# Patient Record
Sex: Female | Born: 1935 | Race: White | Hispanic: No | Smoking: Never smoker
Health system: Southern US, Community
[De-identification: ages and names within clinical notes are randomized; demographics above are authoritative.]

## PROBLEM LIST (undated history)

## (undated) DIAGNOSIS — I10 Essential (primary) hypertension: Secondary | ICD-10-CM

## (undated) DIAGNOSIS — E039 Hypothyroidism, unspecified: Secondary | ICD-10-CM

## (undated) DIAGNOSIS — R42 Dizziness and giddiness: Secondary | ICD-10-CM

## (undated) DIAGNOSIS — M810 Age-related osteoporosis without current pathological fracture: Secondary | ICD-10-CM

## (undated) HISTORY — PX: TUBAL LIGATION: SHX77

## (undated) HISTORY — PX: EYE SURGERY: SHX253

## (undated) HISTORY — PX: TONSILLECTOMY: SUR1361

---

## 1997-12-22 ENCOUNTER — Other Ambulatory Visit: Admission: RE | Admit: 1997-12-22 | Discharge: 1997-12-22 | Payer: Self-pay | Admitting: Obstetrics and Gynecology

## 1998-11-24 ENCOUNTER — Other Ambulatory Visit: Admission: RE | Admit: 1998-11-24 | Discharge: 1998-11-24 | Payer: Self-pay | Admitting: Internal Medicine

## 2000-01-17 ENCOUNTER — Other Ambulatory Visit: Admission: RE | Admit: 2000-01-17 | Discharge: 2000-01-17 | Payer: Self-pay | Admitting: Obstetrics and Gynecology

## 2000-10-24 ENCOUNTER — Other Ambulatory Visit: Admission: RE | Admit: 2000-10-24 | Discharge: 2000-10-24 | Payer: Self-pay | Admitting: Obstetrics and Gynecology

## 2001-07-25 ENCOUNTER — Ambulatory Visit (HOSPITAL_COMMUNITY): Admission: RE | Admit: 2001-07-25 | Discharge: 2001-07-25 | Payer: Self-pay | Admitting: Obstetrics and Gynecology

## 2002-02-17 ENCOUNTER — Other Ambulatory Visit: Admission: RE | Admit: 2002-02-17 | Discharge: 2002-02-17 | Payer: Self-pay | Admitting: Obstetrics and Gynecology

## 2003-03-05 ENCOUNTER — Other Ambulatory Visit: Admission: RE | Admit: 2003-03-05 | Discharge: 2003-03-05 | Payer: Self-pay | Admitting: Obstetrics and Gynecology

## 2004-04-02 HISTORY — PX: BACK SURGERY: SHX140

## 2004-10-12 ENCOUNTER — Ambulatory Visit: Payer: Self-pay | Admitting: Internal Medicine

## 2004-11-03 ENCOUNTER — Encounter: Admission: RE | Admit: 2004-11-03 | Discharge: 2004-11-03 | Payer: Self-pay | Admitting: Internal Medicine

## 2004-11-21 ENCOUNTER — Inpatient Hospital Stay (HOSPITAL_COMMUNITY): Admission: RE | Admit: 2004-11-21 | Discharge: 2004-11-22 | Payer: Self-pay | Admitting: Neurosurgery

## 2005-03-22 ENCOUNTER — Ambulatory Visit: Payer: Self-pay | Admitting: Internal Medicine

## 2005-04-06 ENCOUNTER — Encounter (INDEPENDENT_AMBULATORY_CARE_PROVIDER_SITE_OTHER): Payer: Self-pay | Admitting: Specialist

## 2005-04-06 ENCOUNTER — Ambulatory Visit: Payer: Self-pay | Admitting: Internal Medicine

## 2006-07-13 ENCOUNTER — Emergency Department (HOSPITAL_COMMUNITY): Admission: EM | Admit: 2006-07-13 | Discharge: 2006-07-14 | Payer: Self-pay | Admitting: Emergency Medicine

## 2006-12-24 ENCOUNTER — Inpatient Hospital Stay (HOSPITAL_COMMUNITY): Admission: EM | Admit: 2006-12-24 | Discharge: 2006-12-25 | Payer: Self-pay | Admitting: Emergency Medicine

## 2007-02-20 ENCOUNTER — Encounter: Admission: RE | Admit: 2007-02-20 | Discharge: 2007-02-20 | Payer: Self-pay | Admitting: Internal Medicine

## 2007-09-08 ENCOUNTER — Ambulatory Visit (HOSPITAL_COMMUNITY): Admission: RE | Admit: 2007-09-08 | Discharge: 2007-09-08 | Payer: Self-pay | Admitting: Obstetrics and Gynecology

## 2008-09-08 ENCOUNTER — Ambulatory Visit (HOSPITAL_COMMUNITY): Admission: RE | Admit: 2008-09-08 | Discharge: 2008-09-08 | Payer: Self-pay | Admitting: Obstetrics and Gynecology

## 2009-04-28 ENCOUNTER — Encounter: Admission: RE | Admit: 2009-04-28 | Discharge: 2009-04-28 | Payer: Self-pay | Admitting: Internal Medicine

## 2009-09-28 ENCOUNTER — Other Ambulatory Visit: Admission: RE | Admit: 2009-09-28 | Discharge: 2009-09-28 | Payer: Self-pay | Admitting: Radiology

## 2009-09-29 ENCOUNTER — Ambulatory Visit (HOSPITAL_COMMUNITY): Admission: RE | Admit: 2009-09-29 | Discharge: 2009-09-29 | Payer: Self-pay | Admitting: Obstetrics and Gynecology

## 2010-03-31 ENCOUNTER — Encounter: Payer: Self-pay | Admitting: Internal Medicine

## 2010-04-23 ENCOUNTER — Encounter: Payer: Self-pay | Admitting: Internal Medicine

## 2010-05-04 NOTE — Letter (Signed)
Summary: Colonoscopy Letter  [facility]  [address] [place], [place] [address]   Phone: [phone]  Fax: [phone]      March 31, 2010 MRN: [id number]   Breanna [name] [address] [place], [place]  [address]   Dear [name],   According to your medical record, it is time for you to schedule a Colonoscopy. The American Cancer Society recommends this procedure as a method to detect early colon cancer. Patients with a family history of colon cancer, or a personal history of colon polyps or inflammatory bowel disease are at increased risk.  This letter has been generated based on the recommendations made at the time of your procedure. If you feel that in your particular situation this may no longer apply, please contact our office.  Please call our office at [phone] to schedule this appointment or to update your records at your earliest convenience.  Thank you for cooperating with [place] to provide you with the very best care possible.   Sincerely,  Wilhemina Bonito. Marina Goodell, M.D.  [facility] [phone]

## 2010-08-15 NOTE — H&P (Signed)
[name], [name] NO.:  [id number]   MEDICAL RECORD NO.:  [id number]          PATIENT TYPE:  EMS   LOCATION:  ED                           FACILITY:  [facility]   PHYSICIAN:  Hollice Espy, M.D.DATE OF BIRTH:  06/10/1930   DATE OF ADMISSION:  12/24/2006  DATE OF DISCHARGE:                              HISTORY & PHYSICAL   PRIMARY CARE PHYSICIAN:  Breanna [name], M.D.   CHIEF COMPLAINT:  Abdominal pain.   HISTORY OF PRESENT ILLNESS:  The patient is a 77 year old, white female  with past medical history of hypertension, hypothyroidism and an episode  of what sounds to be pancreatitis about a year ago, although there are  no dictated records for this, who today was doing well and then had some  fast food which she had not had in a long time.  A few hours later, she  had severe, mid epigastric abdominal pain radiating to her back.  She  had some associated nausea and vomiting.  She came into the emergency  room and was found to have a white count of 18, normal cardiac markers  and a lipase level of 187.  It was suspected that she had acute  pancreatitis.  She was given medications for pain and nausea and since  then has started feeling better.  She now has only some mild nausea and  minimal abdominal discomfort.  She otherwise is doing well denying any  headache, vision changes, dysphagia, chest pain, palpitations, shortness  of breath, wheeze, cough, hematuria, dysuria, constipation, diarrhea,  focal extremity numbness, weakness or pain.  Review of systems otherwise  negative.  In regards to her previous history of pancreatitis, she  remembers them asking about whether she had gallbladder issues or drank  alcohol 1 year ago, but said they could not find a cause and it was, at  that time, much more self-limited.   PAST MEDICAL HISTORY:  1. Onset of pancreatitis, although this is not confirmed, 1 year ago.  2. Hypertension.  3. Hypothyroidism.  4.  Osteoporosis.   MEDICATIONS:  Actonel, Synthroid and a blood pressure pill she cannot  recall.   ALLERGIES:  SULFA.   SOCIAL HISTORY:  She denies any tobacco, heavy alcohol or drug use.   FAMILY HISTORY:  Noncontributory.   PHYSICAL EXAMINATION:  VITAL SIGNS:  Temperature 97.3, heart rate 129  down to 80, blood pressure 152/94, respirations 29, O2 saturations 98%  on room air.  GENERAL:  She is alert and oriented x3, in no distress.  HEENT:  Normocephalic, atraumatic.  Mucous membranes are dry.  She has  no carotid bruits.  HEART:  Regular rate and rhythm, S1, S2.  LUNGS:  Clear to auscultation bilaterally.  ABDOMEN:  Soft, mild generalized tenderness, mild distention, hypoactive  bowel sounds.  EXTREMITIES:  No clubbing, cyanosis or edema, trace pitting edema.   LABORATORY DATA AND X-RAY FINDINGS:  White count 18.1, 88% shift, H&H  13.1 and 38, MCV 95, platelet count 292.  Sodium 139, potassium 3.7,  chloride 102, bicarb 29, BUN 20, creatinine 0.9, glucose 144.  LFTs are  unremarkable.  Lipase  is 187.  CPK 113, MB 5.4, troponin I less than  0.05.  Second set was similar.   ASSESSMENT/PLAN:  Pancreatitis, unclear etiology.  CT scan has been  ordered in the emergency room and will follow up on this.  In addition,  will check serial white count and lipase in 9 hours.  Make the patient  nothing by mouth and put her on intravenous Protonix plus pain and  nausea control.  As far as etiology, I do not feel that this is  hyperlipidemia.  If it was, she would have presented much earlier.  There is no evidence of trauma.  She has not been on any type of new  medications.  She is not a drinker.  This may either be either  gallbladder related, although her Alk phos is normal, versus idiopathic.  We will continue to follow.      Hollice Espy, M.D.  Electronically Signed     SKK/MEDQ  D:  12/24/2006  T:  12/24/2006  Job:  [id number]   cc:   Breanna [name], M.D.  Fax: [phone]

## 2010-08-18 NOTE — Discharge Summary (Signed)
NAMERANDYE, TREICHLER NO.:  [id number]   MEDICAL RECORD NO.:  [id number]          PATIENT TYPE:  INP   LOCATION:  1329                         FACILITY:  [facility]   PHYSICIAN:  Theressa Millard, M.D.    DATE OF BIRTH:  04/19/1930   DATE OF ADMISSION:  12/24/2006  DATE OF DISCHARGE:  12/25/2006                               DISCHARGE SUMMARY   ADMISSION DIAGNOSIS:  Acute pancreatitis.   DISCHARGE DIAGNOSES:  1. Acute pancreatitis.  2. Hypertension.  3. Hypothyroidism care.  4. Osteoporosis.   HOSPITAL COURSE:  The patient is a 77 year old white female who had  sudden onset of abdominal pain and came to the emergency room.  She was  found to have an elevated lipase and was admitted.   HOSPITAL COURSE:  The patient was admitted and a CT scan and ultrasound  revealed no abnormality in the hepatic biliary tree.  There was no  evidence of pseudocyst or significant inflammation of the pancreas.  Overnight, she had remarkable improvement in her pain.  We were able to  advance her diet to clear liquids, full liquids and then regular diet in  the evening, and she was discharge later that evening in improved  condition.  Etiology of pancreatitis is unknown.   DISCHARGE MEDICATIONS:  1. Aspirin 81 mg daily.  2. Actonel 35 mg weekly.  3. Synthroid 88 mcg daily.  4. Lisinopril 10 mg daily.  5. Plendil ER 10 mg daily.   FOLLOWUP:  She will call to make an appointment to see me as scheduled.   DISCHARGE DIET:  Low-fat diet.   ACTIVITY:  As tolerated.      Theressa Millard, M.D.  Electronically Signed     JO/MEDQ  D:  02/05/2007  T:  02/05/2007  Job:  [id number]

## 2010-08-18 NOTE — Op Note (Signed)
Breanna [name], Breanna [name] NO.:  [id number]   MEDICAL RECORD NO.:  [id number]          PATIENT TYPE:  INP   LOCATION:  2854                         FACILITY:  [facility]   PHYSICIAN:  Kathaleen Maser. Pool, M.D.    DATE OF BIRTH:  04/15/1930   DATE OF PROCEDURE:  11/21/2004  DATE OF DISCHARGE:                                 OPERATIVE REPORT   PREOPERATIVE DIAGNOSES:  L2-3 herniated nucleus pulposus with radiculopathy.   POSTOPERATIVE DIAGNOSES:  L2-3 herniated nucleus pulposus with  radiculopathy.   OPERATION PERFORMED:  Right L2-3 laminotomy with microdiskectomy.   SURGEON:  Kathaleen Maser. Pool, M.D.   ASSISTANT:  Reinaldo Meeker, M.D.   ANESTHESIA:  General orotracheal.   INDICATIONS FOR PROCEDURE:  Breanna [name] is a 77 year old female with  history of back and right lower extremity pain, consistent with right-sided  L2-3 radiculopathy.  The patient also has evidence of right quadriceps  wasting and weakness.  Work-ups demonstrate evidence of a focal right-sided  L2-3 disk herniation and inferior free fragment causing compression of the  right-sided L3 nerve root.  The patient has been counseled as to her  options.  She has decided to proceed with a right-sided L2-3 laminotomy and  microdiskectomy in hopes of improving the symptoms.   DESCRIPTION OF PROCEDURE:  The patient was taken to the operating room and  placed on the table in the supine position.  After adequate level of  anesthesia was achieved, the patient was positioned prone onto a Wilson  frame and appropriately padded.  The patient's lumbar region was prepped and  draped sterilely.  A 10 blade was used to make a linear skin incision  overlying the L2-3 interspace.  This was carried down sharply in the  midline.  A subperiosteal dissection was then performed exposing the lamina  and facet joints of L2 and L3 on the right side.  Deep self-retaining  retractor was placed.  Intraoperative x-ray was taken, the level  was  confirmed.  The laminotomy was then performed using high speed drill and  Kerrison rongeurs to remove the inferior aspect of the lamina at L2, medial  aspect of L2-3 facet joint and superior rim of the L3 lamina.  Ligamentum  flavum was then elevated and resected in a piecemeal fashion using Kerrison  rongeurs.  The underlying thecal sac and exiting L3 nerve root were  identified.  The microscope was brought into the field and used for  microdissection of the right-sided L3 nerve root and underlying disk  herniation.  Epidural venous plexus was coagulated and cut.  Thecal sac and  L3 nerve root were mobilized and retracted towards the midline.  Inferior  free fragment was dissected free using micro nerve hooks and removed using  pituitary rongeurs.  The disk space itself was grossly herniated off to the  right side.  The disk was incised with a 15 blade in rectangular fashion.  A  wide disk space cleanout was then achieved using pituitary rongeurs and  upward angled pituitary rongeurs and Epstein curets.  All elements of the  disk herniation were completely resected.  All loose or obviously  degenerated disk material was removed from the interspace.  At this point a  very thorough diskectomy had been performed.  There was no evidence of  injury to thecal sac or nerve roots.  There was no evidence of any residual  compression of the thecal sac or nerve roots.  The wound was then irrigated  with antibiotic solution.  Gelfoam was placed topically for hemostasis which  was found to be good.  Microscope and  retractor system were removed.  Hemostasis in the muscle was achieved with  electrocautery.  The wound was then closed in layers with Vicryl sutures.  Steri-Strips and sterile dressing were applied.  There were no apparent  complications.  The patient tolerated the procedure well and returned to the  recovery room postoperatively.           ______________________________  Kathaleen Maser  Pool, M.D.     HAP/MEDQ  D:  11/21/2004  T:  11/21/2004  Job:  [id number]

## 2010-10-30 ENCOUNTER — Encounter (HOSPITAL_COMMUNITY)
Admission: RE | Admit: 2010-10-30 | Discharge: 2010-10-30 | Disposition: A | Discharge status: Home / Self Care | Payer: Medicare Other | Source: Ambulatory Visit | Attending: Obstetrics and Gynecology | Admitting: Obstetrics and Gynecology

## 2010-10-30 DIAGNOSIS — M810 Age-related osteoporosis without current pathological fracture: Secondary | ICD-10-CM | POA: Insufficient documentation

## 2011-01-11 LAB — CBC
HCT: 34.1 — ABNORMAL LOW
HCT: 38.1
Hemoglobin: 13.1
MCHC: 34.5
Platelets: 258
RBC: 3.99
RDW: 13.4
RDW: 13.4
WBC: 9.5

## 2011-01-11 LAB — URINE MICROSCOPIC-ADD ON

## 2011-01-11 LAB — COMPREHENSIVE METABOLIC PANEL
ALT: 20
Alkaline Phosphatase: 69
BUN: 20
CO2: 29
GFR calc non Af Amer: 59 — ABNORMAL LOW
Glucose, Bld: 144 — ABNORMAL HIGH
Potassium: 3.7
Sodium: 139
Total Protein: 7.1

## 2011-01-11 LAB — DIFFERENTIAL
Basophils Absolute: 0
Basophils Relative: 0
Eosinophils Absolute: 0.1
Monocytes Relative: 4
Neutro Abs: 15.9 — ABNORMAL HIGH
Neutrophils Relative %: 88 — ABNORMAL HIGH

## 2011-01-11 LAB — POCT CARDIAC MARKERS
CKMB, poc: 4.3
CKMB, poc: 5.4
Myoglobin, poc: 113
Myoglobin, poc: 188
Operator id: 4533
Operator id: 4761
Troponin i, poc: <0.05
Troponin i, poc: <0.05

## 2011-01-11 LAB — URINALYSIS, ROUTINE W REFLEX MICROSCOPIC
Glucose, UA: NEGATIVE
Ketones, ur: NEGATIVE
Nitrite: NEGATIVE
Specific Gravity, Urine: >1.046 — ABNORMAL HIGH
pH: 7.5

## 2011-01-11 LAB — LIPASE, BLOOD
Lipase: 187 — ABNORMAL HIGH
Lipase: 35

## 2011-04-10 DIAGNOSIS — Z1231 Encounter for screening mammogram for malignant neoplasm of breast: Secondary | ICD-10-CM | POA: Diagnosis not present

## 2011-05-14 DIAGNOSIS — H353190 Nonexudative age-related macular degeneration, unspecified eye, stage unspecified: Secondary | ICD-10-CM | POA: Diagnosis not present

## 2011-06-15 DIAGNOSIS — S239XXA Sprain of unspecified parts of thorax, initial encounter: Secondary | ICD-10-CM | POA: Diagnosis not present

## 2011-06-15 DIAGNOSIS — M999 Biomechanical lesion, unspecified: Secondary | ICD-10-CM | POA: Diagnosis not present

## 2011-06-19 DIAGNOSIS — M999 Biomechanical lesion, unspecified: Secondary | ICD-10-CM | POA: Diagnosis not present

## 2011-06-19 DIAGNOSIS — S239XXA Sprain of unspecified parts of thorax, initial encounter: Secondary | ICD-10-CM | POA: Diagnosis not present

## 2011-06-22 DIAGNOSIS — S239XXA Sprain of unspecified parts of thorax, initial encounter: Secondary | ICD-10-CM | POA: Diagnosis not present

## 2011-06-22 DIAGNOSIS — M999 Biomechanical lesion, unspecified: Secondary | ICD-10-CM | POA: Diagnosis not present

## 2011-06-25 DIAGNOSIS — M999 Biomechanical lesion, unspecified: Secondary | ICD-10-CM | POA: Diagnosis not present

## 2011-06-25 DIAGNOSIS — S239XXA Sprain of unspecified parts of thorax, initial encounter: Secondary | ICD-10-CM | POA: Diagnosis not present

## 2011-06-28 DIAGNOSIS — M999 Biomechanical lesion, unspecified: Secondary | ICD-10-CM | POA: Diagnosis not present

## 2011-06-28 DIAGNOSIS — S239XXA Sprain of unspecified parts of thorax, initial encounter: Secondary | ICD-10-CM | POA: Diagnosis not present

## 2011-07-03 DIAGNOSIS — M999 Biomechanical lesion, unspecified: Secondary | ICD-10-CM | POA: Diagnosis not present

## 2011-07-03 DIAGNOSIS — S239XXA Sprain of unspecified parts of thorax, initial encounter: Secondary | ICD-10-CM | POA: Diagnosis not present

## 2011-07-05 DIAGNOSIS — L659 Nonscarring hair loss, unspecified: Secondary | ICD-10-CM | POA: Diagnosis not present

## 2011-07-05 DIAGNOSIS — L570 Actinic keratosis: Secondary | ICD-10-CM | POA: Diagnosis not present

## 2011-07-10 DIAGNOSIS — M999 Biomechanical lesion, unspecified: Secondary | ICD-10-CM | POA: Diagnosis not present

## 2011-07-10 DIAGNOSIS — S239XXA Sprain of unspecified parts of thorax, initial encounter: Secondary | ICD-10-CM | POA: Diagnosis not present

## 2011-07-20 DIAGNOSIS — M999 Biomechanical lesion, unspecified: Secondary | ICD-10-CM | POA: Diagnosis not present

## 2011-07-20 DIAGNOSIS — S239XXA Sprain of unspecified parts of thorax, initial encounter: Secondary | ICD-10-CM | POA: Diagnosis not present

## 2011-08-21 DIAGNOSIS — E039 Hypothyroidism, unspecified: Secondary | ICD-10-CM | POA: Diagnosis not present

## 2011-08-21 DIAGNOSIS — Z0000 Encounter for general adult medical examination without abnormal findings: Secondary | ICD-10-CM | POA: Diagnosis not present

## 2011-08-21 DIAGNOSIS — I10 Essential (primary) hypertension: Secondary | ICD-10-CM | POA: Diagnosis not present

## 2011-08-21 DIAGNOSIS — Z1331 Encounter for screening for depression: Secondary | ICD-10-CM | POA: Diagnosis not present

## 2011-09-14 DIAGNOSIS — M810 Age-related osteoporosis without current pathological fracture: Secondary | ICD-10-CM | POA: Diagnosis not present

## 2011-10-02 DIAGNOSIS — M810 Age-related osteoporosis without current pathological fracture: Secondary | ICD-10-CM | POA: Diagnosis not present

## 2011-10-09 ENCOUNTER — Other Ambulatory Visit: Payer: Self-pay | Admitting: Obstetrics and Gynecology

## 2011-10-31 ENCOUNTER — Encounter (HOSPITAL_COMMUNITY)
Admission: RE | Admit: 2011-10-31 | Discharge: 2011-10-31 | Disposition: A | Discharge status: Home / Self Care | Payer: Medicare Other | Source: Ambulatory Visit | Attending: Obstetrics and Gynecology | Admitting: Obstetrics and Gynecology

## 2011-10-31 ENCOUNTER — Encounter (HOSPITAL_COMMUNITY): Payer: Self-pay

## 2011-10-31 DIAGNOSIS — H903 Sensorineural hearing loss, bilateral: Secondary | ICD-10-CM | POA: Diagnosis not present

## 2011-10-31 DIAGNOSIS — M810 Age-related osteoporosis without current pathological fracture: Secondary | ICD-10-CM | POA: Diagnosis not present

## 2011-10-31 HISTORY — DX: Hypothyroidism, unspecified: E03.9

## 2011-10-31 HISTORY — DX: Essential (primary) hypertension: I10

## 2011-10-31 HISTORY — DX: Age-related osteoporosis without current pathological fracture: M81.0

## 2011-10-31 MED ORDER — ZOLEDRONIC ACID 5 MG/100ML IV SOLN
5.0000 mg | Freq: Once | INTRAVENOUS | Status: AC
  Administered 2011-10-31: 5 mg via INTRAVENOUS
  Filled 2011-10-31: qty 100

## 2011-10-31 MED ORDER — SODIUM CHLORIDE 0.9 % IV SOLN
Freq: Once | INTRAVENOUS | Status: AC
  Administered 2011-10-31: 14:00:00 via INTRAVENOUS

## 2011-12-05 DIAGNOSIS — H353190 Nonexudative age-related macular degeneration, unspecified eye, stage unspecified: Secondary | ICD-10-CM | POA: Diagnosis not present

## 2011-12-21 ENCOUNTER — Encounter: Payer: Self-pay | Admitting: Internal Medicine

## 2012-01-18 DIAGNOSIS — Z23 Encounter for immunization: Secondary | ICD-10-CM | POA: Diagnosis not present

## 2012-01-30 DIAGNOSIS — M999 Biomechanical lesion, unspecified: Secondary | ICD-10-CM | POA: Diagnosis not present

## 2012-01-30 DIAGNOSIS — S239XXA Sprain of unspecified parts of thorax, initial encounter: Secondary | ICD-10-CM | POA: Diagnosis not present

## 2012-02-01 DIAGNOSIS — R42 Dizziness and giddiness: Secondary | ICD-10-CM

## 2012-02-01 HISTORY — DX: Dizziness and giddiness: R42

## 2012-02-08 DIAGNOSIS — M999 Biomechanical lesion, unspecified: Secondary | ICD-10-CM | POA: Diagnosis not present

## 2012-02-08 DIAGNOSIS — S239XXA Sprain of unspecified parts of thorax, initial encounter: Secondary | ICD-10-CM | POA: Diagnosis not present

## 2012-02-13 DIAGNOSIS — L570 Actinic keratosis: Secondary | ICD-10-CM | POA: Diagnosis not present

## 2012-02-13 DIAGNOSIS — L659 Nonscarring hair loss, unspecified: Secondary | ICD-10-CM | POA: Diagnosis not present

## 2012-02-16 ENCOUNTER — Observation Stay (HOSPITAL_COMMUNITY)
Admission: EM | Admit: 2012-02-16 | Discharge: 2012-02-18 | Disposition: A | Discharge status: Home / Self Care | Payer: Medicare Other | Attending: Internal Medicine | Admitting: Internal Medicine

## 2012-02-16 ENCOUNTER — Emergency Department (HOSPITAL_COMMUNITY): Payer: Medicare Other

## 2012-02-16 DIAGNOSIS — I10 Essential (primary) hypertension: Secondary | ICD-10-CM | POA: Diagnosis present

## 2012-02-16 DIAGNOSIS — M4802 Spinal stenosis, cervical region: Secondary | ICD-10-CM | POA: Diagnosis present

## 2012-02-16 DIAGNOSIS — M810 Age-related osteoporosis without current pathological fracture: Secondary | ICD-10-CM

## 2012-02-16 DIAGNOSIS — Z79899 Other long term (current) drug therapy: Secondary | ICD-10-CM | POA: Diagnosis not present

## 2012-02-16 DIAGNOSIS — R110 Nausea: Secondary | ICD-10-CM | POA: Diagnosis present

## 2012-02-16 DIAGNOSIS — I679 Cerebrovascular disease, unspecified: Secondary | ICD-10-CM | POA: Diagnosis not present

## 2012-02-16 DIAGNOSIS — G459 Transient cerebral ischemic attack, unspecified: Secondary | ICD-10-CM | POA: Diagnosis not present

## 2012-02-16 DIAGNOSIS — E039 Hypothyroidism, unspecified: Secondary | ICD-10-CM | POA: Diagnosis not present

## 2012-02-16 DIAGNOSIS — G319 Degenerative disease of nervous system, unspecified: Secondary | ICD-10-CM | POA: Insufficient documentation

## 2012-02-16 DIAGNOSIS — R42 Dizziness and giddiness: Principal | ICD-10-CM | POA: Diagnosis present

## 2012-02-16 DIAGNOSIS — R404 Transient alteration of awareness: Secondary | ICD-10-CM | POA: Diagnosis not present

## 2012-02-16 DIAGNOSIS — R112 Nausea with vomiting, unspecified: Secondary | ICD-10-CM | POA: Diagnosis not present

## 2012-02-16 LAB — DIFFERENTIAL
Eosinophils Absolute: 0 10*3/uL (ref 0.0–0.7)
Eosinophils Relative: 0 % (ref 0–5)
Lymphocytes Relative: 13 % (ref 12–46)
Lymphs Abs: 1.2 10*3/uL (ref 0.7–4.0)
Monocytes Relative: 9 % (ref 3–12)

## 2012-02-16 LAB — CBC
HCT: 33.3 % — ABNORMAL LOW (ref 36.0–46.0)
Hemoglobin: 11.7 g/dL — ABNORMAL LOW (ref 12.0–15.0)
MCH: 33.3 pg (ref 26.0–34.0)
MCV: 94.9 fL (ref 78.0–100.0)
RBC: 3.51 MIL/uL — ABNORMAL LOW (ref 3.87–5.11)

## 2012-02-16 LAB — POCT I-STAT, CHEM 8
BUN: 21 mg/dL (ref 6–23)
Calcium, Ion: 1.17 mmol/L (ref 1.13–1.30)
HCT: 34 % — ABNORMAL LOW (ref 36.0–46.0)
Sodium: 134 mEq/L — ABNORMAL LOW (ref 135–145)
TCO2: 22 mmol/L (ref 0–100)

## 2012-02-16 LAB — COMPREHENSIVE METABOLIC PANEL
Alkaline Phosphatase: 66 U/L (ref 39–117)
BUN: 21 mg/dL (ref 6–23)
CO2: 23 mEq/L (ref 19–32)
Calcium: 9.2 mg/dL (ref 8.4–10.5)
GFR calc Af Amer: >90 mL/min (ref >90)
GFR calc non Af Amer: 80 mL/min — ABNORMAL LOW (ref >90)
Glucose, Bld: 120 mg/dL — ABNORMAL HIGH (ref 70–99)
Total Protein: 6.9 g/dL (ref 6.0–8.3)

## 2012-02-16 NOTE — Code Documentation (Addendum)
Patient developed new onset dizziness and nausea at 1830. Code stroke called at 2046, patient arrived at 2037, EDP exam at 2045, stroke team arrived at 2050, [place] at 1830, patient arrived to CT at 2105, phlebotomist arrived at 2050, CT read by Dr. Amada Jupiter at 2110. Initial NIH 1 for ataxia. Code stroke cancelled at 2123 per Dr. Amada Jupiter

## 2012-02-16 NOTE — ED Notes (Signed)
Neuro In to evaluate pt

## 2012-02-16 NOTE — Consult Note (Signed)
Reason for Consult:vertigo Referring Physician: Rancour, S  CC: vertigo, nausea  History is obtained from:Patient, daughters  HPI: Breanna [name] is a 78 y.o. female with a history of htn who was in her normal state of health tonight until 6:30 pm at which time she had sudden onset nausea and vertigo. She states that she was abel to walk, but felt very unsteady. It is better if she lays completely still, and has improved some since onset.   LSN: 6:30pm tpa given: no, mild symptoms  ROS: A 14 point ROS was performed and is negative except as noted in the HPI.  Past Medical History  Diagnosis Date  . Hypertension   . Osteoporosis, senile     post menopausal  . Hypothyroidism     Family History: Mother -cad Father -cva   Social History: Tob: denies EtOH - 4 glasses of wine/week  Exam: Current vital signs: BP 136/56  Pulse 91  Temp 97.3 F (36.3 C) (Oral)  Resp 13  SpO2 100% Vital signs in last 24 hours: Temp:  [97.3 F (36.3 C)-97.7 F (36.5 C)] 97.3 F (36.3 C) (11/16 2152) Pulse Rate:  [82-103] 91  (11/16 2145) Resp:  [13-24] 13  (11/16 2145) BP: (136-150)/(53-62) 136/56 mmHg (11/16 2145) SpO2:  [98 %-100 %] 100 % (11/16 2145)  General: in bed, nad CV: RRR Mental Status: Patient is awake, alert, oriented to person, place, month, year, and situation. Immediate and remote memory are intact. Patient is able to give a clear and coherent history. Able to spell world backwards without difficulty Cranial Nerves: II: Visual Fields are full. Pupils are equal, round, and reactive to light.  Discs are difficult to visualize. III,IV, VI: EOMI without ptosis or diploplia. There is no nystagmus and saccades are normal.  V: Facial sensation is symmetric to temperature VII: Facial movement is symmetric.  VIII: hearing is intact to voice X: Uvula elevates symmetrically XI: Shoulder shrug is symmetric. XII: tongue is midline without atrophy or fasciculations.    Motor: Tone is normal. Bulk is normal. 5/5 strength was present in all four extremities.  Sensory: Sensation is symmetric to light touch and temperature in the arms and legs. Deep Tendon Reflexes: 2+ and symmetric in the biceps and patellae.  Cerebellar: HKS are intact bilaterally. She has intentional tremor bilaterally, but in addition has some passpointing on FNF on the right.  Gait: Did not assess due to patient safety concerns.   I have reviewed labs in epic and the results pertinent to this consultation are: BMP, CBC unremarkable.   I have reviewed the images obtained:CT head - no hemorrhage  Impression: 78 yo F with sudden onset vertigo/nausea eralier tonight. It is possible that she has had a small cerebellar infarct, especially given the acute nature of the onset. Her symptoms are greatly improved at this point, so it may be a TIA, but I suspect a small infarct. She will need to be admitted for a stroke workup.   Recommendations: 1. HgbA1c, fasting lipid panel 2. MRI, MRA  of the brain without contrast. MRA neck with contrast 3. PT consult, OT consult if MRI is positive 4. Echocardiogram 5. Nursing swallow screen prior to PO intake.  6. Prophylactic therapy-Antiplatelet med: Aspirin - dose 325mg  7. Risk factor modification 8. Telemetry monitoring 9. Frequent neuro checks   Ritta Slot, MD [facility] [phone]  If 7pm- 7am, please page neurology on call at [phone].

## 2012-02-16 NOTE — ED Provider Notes (Signed)
History     CSN: [id number]  Arrival date & time 02/16/12  2037   First MD Initiated Contact with Patient 02/16/12 2045      Chief Complaint  Patient presents with  . Dizziness    (Consider location/radiation/quality/duration/timing/severity/associated sxs/prior treatment) HPI Comments: The patient was in the kitchen at 6:30 today just sudden onset spinning sensation. It has been waxing and waning since then but has not entirely resolved. She received Zofran for nausea in route and she states this improved her nausea in mildly improved her vertigo. She states she had one episode similar to this 15-20 years ago. She's never had a stroke. Denies any headache, vision change, weakness, paresthesias.  Patient is a 78 y.o. female presenting with neurologic complaint. The history is provided by the patient and the EMS personnel. No language interpreter was used.  Neurologic Problem The primary symptoms include dizziness (vertigo) and nausea. Primary symptoms do not include headaches, syncope, loss of consciousness, focal weakness, fever or vomiting. The symptoms began 1 to 2 hours ago. The symptoms are improving. Context: while in the kitchen.  She describes the dizziness as a sensation of spinning. The dizziness began today. The dizziness has been gradually improving since its onset. It is a recurrent (last episode 15-20 yrs ago) problem. Dizziness also occurs with nausea. Dizziness does not occur with vomiting or weakness.  Additional symptoms include vertigo. Additional symptoms do not include neck stiffness, weakness or photophobia. Medical issues do not include cerebral vascular accident.    Past Medical History  Diagnosis Date  . Hypertension   . Osteoporosis, senile     post menopausal  . Hypothyroidism     Past Surgical History  Procedure Date  . Back surgery 2006    No family history on file.  History  Substance Use Topics  . Smoking status: Never Smoker   . Smokeless  tobacco: Not on file  . Alcohol Use: Yes     Comment: socially drinks wine    OB History    Grav Para Term Preterm Abortions TAB SAB Ect Mult Living                  Review of Systems  Constitutional: Negative for fever, chills, activity change and appetite change.  HENT: Negative for congestion, rhinorrhea, neck pain, neck stiffness and sinus pressure.   Eyes: Negative for photophobia, discharge and visual disturbance.  Respiratory: Negative for cough, chest tightness, shortness of breath, wheezing and stridor.   Cardiovascular: Negative for chest pain, leg swelling and syncope.  Gastrointestinal: Positive for nausea. Negative for vomiting, abdominal pain, diarrhea and abdominal distention.  Genitourinary: Negative for decreased urine volume and difficulty urinating.  Musculoskeletal: Negative for back pain and arthralgias.  Skin: Negative for color change and pallor.  Neurological: Positive for dizziness (vertigo) and vertigo. Negative for focal weakness, loss of consciousness, weakness, light-headedness and headaches.  Psychiatric/Behavioral: Negative for behavioral problems and agitation.  All other systems reviewed and are negative.    Allergies  Sulfa antibiotics  Home Medications   Current Outpatient Rx  Name  Route  Sig  Dispense  Refill  . FELODIPINE ER 10 MG PO TB24   Oral   Take 10 mg by mouth daily.         Marland Kitchen LEVOTHYROXINE SODIUM 88 MCG PO TABS   Oral   Take 88 mcg by mouth daily.         Marland Kitchen LISINOPRIL PO   Oral   Take 10 mg  by mouth daily.           BP 148/62  Pulse 97  Temp 97.7 F (36.5 C) (Oral)  Resp 24  SpO2 100%  Physical Exam  Nursing note and vitals reviewed. Constitutional: She is oriented to person, place, and time. She appears well-developed and well-nourished. No distress.  HENT:  Head: Normocephalic and atraumatic.  Mouth/Throat: No oropharyngeal exudate.  Eyes: EOM are normal. Pupils are equal, round, and reactive to light.  Right eye exhibits no discharge. Left eye exhibits no discharge.  Neck: Normal range of motion. Neck supple. No JVD present.  Cardiovascular: Normal rate, regular rhythm and normal heart sounds.   Pulmonary/Chest: Effort normal and breath sounds normal. No stridor. No respiratory distress. She exhibits no tenderness.  Abdominal: Soft. Bowel sounds are normal. She exhibits no distension. There is no tenderness. There is no guarding.  Musculoskeletal: Normal range of motion. She exhibits no edema and no tenderness.  Neurological: She is alert and oriented to person, place, and time. She has normal reflexes. She displays normal reflexes. No cranial nerve deficit. She exhibits normal muscle tone. Coordination normal.       Dix-Hallpike negative but when she sits up she states it gets worse. No nystagmus.  Skin: Skin is warm and dry. No rash noted. She is not diaphoretic.  Psychiatric: She has a normal mood and affect. Her behavior is normal. Judgment and thought content normal.    ED Course  Procedures (including critical care time)  Labs Reviewed  CBC - Abnormal; Notable for the following:    RBC 3.51 (*)     Hemoglobin 11.7 (*)     HCT 33.3 (*)     All other components within normal limits  DIFFERENTIAL - Abnormal; Notable for the following:    Neutrophils Relative 78 (*)     All other components within normal limits  COMPREHENSIVE METABOLIC PANEL - Abnormal; Notable for the following:    Sodium 133 (*)     Glucose, Bld 120 (*)     GFR calc non Af Amer 80 (*)     All other components within normal limits  POCT I-STAT, CHEM 8 - Abnormal; Notable for the following:    Sodium 134 (*)     Glucose, Bld 117 (*)     Hemoglobin 11.6 (*)     HCT 34.0 (*)     All other components within normal limits  PROTIME-INR  APTT  TROPONIN I  URINE RAPID DRUG SCREEN (HOSP PERFORMED)   Ct Head Wo Contrast  02/16/2012  *RADIOLOGY REPORT*  Clinical Data: Acute onset vertigo.  CT HEAD WITHOUT CONTRAST   Technique:  Contiguous axial images were obtained from the base of the skull through the vertex without contrast.  Comparison: None.  Findings: There is no evidence for acute infarction, intracranial hemorrhage, mass lesion, hydrocephalus, or extra-axial fluid.  Mild atrophy.  Mild chronic microvascular ischemic change.  Intact calvarium.  Clear sinuses and mastoids.  IMPRESSION: No acute intracranial findings.  Chronic changes of atrophy and small vessel disease.  Critical Value/emergent results were called by telephone at the time of interpretation on 02/16/2012 at 920 PM to EDP Rancour, who verbally acknowledged these results.   Original Report Authenticated By: Davonna Belling, M.D.    1. Vertigo      Date: 02/16/2012  Rate: 93  Rhythm: normal sinus rhythm  QRS Axis: normal  Intervals: normal  ST/T Wave abnormalities: normal  Conduction Disutrbances: none  Narrative Interpretation: nml  Old EKG Reviewed: none     MDM  8:54 PM code stroke called due to acute onset of vertigo and nausea at 630pm. No other sxs. Improved now since she got zofran en route. Exam nml, DHT neg but she states her vertigo worsens when she sits up. May be peripheral but unsure at this time, will get [facility] to r/o bleed first. Neuro at bedside  11:10 PM admitted to medicine in stable condition. To get MR in AM along w/ remainder of stroke w/u. Stable at this time. Pt states she feels better and is currently asymptomatic.       Warrick Parisian, MD 02/16/12 [phone]

## 2012-02-16 NOTE — ED Notes (Signed)
Pt to CT scan with MD and RN

## 2012-02-16 NOTE — ED Notes (Signed)
ED MD x 2 at bedside for evaluation

## 2012-02-16 NOTE — ED Notes (Signed)
Pt states she had a sudden episode of dizziness at 1830. States that she was nauseated and felt like the room was spinning

## 2012-02-16 NOTE — ED Notes (Signed)
Pt denies having any pain or being dizzy at present.

## 2012-02-17 ENCOUNTER — Encounter (HOSPITAL_COMMUNITY): Payer: Self-pay | Admitting: Internal Medicine

## 2012-02-17 ENCOUNTER — Observation Stay (HOSPITAL_COMMUNITY): Payer: Medicare Other

## 2012-02-17 ENCOUNTER — Other Ambulatory Visit (HOSPITAL_COMMUNITY): Payer: Medicare Other

## 2012-02-17 DIAGNOSIS — R110 Nausea: Secondary | ICD-10-CM | POA: Diagnosis not present

## 2012-02-17 DIAGNOSIS — I10 Essential (primary) hypertension: Secondary | ICD-10-CM | POA: Diagnosis not present

## 2012-02-17 DIAGNOSIS — R42 Dizziness and giddiness: Secondary | ICD-10-CM | POA: Diagnosis not present

## 2012-02-17 DIAGNOSIS — M810 Age-related osteoporosis without current pathological fracture: Secondary | ICD-10-CM

## 2012-02-17 DIAGNOSIS — E039 Hypothyroidism, unspecified: Secondary | ICD-10-CM | POA: Diagnosis not present

## 2012-02-17 DIAGNOSIS — I6529 Occlusion and stenosis of unspecified carotid artery: Secondary | ICD-10-CM | POA: Diagnosis not present

## 2012-02-17 DIAGNOSIS — G459 Transient cerebral ischemic attack, unspecified: Secondary | ICD-10-CM

## 2012-02-17 LAB — LIPID PANEL
Cholesterol: 188 mg/dL (ref 0–200)
HDL: 81 mg/dL (ref >39)
LDL Cholesterol: 99 mg/dL (ref 0–99)
Total CHOL/HDL Ratio: 2.3 RATIO
Triglycerides: 40 mg/dL (ref <150)
VLDL: 8 mg/dL (ref 0–40)

## 2012-02-17 LAB — GLUCOSE, CAPILLARY: Glucose-Capillary: 93 mg/dL (ref 70–99)

## 2012-02-17 LAB — RAPID URINE DRUG SCREEN, HOSP PERFORMED
Barbiturates: NOT DETECTED
Benzodiazepines: NOT DETECTED

## 2012-02-17 MED ORDER — ASPIRIN 325 MG PO TABS
325.0000 mg | ORAL_TABLET | Freq: Every day | ORAL | Status: DC
  Administered 2012-02-17 – 2012-02-18 (×2): 325 mg via ORAL
  Filled 2012-02-17 (×2): qty 1

## 2012-02-17 MED ORDER — LEVOTHYROXINE SODIUM 88 MCG PO TABS
88.0000 ug | ORAL_TABLET | Freq: Every day | ORAL | Status: DC
  Administered 2012-02-18: 88 ug via ORAL
  Filled 2012-02-17 (×2): qty 1

## 2012-02-17 MED ORDER — FELODIPINE ER 10 MG PO TB24
10.0000 mg | ORAL_TABLET | Freq: Every day | ORAL | Status: DC
  Administered 2012-02-17: 10 mg via ORAL
  Filled 2012-02-17 (×2): qty 1

## 2012-02-17 MED ORDER — LISINOPRIL 10 MG PO TABS
10.0000 mg | ORAL_TABLET | Freq: Every day | ORAL | Status: DC
  Administered 2012-02-18: 10 mg via ORAL
  Filled 2012-02-17: qty 1

## 2012-02-17 MED ORDER — ONDANSETRON HCL 4 MG/2ML IJ SOLN: 4.0000 mg | Freq: Three times a day (TID) | INTRAMUSCULAR | Status: AC | PRN

## 2012-02-17 MED ORDER — GADOBENATE DIMEGLUMINE 529 MG/ML IV SOLN
10.0000 mL | Freq: Once | INTRAVENOUS | Status: AC | PRN
  Administered 2012-02-17: 10 mL via INTRAVENOUS

## 2012-02-17 MED ORDER — ENOXAPARIN SODIUM 40 MG/0.4ML ~~LOC~~ SOLN
40.0000 mg | SUBCUTANEOUS | Status: DC
  Administered 2012-02-17 – 2012-02-18 (×2): 40 mg via SUBCUTANEOUS
  Filled 2012-02-17 (×2): qty 0.4

## 2012-02-17 NOTE — Progress Notes (Signed)
*  PRELIMINARY RESULTS* Vascular Ultrasound Carotid Duplex (Doppler) has been completed.  Preliminary findings: Bilateral:  No evidence of hemodynamically significant internal carotid artery stenosis.   Vertebral artery flow is antegrade.      Farrel Demark, RDMS, RVT 02/17/2012, 10:43 AM

## 2012-02-17 NOTE — Progress Notes (Signed)
Subjective: Breanna [name] is feeling much better today.  Nausea and vertigo has resolved.  She states she has been having intermittent symptoms over the last couple weeks and she seems to have a sensation in her lower posterior neck and upper back that precedes these symptoms.  No obvious seizure activity.  She is going down for MRI now and has 2-D echo and carotid Dopplers scheduled.  She is thankful for the workup  Objective: Weight change:   Intake/Output Summary (Last 24 hours) at 02/17/12 0913 Last data filed at 02/17/12 0700  Gross per 24 hour  Intake    240 ml  Output    500 ml  Net   -260 ml   Filed Vitals:   02/16/12 2250 02/17/12 0125 02/17/12 0400 02/17/12 0600  BP: 128/61 139/63 102/37 114/60  Pulse: 92 89 68 82  Temp: 98.5 F (36.9 C) 98.2 F (36.8 C) 97.6 F (36.4 C) 98 F (36.7 C)  TempSrc: Oral Oral    Resp: 16 20 16 16   Height:  5' (1.524 m)    Weight:  54.8 kg (120 lb 13 oz)    SpO2: 100% 97% 99% 99%   General Appearance: Alert, cooperative, no distress, appears stated age Head: Normocephalic, without obvious abnormality, atraumatic Neck: Supple, symmetrical, no bruits Lungs: Clear to auscultation bilaterally, respirations unlabored Heart: Regular rate and rhythm, S1 and S2 normal, rub or gallop.  1/6 systolic ejection murmur over the aortic area consistent with aortic sclerosis.  We'll be checking 2-D echo Abdomen: Soft, non-tender, bowel sounds active all four quadrants, no masses, no organomegaly Extremities: Extremities normal, atraumatic, no cyanosis or edema Pulses: 2+ and symmetric all extremities Skin: Skin color, texture, turgor normal, no rashes or lesions Neuro: CNII-XII intact. .  No nystagmus Normal strength, sensation and reflexes throughout.  Finger to nose is normal this morning.               Did not check heel-to-shin   Lab Results:  [facility] 02/16/12 2120 02/16/12 2104  NA 134* 133*  K 4.1 4.1  CL 104 98  CO2 -- 23  GLUCOSE 117*  120*  BUN 21 21  CREATININE 0.90 0.72  CALCIUM -- 9.2  MG -- --  PHOS -- --    Basename 02/16/12 2104  AST 30  ALT 18  ALKPHOS 66  BILITOT 0.3  PROT 6.9  ALBUMIN 4.0   No results found for this basename: LIPASE:2,AMYLASE:2 in the last 72 hours  Basename 02/16/12 2120 02/16/12 2104  WBC -- 9.6  NEUTROABS -- 7.5  HGB 11.6* 11.7*  HCT 34.0* 33.3*  MCV -- 94.9  PLT -- 279   No results found for this basename: CKTOTAL:3,CKMB:3,CKMBINDEX:3,TROPONINI:3 in the last 72 hours No components found with this basename: POCBNP:3 No results found for this basename: DDIMER:2 in the last 72 hours No results found for this basename: HGBA1C:2 in the last 72 hours  Basename 02/17/12 0515  CHOL 188  HDL 81  LDLCALC 99  TRIG 40  CHOLHDL 2.3  LDLDIRECT --   No results found for this basename: TSH,T4TOTAL,FREET3,T3FREE,THYROIDAB in the last 72 hours No results found for this basename: VITAMINB12:2,FOLATE:2,FERRITIN:2,TIBC:2,IRON:2,RETICCTPCT:2 in the last 72 hours  Studies/Results: Ct Head Wo Contrast  02/16/2012  *RADIOLOGY REPORT*  Clinical Data: Acute onset vertigo.  CT HEAD WITHOUT CONTRAST  Technique:  Contiguous axial images were obtained from the base of the skull through the vertex without contrast.  Comparison: None.  Findings: There is no evidence for  acute infarction, intracranial hemorrhage, mass lesion, hydrocephalus, or extra-axial fluid.  Mild atrophy.  Mild chronic microvascular ischemic change.  Intact calvarium.  Clear sinuses and mastoids.  IMPRESSION: No acute intracranial findings.  Chronic changes of atrophy and small vessel disease.  Critical Value/emergent results were called by telephone at the time of interpretation on 02/16/2012 at 920 PM to EDP Rancour, who verbally acknowledged these results.   Original Report Authenticated By: Davonna Belling, M.D.    Medications: Scheduled Meds:   . aspirin  325 mg Oral Daily  . enoxaparin  40 mg Subcutaneous Q24H   Continuous  Infusions:  PRN Meds:.ondansetron (ZOFRAN) IV  Assessment/Plan:  Principal Problem:  TIA (transient ischemic attack) versus positional vertigo most likely.  She did have a 5 beat run of V. tach earlier this morning and we'll continue to monitor.  MRI and carotid Dopplers and 2-D echo pending Active Problems:  Vertigo - episodic symptoms of the last several weeks.  Could be vascular in nature. Hypertension - controlled Hypothyroid - aware Nausea - resolved, possibly secondary to in her ear instability/BPV or an intracranial process  Plan:  Continue Telemetry Bed Observation Status - continue to monitor for V. tach TIA Workup, Neuro Checks  MRI/MRA today as well as Carotid US, 2 D ECHO   DVT Prophylaxis May ambulate in halls with assistance only - and must remain on telemetry Able to travel off telemetry for procedures      LOS: 1 day   Bomani Oommen NEVILL 02/17/2012, 9:13 AM

## 2012-02-17 NOTE — Progress Notes (Signed)
Stroke Team Progress Note  HISTORY Breanna [name] is a 78 y.o. female with a history of htn who was in her normal state of health tonight until 6:30 pm at which time she had sudden onset nausea and vertigo. She states that she was abel to walk, but felt very unsteady. It is better if she lays completely still, and has improved some since onset.    SUBJECTIVE Her husband and family is at the bedside. Overall she feels her condition is improved. Intermittent events of muffled hearing. Some pressure sensations in the neck. Vertigo is gone.  OBJECTIVE Most recent Vital Signs: Temp: 98.1 F (36.7 C) (11/17 1000) Temp src: Oral (11/17 0125) BP: 117/53 mmHg (11/17 1000) Pulse Rate: 82  (11/17 1000) Respiratory Rate: 18 O2 Saturation: 97%  CBG (last 3)  Basename 02/17/12 1141 02/17/12 0730  GLUCAP 92 93   Intake/Output from previous day: 11/16 0701 - 11/17 0700 In: 240 [P.O.:240] Out: 500 [Urine:500]  IV Fluid Intake:    Medications    . aspirin  325 mg Oral Daily  . enoxaparin  40 mg Subcutaneous Q24H  PRN [COMPLETED] gadobenate dimeglumine, ondansetron (ZOFRAN) IV  Diet:  Cardiac thin liquids Activity: Up with assistance DVT Prophylaxis:  Lovenox  Significant Diagnostic Studies: CBC    Component Value Date/Time   WBC 9.6 02/16/2012 2104   RBC 3.51* 02/16/2012 2104   HGB 11.6* 02/16/2012 2120   HCT 34.0* 02/16/2012 2120   PLT 279 02/16/2012 2104   MCV 94.9 02/16/2012 2104   MCH 33.3 02/16/2012 2104   MCHC 35.1 02/16/2012 2104   RDW 14.3 02/16/2012 2104   LYMPHSABS 1.2 02/16/2012 2104   MONOABS 0.9 02/16/2012 2104   EOSABS 0.0 02/16/2012 2104   BASOSABS 0.0 02/16/2012 2104   CMP    Component Value Date/Time   NA 134* 02/16/2012 2120   K 4.1 02/16/2012 2120   CL 104 02/16/2012 2120   CO2 23 02/16/2012 2104   GLUCOSE 117* 02/16/2012 2120   BUN 21 02/16/2012 2120   CREATININE 0.90 02/16/2012 2120   CALCIUM 9.2 02/16/2012 2104   PROT 6.9 02/16/2012 2104    ALBUMIN 4.0 02/16/2012 2104   AST 30 02/16/2012 2104   ALT 18 02/16/2012 2104   ALKPHOS 66 02/16/2012 2104   BILITOT 0.3 02/16/2012 2104   GFRNONAA 80* 02/16/2012 2104   GFRAA >90 02/16/2012 2104   COAGS Lab Results  Component Value Date   INR 1.09 02/16/2012   Lipid Panel    Component Value Date/Time   CHOL 188 02/17/2012 0515   TRIG 40 02/17/2012 0515   HDL 81 02/17/2012 0515   CHOLHDL 2.3 02/17/2012 0515   VLDL 8 02/17/2012 0515   LDLCALC 99 02/17/2012 0515   HgbA1C  No results found for this basename: HGBA1C   Urine Drug Screen    Component Value Date/Time   LABOPIA NONE DETECTED 02/17/2012 0419   COCAINSCRNUR NONE DETECTED 02/17/2012 0419   LABBENZ NONE DETECTED 02/17/2012 0419   AMPHETMU NONE DETECTED 02/17/2012 0419   THCU NONE DETECTED 02/17/2012 0419   LABBARB NONE DETECTED 02/17/2012 0419    Alcohol Level No results found for this basename: eth     Results for orders placed during the hospital encounter of 02/16/12 (from the past 24 hour(s))  PROTIME-INR     Status: Normal   Collection Time   02/16/12  9:04 PM      Component Value Range   Prothrombin Time 14.0  11.6 - 15.2 seconds  INR 1.09  0.00 - 1.49  APTT     Status: Normal   Collection Time   02/16/12  9:04 PM      Component Value Range   aPTT 33  24 - 37 seconds  CBC     Status: Abnormal   Collection Time   02/16/12  9:04 PM      Component Value Range   WBC 9.6  4.0 - 10.5 K/uL   RBC 3.51 (*) 3.87 - 5.11 MIL/uL   Hemoglobin 11.7 (*) 12.0 - 15.0 g/dL   HCT 40.9 (*) 81.1 - 91.4 %   MCV 94.9  78.0 - 100.0 fL   MCH 33.3  26.0 - 34.0 pg   MCHC 35.1  30.0 - 36.0 g/dL   RDW 78.2  95.6 - 21.3 %   Platelets 279  150 - 400 K/uL  DIFFERENTIAL     Status: Abnormal   Collection Time   02/16/12  9:04 PM      Component Value Range   Neutrophils Relative 78 (*) 43 - 77 %   Neutro Abs 7.5  1.7 - 7.7 K/uL   Lymphocytes Relative 13  12 - 46 %   Lymphs Abs 1.2  0.7 - 4.0 K/uL   Monocytes Relative 9   3 - 12 %   Monocytes Absolute 0.9  0.1 - 1.0 K/uL   Eosinophils Relative 0  0 - 5 %   Eosinophils Absolute 0.0  0.0 - 0.7 K/uL   Basophils Relative 0  0 - 1 %   Basophils Absolute 0.0  0.0 - 0.1 K/uL  COMPREHENSIVE METABOLIC PANEL     Status: Abnormal   Collection Time   02/16/12  9:04 PM      Component Value Range   Sodium 133 (*) 135 - 145 mEq/L   Potassium 4.1  3.5 - 5.1 mEq/L   Chloride 98  96 - 112 mEq/L   CO2 23  19 - 32 mEq/L   Glucose, Bld 120 (*) 70 - 99 mg/dL   BUN 21  6 - 23 mg/dL   Creatinine, Ser 0.86  0.50 - 1.10 mg/dL   Calcium 9.2  8.4 - 57.8 mg/dL   Total Protein 6.9  6.0 - 8.3 g/dL   Albumin 4.0  3.5 - 5.2 g/dL   AST 30  0 - 37 U/L   ALT 18  0 - 35 U/L   Alkaline Phosphatase 66  39 - 117 U/L   Total Bilirubin 0.3  0.3 - 1.2 mg/dL   GFR calc non Af Amer 80 (*) >90 mL/min   GFR calc Af Amer >90  >90 mL/min  POCT I-STAT, CHEM 8     Status: Abnormal   Collection Time   02/16/12  9:20 PM      Component Value Range   Sodium 134 (*) 135 - 145 mEq/L   Potassium 4.1  3.5 - 5.1 mEq/L   Chloride 104  96 - 112 mEq/L   BUN 21  6 - 23 mg/dL   Creatinine, Ser 4.69  0.50 - 1.10 mg/dL   Glucose, Bld 629 (*) 70 - 99 mg/dL   Calcium, Ion 5.28  4.13 - 1.30 mmol/L   TCO2 22  0 - 100 mmol/L   Hemoglobin 11.6 (*) 12.0 - 15.0 g/dL   HCT 24.4 (*) 01.0 - 27.2 %  URINE RAPID DRUG SCREEN (HOSP PERFORMED)     Status: Normal   Collection Time   02/17/12  4:19 AM  Component Value Range   Opiates NONE DETECTED  NONE DETECTED   Cocaine NONE DETECTED  NONE DETECTED   Benzodiazepines NONE DETECTED  NONE DETECTED   Amphetamines NONE DETECTED  NONE DETECTED   Tetrahydrocannabinol NONE DETECTED  NONE DETECTED   Barbiturates NONE DETECTED  NONE DETECTED  LIPID PANEL     Status: Normal   Collection Time   02/17/12  5:15 AM      Component Value Range   Cholesterol 188  0 - 200 mg/dL   Triglycerides 40  <960 mg/dL   HDL 81  >45 mg/dL   Total CHOL/HDL Ratio 2.3     VLDL 8  0 -  40 mg/dL   LDL Cholesterol 99  0 - 99 mg/dL  GLUCOSE, CAPILLARY     Status: Normal   Collection Time   02/17/12  7:30 AM      Component Value Range   Glucose-Capillary 93  70 - 99 mg/dL  GLUCOSE, CAPILLARY     Status: Normal   Collection Time   02/17/12 11:41 AM      Component Value Range   Glucose-Capillary 92  70 - 99 mg/dL    Ct Head Wo Contrast  02/16/2012  *RADIOLOGY REPORT*  Clinical Data: Acute onset vertigo.  CT HEAD WITHOUT CONTRAST  Technique:  Contiguous axial images were obtained from the base of the skull through the vertex without contrast.  Comparison: None.  Findings: There is no evidence for acute infarction, intracranial hemorrhage, mass lesion, hydrocephalus, or extra-axial fluid.  Mild atrophy.  Mild chronic microvascular ischemic change.  Intact calvarium.  Clear sinuses and mastoids.  IMPRESSION: No acute intracranial findings.  Chronic changes of atrophy and small vessel disease.  Critical Value/emergent results were called by telephone at the time of interpretation on 02/16/2012 at 920 PM to EDP Rancour, who verbally acknowledged these results.   Original Report Authenticated By: Davonna Belling, M.D.    Mr Angiogram Neck W Wo Contrast  02/17/2012  *RADIOLOGY REPORT*  Clinical Data:  Hypotension.  Sudden onset of severe dizziness and nausea.  MRI BRAIN WITH AND WITHOUT CONTRAST MRA HEAD WITHOUT CONTRAST MRA NECK WITHOUT AND WITH CONTRAST  Technique: Multiplanar, multiecho pulse sequences of the brain and surrounding structures were obtained according to standard protocol with and without intravenous contrast.  Angiographic images of the Circle of [place] Andreoni were obtained using MRA technique without intravenous contrast.  Angiographic images of the neck were obtained using MRA technique without and with intravenous contrast.  Contrast:10 ml MultiHance.  Comparison:02/16/2012 head CT.  No comparison brain MR.  MRI HEAD  Findings: Diffusion sequence is limited secondary to the dental  artifact. Probable artifact posterior right opercular region (series 4 image 19).  No acute infarct detected.  Mild to moderate small vessel disease type changes.  No intracranial mass.  Incidentally noted is small developmental venous anomaly right cerebellum.  Atrophy without hydrocephalus.  Cervical spondylotic changes.  Mild transverse ligament hypertrophy.  Spinal stenosis of the cervical spine.  IMPRESSION: Artifact limits diffusion sequence.  No acute infarct noted.  Small vessel disease type changes.  Spinal stenosis upper cervical spine.  MRA HEAD  Findings:Right internal carotid artery slightly larger than left with mild ectasia pre cavernous and cavernous segment of the right internal carotid artery.  Hypoplastic A1 segment of the left anterior cerebral artery otherwise anterior circulation without medium or large size vessel significant stenosis or occlusion.  Mild middle cerebral artery branch vessel irregularity.  Vertebral arteries and proximal basilar  artery better delineated on MR angiogram of the neck.  Poor delineation PICAs.  Nonvisualization AICAs.  Posterior cerebral artery distal branch vessel mild irregularity.  Superior cerebellar artery mild to moderate narrowing.  IMPRESSION: Intracranial atherosclerotic type changes most notable involving branch vessels as detailed above.  MRA NECK  Findings:Normal configuration of the origin of the great vessels from the aortic arch.  No significant stenosis of the subclavian arteries.  No significant stenosis of the vertebral arteries.  Mild plaque-like narrowing proximal right internal carotid artery without measurable stenosis.  Left carotid bifurcation without significant narrowing or irregularity.  IMPRESSION: No evidence of significant stenosis involving either carotid bifurcation or either vertebral artery.   Original Report Authenticated By: Lacy Duverney, M.D.    Mr Laqueta Jean YQ Contrast  02/17/2012  *RADIOLOGY REPORT*  Clinical Data:   Hypotension.  Sudden onset of severe dizziness and nausea.  MRI BRAIN WITH AND WITHOUT CONTRAST MRA HEAD WITHOUT CONTRAST MRA NECK WITHOUT AND WITH CONTRAST  Technique: Multiplanar, multiecho pulse sequences of the brain and surrounding structures were obtained according to standard protocol with and without intravenous contrast.  Angiographic images of the Circle of Lalonnie Shaffer were obtained using MRA technique without intravenous contrast.  Angiographic images of the neck were obtained using MRA technique without and with intravenous contrast.  Contrast:10 ml MultiHance.  Comparison:02/16/2012 head CT.  No comparison brain MR.  MRI HEAD  Findings: Diffusion sequence is limited secondary to the dental artifact. Probable artifact posterior right opercular region (series 4 image 19).  No acute infarct detected.  Mild to moderate small vessel disease type changes.  No intracranial mass.  Incidentally noted is small developmental venous anomaly right cerebellum.  Atrophy without hydrocephalus.  Cervical spondylotic changes.  Mild transverse ligament hypertrophy.  Spinal stenosis of the cervical spine.  IMPRESSION: Artifact limits diffusion sequence.  No acute infarct noted.  Small vessel disease type changes.  Spinal stenosis upper cervical spine.  MRA HEAD  Findings:Right internal carotid artery slightly larger than left with mild ectasia pre cavernous and cavernous segment of the right internal carotid artery.  Hypoplastic A1 segment of the left anterior cerebral artery otherwise anterior circulation without medium or large size vessel significant stenosis or occlusion.  Mild middle cerebral artery branch vessel irregularity.  Vertebral arteries and proximal basilar artery better delineated on MR angiogram of the neck.  Poor delineation PICAs.  Nonvisualization AICAs.  Posterior cerebral artery distal branch vessel mild irregularity.  Superior cerebellar artery mild to moderate narrowing.  IMPRESSION: Intracranial  atherosclerotic type changes most notable involving branch vessels as detailed above.  MRA NECK  Findings:Normal configuration of the origin of the great vessels from the aortic arch.  No significant stenosis of the subclavian arteries.  No significant stenosis of the vertebral arteries.  Mild plaque-like narrowing proximal right internal carotid artery without measurable stenosis.  Left carotid bifurcation without significant narrowing or irregularity.  IMPRESSION: No evidence of significant stenosis involving either carotid bifurcation or either vertebral artery.   Original Report Authenticated By: Lacy Duverney, M.D.    Mr Mra Head/brain Wo Cm  02/17/2012  *RADIOLOGY REPORT*  Clinical Data:  Hypotension.  Sudden onset of severe dizziness and nausea.  MRI BRAIN WITH AND WITHOUT CONTRAST MRA HEAD WITHOUT CONTRAST MRA NECK WITHOUT AND WITH CONTRAST  Technique: Multiplanar, multiecho pulse sequences of the brain and surrounding structures were obtained according to standard protocol with and without intravenous contrast.  Angiographic images of the Circle of Donnika Kucher were obtained using MRA technique  without intravenous contrast.  Angiographic images of the neck were obtained using MRA technique without and with intravenous contrast.  Contrast:10 ml MultiHance.  Comparison:02/16/2012 head CT.  No comparison brain MR.  MRI HEAD  Findings: Diffusion sequence is limited secondary to the dental artifact. Probable artifact posterior right opercular region (series 4 image 19).  No acute infarct detected.  Mild to moderate small vessel disease type changes.  No intracranial mass.  Incidentally noted is small developmental venous anomaly right cerebellum.  Atrophy without hydrocephalus.  Cervical spondylotic changes.  Mild transverse ligament hypertrophy.  Spinal stenosis of the cervical spine.  IMPRESSION: Artifact limits diffusion sequence.  No acute infarct noted.  Small vessel disease type changes.  Spinal stenosis  upper cervical spine.  MRA HEAD  Findings:Right internal carotid artery slightly larger than left with mild ectasia pre cavernous and cavernous segment of the right internal carotid artery.  Hypoplastic A1 segment of the left anterior cerebral artery otherwise anterior circulation without medium or large size vessel significant stenosis or occlusion.  Mild middle cerebral artery branch vessel irregularity.  Vertebral arteries and proximal basilar artery better delineated on MR angiogram of the neck.  Poor delineation PICAs.  Nonvisualization AICAs.  Posterior cerebral artery distal branch vessel mild irregularity.  Superior cerebellar artery mild to moderate narrowing.  IMPRESSION: Intracranial atherosclerotic type changes most notable involving branch vessels as detailed above.  MRA NECK  Findings:Normal configuration of the origin of the great vessels from the aortic arch.  No significant stenosis of the subclavian arteries.  No significant stenosis of the vertebral arteries.  Mild plaque-like narrowing proximal right internal carotid artery without measurable stenosis.  Left carotid bifurcation without significant narrowing or irregularity.  IMPRESSION: No evidence of significant stenosis involving either carotid bifurcation or either vertebral artery.   Original Report Authenticated By: Lacy Duverney, M.D.     CT of the brain   IMPRESSION:  No acute intracranial findings. Chronic changes of atrophy and  small vessel disease.   CT angio  Not ordered  MRI of the brain   IMPRESSION:  Artifact limits diffusion sequence. No acute infarct noted.  Small vessel disease type changes.  Spinal stenosis upper cervical spine.   MRA of the brain   IMPRESSION:  Intracranial atherosclerotic type changes most notable involving  branch vessels as detailed above.  IMPRESSION:  No evidence of significant stenosis involving either carotid  bifurcation or either vertebral artery.     2D Echocardiogram   pending  Carotid Doppler  pending  CXR  Not ordered  EKG   Normal sinus rhythm Normal ECG  Physical Exam   The patient is alert and cooperative.  Neurologic exam reveals full extraocular movements, speech is normal. Visual fields are full.  Motor testing reveals good strength of all four extremities.  The patient has good finger-nose-finger and heel-to-shin bilaterally. Gait was not tested.  Deep tendon reflexes are symmetric and normal. Toes are down going bilaterally.    ASSESSMENT Breanna [name] is a 78 y.o. female with an episode of vertigo, questionable TIA or vestibular problem. On aspirin 81 mg orally every day for secondary stroke prevention.  Stroke risk factors:  hypertension  Hospital day # 1  The patient has had an event of vertigo unassociated with headache. The patient is a pressure sensation, the neck and shoulder. The vertigo is associated with nausea, no vomiting. The patient had no focal symptoms of numbness or weakness. The patient has had episodes of mild dizziness, and  muffled hearing over the last 2 weeks. The patient has a history of vertigo 10 or 15 years ago. The patient is close to her baseline at this point. There is some question whether she had some slurred speech during the event. MRI the brain does show an acute stroke. MRA shows intracranial atherosclerotic changes, no large vessel blockages. MRA of the neck was unremarkable.   TREATMENT/PLAN  -2 D echo - Increase aspirin to 325 a day -Mobilize the patient  Not clear that the event represented a TIA.   Lesly Dukes

## 2012-02-17 NOTE — H&P (Signed)
[facility] History and Physical  Breanna [name] ZOX:[id number] DOB: 22-Nov-1930 DOA: 02/16/2012  Referring physician: EDP PCP: Darnelle Bos, MD  Specialists: Dr. Amada Jupiter  [facility]  Chief Complaint: Dizziness  HPI: Breanna [name] is a 78 y.o. female who presents to the ED with complaints of sudden onset of severe dizziness, and nausea at 6:30pm this evening.  She denies headache, fevers or chills.  The dizziness did not resolve until she was given medication in the ED.  When she arrived to the ED a Code Stroke was called and she was seen by neurology: Dr. Amada Jupiter, and had a CT scan of the Head which returned negative for acute findings.  She was referred for medical admission.     Review of Systems: The patient denies anorexia, fever, weight loss, vision loss, decreased hearing, hoarseness, chest pain, syncope, dyspnea on exertion, peripheral edema, balance deficits, hemoptysis, abdominal pain, melena, hematochezia, severe indigestion/heartburn, hematuria, incontinence, genital sores, muscle weakness, suspicious skin lesions, transient blindness, difficulty walking, depression, unusual weight change, abnormal bleeding, enlarged lymph nodes, angioedema, and breast masses.    Past Medical History  Diagnosis Date  . Hypertension   . Osteoporosis, senile     post menopausal  . Hypothyroidism    Past Surgical History  Procedure Date  . Back surgery 2006  . Tonsillectomy     Medications:  HOME MEDS: Prior to Admission medications   Medication Sig Start Date End Date Taking? Authorizing Provider  felodipine (PLENDIL) 10 MG 24 hr tablet Take 10 mg by mouth daily.   Yes Historical Provider, MD  levothyroxine (SYNTHROID, LEVOTHROID) 88 MCG tablet Take 88 mcg by mouth daily.   Yes Historical Provider, MD  lisinopril (PRINIVIL,ZESTRIL) 10 MG tablet Take 10 mg by mouth daily.   Yes Historical Provider, MD  Multiple Vitamin (MULTIVITAMIN WITH  MINERALS) TABS Take 1 tablet by mouth daily.   Yes Historical Provider, MD    Allergies:  Allergies  Allergen Reactions  . Sulfa Antibiotics Nausea And Vomiting    Social History:   reports that she has never smoked. She does not have any smokeless tobacco history on file. She reports that she drinks alcohol. Her drug history not on file.   Family History  Problem Relation Age of Onset  . Stroke Father   . Cancer - Colon Mother      Physical Exam:  GEN:  Pleasant examined  and in no acute distress; cooperative with exam Filed Vitals:   02/16/12 2130 02/16/12 2145 02/16/12 2152 02/16/12 2250  BP: 142/53 136/56  128/61  Pulse: 99 91  92  Temp:   97.3 F (36.3 C) 98.5 F (36.9 C)  TempSrc:    Oral  Resp: 20 13  16   SpO2: 100% 100%  100%   Blood pressure 128/61, pulse 92, temperature 98.5 F (36.9 C), temperature source Oral, resp. rate 16, SpO2 100.00%. PSYCH: She is alert and oriented x4; does not appear anxious does not appear depressed; affect is normal HEENT: Normocephalic and Atraumatic, Mucous membranes pink; PERRLA; EOM intact; Fundi:  Benign;  No scleral icterus, Nares: Patent, Oropharynx: Clear, Fair Dentition, Neck:  FROM, no cervical lymphadenopathy nor thyromegaly or carotid bruit; no JVD; Breasts:: Not examined CHEST WALL: No tenderness CHEST: Normal respiration, clear to auscultation bilaterally HEART: Regular rate and rhythm; no murmurs rubs or gallops BACK: No kyphosis or scoliosis; no CVA tenderness ABDOMEN: Positive Bowel Sounds, soft non-tender; no masses, no organomegaly.  Rectal Exam: Not done EXTREMITIES: No bone  or joint deformity; age-appropriate arthropathy of the hands and knees; no cyanosis, clubbing or edema; no ulcerations. Genitalia: not examined PULSES: 2+ and symmetric SKIN: Normal hydration no rash or ulceration CNS: Cranial nerves 2-12 grossly intact ,  Able to move all 4 extremities, No pronator drift, No facial asymmetry, cerebellar  function intact, no focal neurologic deficit, Gait not assessed    Labs on Admission:  Basic Metabolic Panel:  Lab 02/16/12 1191 02/16/12 2104  NA 134* 133*  K 4.1 4.1  CL 104 98  CO2 -- 23  GLUCOSE 117* 120*  BUN 21 21  CREATININE 0.90 0.72  CALCIUM -- 9.2  MG -- --  PHOS -- --   Liver Function Tests:  Lab 02/16/12 2104  AST 30  ALT 18  ALKPHOS 66  BILITOT 0.3  PROT 6.9  ALBUMIN 4.0   No results found for this basename: LIPASE:5,AMYLASE:5 in the last 168 hours No results found for this basename: AMMONIA:5 in the last 168 hours CBC:  Lab 02/16/12 2120 02/16/12 2104  WBC -- 9.6  NEUTROABS -- 7.5  HGB 11.6* 11.7*  HCT 34.0* 33.3*  MCV -- 94.9  PLT -- 279   Cardiac Enzymes: No results found for this basename: CKTOTAL:5,CKMB:5,CKMBINDEX:5,TROPONINI:5 in the last 168 hours  BNP (last 3 results) No results found for this basename: PROBNP:3 in the last 8760 hours CBG: No results found for this basename: GLUCAP:5 in the last 168 hours  Radiological Exams on Admission: Ct Head Wo Contrast  02/16/2012  *RADIOLOGY REPORT*  Clinical Data: Acute onset vertigo.  CT HEAD WITHOUT CONTRAST  Technique:  Contiguous axial images were obtained from the base of the skull through the vertex without contrast.  Comparison: None.  Findings: There is no evidence for acute infarction, intracranial hemorrhage, mass lesion, hydrocephalus, or extra-axial fluid.  Mild atrophy.  Mild chronic microvascular ischemic change.  Intact calvarium.  Clear sinuses and mastoids.  IMPRESSION: No acute intracranial findings.  Chronic changes of atrophy and small vessel disease.  Critical Value/emergent results were called by telephone at the time of interpretation on 02/16/2012 at 920 PM to EDP Rancour, who verbally acknowledged these results.   Original Report Authenticated By: Davonna Belling, M.D.       Assessment: Principal Problem:  *TIA (transient ischemic attack) Active Problems:  Vertigo   Hypertension  Hypothyroid  Nausea    Plan:      Admit to Telemetry Bed Observation Status TIA Workup, Neuro Checks MRI/MRA in AM, Carotid US, 2 D ECHO Reconcile Home Medications DVT Prophylaxis   Code Status: FULL CODE Family Communication: Daughters at Bedside Disposition Plan: Return to Home  Time spent: 30 Minutes  Ron Parker [facility] Pager [phone]  If 7PM-7AM, please contact night-coverage [email or web address] Password [email or web address] 02/17/2012, 12:57 AM

## 2012-02-17 NOTE — ED Provider Notes (Signed)
I saw and evaluated the patient, reviewed the resident's note and I agree with the findings and plan.  Acute onset of vertigo and nausea at 1830.  Somewhat improved with zofran.  CN 2-12 intact, 5/5 strength throughout, some past pointing on finger to nose. No nystagmus. Test of skew and head impulse testing negative.   Glynn Octave, MD 02/17/12 [phone]

## 2012-02-18 DIAGNOSIS — I679 Cerebrovascular disease, unspecified: Secondary | ICD-10-CM | POA: Diagnosis present

## 2012-02-18 DIAGNOSIS — G459 Transient cerebral ischemic attack, unspecified: Secondary | ICD-10-CM | POA: Diagnosis not present

## 2012-02-18 DIAGNOSIS — R42 Dizziness and giddiness: Secondary | ICD-10-CM | POA: Diagnosis not present

## 2012-02-18 DIAGNOSIS — E039 Hypothyroidism, unspecified: Secondary | ICD-10-CM | POA: Diagnosis not present

## 2012-02-18 DIAGNOSIS — I10 Essential (primary) hypertension: Secondary | ICD-10-CM | POA: Diagnosis not present

## 2012-02-18 DIAGNOSIS — M4802 Spinal stenosis, cervical region: Secondary | ICD-10-CM | POA: Diagnosis present

## 2012-02-18 MED ORDER — ASPIRIN 325 MG PO TABS: 325.0000 mg | ORAL_TABLET | Freq: Every day | ORAL | Status: DC

## 2012-02-18 NOTE — Progress Notes (Signed)
  Echocardiogram 2D Echocardiogram has been performed.  [place]ian Co 02/18/2012, 10:10 AM

## 2012-02-18 NOTE — Progress Notes (Signed)
Assessment/Plan: Principal Problem:  *Vertigo - she had several hours of vertigo with severe nausea (mild vomiting) and no other neuro symptoms with no evidence of stroke on MRI. I lean toward a peripheral cause of this rather than central. MRI/MRA did show some cervical spinal stenosis and some mild vascular disease, but I don't think these are the etiology. Plan to get echo today and d/c later today. I spoke with her as she was standing at sink and she has no instability with standing. She and I agree that she is unlikley to benefit from OP PT (and her husband is getting balance training now!), unless she is more unsteady at home than she thinks she will be.  Active Problems:  TIA (transient ischemic attack) - doubt this as etiology. See comments above  Hypertension  Hypothyroid  Nausea - resolved  H/O osteoporosis  Cervical spinal stenosis - seen on MRA/MRI  Cerebrovascular disease - seen on MRA   Subjective: She feels diffusely weak but otherwise no complaints. No more vertigo. She never had any other neuro symptoms except vertigo  Objective:  Vital Signs: Filed Vitals:   02/17/12 2100 02/18/12 0000 02/18/12 0500 02/18/12 0644  BP: 123/69 103/63 94/61 111/69  Pulse: 83 76 70 99  Temp: 98.3 F (36.8 C) 97.9 F (36.6 C) 98.4 F (36.9 C)   TempSrc:      Resp: 18 19 16 17   Height:      Weight:      SpO2: 95% 98% 97%      EXAM: no nystagmus  No intake or output data in the 24 hours ending 02/18/12 0727  Lab Results:  [facility] 02/16/12 2120 02/16/12 2104  NA 134* 133*  K 4.1 4.1  CL 104 98  CO2 -- 23  GLUCOSE 117* 120*  BUN 21 21  CREATININE 0.90 0.72  CALCIUM -- 9.2  MG -- --  PHOS -- --    Basename 02/16/12 2104  AST 30  ALT 18  ALKPHOS 66  BILITOT 0.3  PROT 6.9  ALBUMIN 4.0   No results found for this basename: LIPASE:2,AMYLASE:2 in the last 72 hours  Basename 02/16/12 2120 02/16/12 2104  WBC -- 9.6  NEUTROABS -- 7.5  HGB 11.6* 11.7*  HCT 34.0*  33.3*  MCV -- 94.9  PLT -- 279   No results found for this basename: CKTOTAL:3,CKMB:3,CKMBINDEX:3,TROPONINI:3 in the last 72 hours No components found with this basename: POCBNP:3 No results found for this basename: DDIMER:2 in the last 72 hours  Basename 02/17/12 0515  HGBA1C 5.6    Basename 02/17/12 0515  CHOL 188  HDL 81  LDLCALC 99  TRIG 40  CHOLHDL 2.3  LDLDIRECT --   No results found for this basename: TSH,T4TOTAL,FREET3,T3FREE,THYROIDAB in the last 72 hours No results found for this basename: VITAMINB12:2,FOLATE:2,FERRITIN:2,TIBC:2,IRON:2,RETICCTPCT:2 in the last 72 hours  Studies/Results: Ct Head Wo Contrast  02/16/2012  *RADIOLOGY REPORT*  Clinical Data: Acute onset vertigo.  CT HEAD WITHOUT CONTRAST  Technique:  Contiguous axial images were obtained from the base of the skull through the vertex without contrast.  Comparison: None.  Findings: There is no evidence for acute infarction, intracranial hemorrhage, mass lesion, hydrocephalus, or extra-axial fluid.  Mild atrophy.  Mild chronic microvascular ischemic change.  Intact calvarium.  Clear sinuses and mastoids.  IMPRESSION: No acute intracranial findings.  Chronic changes of atrophy and small vessel disease.  Critical Value/emergent results were called by telephone at the time of interpretation on 02/16/2012 at 920 PM to EDP  Rancour, who verbally acknowledged these results.   Original Report Authenticated By: Davonna Belling, M.D.    Mr Angiogram Neck W Wo Contrast  02/17/2012  *RADIOLOGY REPORT*  Clinical Data:  Hypotension.  Sudden onset of severe dizziness and nausea.  MRI BRAIN WITH AND WITHOUT CONTRAST MRA HEAD WITHOUT CONTRAST MRA NECK WITHOUT AND WITH CONTRAST  Technique: Multiplanar, multiecho pulse sequences of the brain and surrounding structures were obtained according to standard protocol with and without intravenous contrast.  Angiographic images of the Circle of Willis were obtained using MRA technique without  intravenous contrast.  Angiographic images of the neck were obtained using MRA technique without and with intravenous contrast.  Contrast:10 ml MultiHance.  Comparison:02/16/2012 head CT.  No comparison brain MR.  MRI HEAD  Findings: Diffusion sequence is limited secondary to the dental artifact. Probable artifact posterior right opercular region (series 4 image 19).  No acute infarct detected.  Mild to moderate small vessel disease type changes.  No intracranial mass.  Incidentally noted is small developmental venous anomaly right cerebellum.  Atrophy without hydrocephalus.  Cervical spondylotic changes.  Mild transverse ligament hypertrophy.  Spinal stenosis of the cervical spine.  IMPRESSION: Artifact limits diffusion sequence.  No acute infarct noted.  Small vessel disease type changes.  Spinal stenosis upper cervical spine.  MRA HEAD  Findings:Right internal carotid artery slightly larger than left with mild ectasia pre cavernous and cavernous segment of the right internal carotid artery.  Hypoplastic A1 segment of the left anterior cerebral artery otherwise anterior circulation without medium or large size vessel significant stenosis or occlusion.  Mild middle cerebral artery branch vessel irregularity.  Vertebral arteries and proximal basilar artery better delineated on MR angiogram of the neck.  Poor delineation PICAs.  Nonvisualization AICAs.  Posterior cerebral artery distal branch vessel mild irregularity.  Superior cerebellar artery mild to moderate narrowing.  IMPRESSION: Intracranial atherosclerotic type changes most notable involving branch vessels as detailed above.  MRA NECK  Findings:Normal configuration of the origin of the great vessels from the aortic arch.  No significant stenosis of the subclavian arteries.  No significant stenosis of the vertebral arteries.  Mild plaque-like narrowing proximal right internal carotid artery without measurable stenosis.  Left carotid bifurcation without  significant narrowing or irregularity.  IMPRESSION: No evidence of significant stenosis involving either carotid bifurcation or either vertebral artery.   Original Report Authenticated By: Lacy Duverney, M.D.    Mr Laqueta Jean NW Contrast  02/17/2012  *RADIOLOGY REPORT*  Clinical Data:  Hypotension.  Sudden onset of severe dizziness and nausea.  MRI BRAIN WITH AND WITHOUT CONTRAST MRA HEAD WITHOUT CONTRAST MRA NECK WITHOUT AND WITH CONTRAST  Technique: Multiplanar, multiecho pulse sequences of the brain and surrounding structures were obtained according to standard protocol with and without intravenous contrast.  Angiographic images of the Circle of Willis were obtained using MRA technique without intravenous contrast.  Angiographic images of the neck were obtained using MRA technique without and with intravenous contrast.  Contrast:10 ml MultiHance.  Comparison:02/16/2012 head CT.  No comparison brain MR.  MRI HEAD  Findings: Diffusion sequence is limited secondary to the dental artifact. Probable artifact posterior right opercular region (series 4 image 19).  No acute infarct detected.  Mild to moderate small vessel disease type changes.  No intracranial mass.  Incidentally noted is small developmental venous anomaly right cerebellum.  Atrophy without hydrocephalus.  Cervical spondylotic changes.  Mild transverse ligament hypertrophy.  Spinal stenosis of the cervical spine.  IMPRESSION: Artifact limits diffusion  sequence.  No acute infarct noted.  Small vessel disease type changes.  Spinal stenosis upper cervical spine.  MRA HEAD  Findings:Right internal carotid artery slightly larger than left with mild ectasia pre cavernous and cavernous segment of the right internal carotid artery.  Hypoplastic A1 segment of the left anterior cerebral artery otherwise anterior circulation without medium or large size vessel significant stenosis or occlusion.  Mild middle cerebral artery branch vessel irregularity.  Vertebral  arteries and proximal basilar artery better delineated on MR angiogram of the neck.  Poor delineation PICAs.  Nonvisualization AICAs.  Posterior cerebral artery distal branch vessel mild irregularity.  Superior cerebellar artery mild to moderate narrowing.  IMPRESSION: Intracranial atherosclerotic type changes most notable involving branch vessels as detailed above.  MRA NECK  Findings:Normal configuration of the origin of the great vessels from the aortic arch.  No significant stenosis of the subclavian arteries.  No significant stenosis of the vertebral arteries.  Mild plaque-like narrowing proximal right internal carotid artery without measurable stenosis.  Left carotid bifurcation without significant narrowing or irregularity.  IMPRESSION: No evidence of significant stenosis involving either carotid bifurcation or either vertebral artery.   Original Report Authenticated By: Lacy Duverney, M.D.    Mr Mra Head/brain Wo Cm  02/17/2012  *RADIOLOGY REPORT*  Clinical Data:  Hypotension.  Sudden onset of severe dizziness and nausea.  MRI BRAIN WITH AND WITHOUT CONTRAST MRA HEAD WITHOUT CONTRAST MRA NECK WITHOUT AND WITH CONTRAST  Technique: Multiplanar, multiecho pulse sequences of the brain and surrounding structures were obtained according to standard protocol with and without intravenous contrast.  Angiographic images of the Circle of Willis were obtained using MRA technique without intravenous contrast.  Angiographic images of the neck were obtained using MRA technique without and with intravenous contrast.  Contrast:10 ml MultiHance.  Comparison:02/16/2012 head CT.  No comparison brain MR.  MRI HEAD  Findings: Diffusion sequence is limited secondary to the dental artifact. Probable artifact posterior right opercular region (series 4 image 19).  No acute infarct detected.  Mild to moderate small vessel disease type changes.  No intracranial mass.  Incidentally noted is small developmental venous anomaly right  cerebellum.  Atrophy without hydrocephalus.  Cervical spondylotic changes.  Mild transverse ligament hypertrophy.  Spinal stenosis of the cervical spine.  IMPRESSION: Artifact limits diffusion sequence.  No acute infarct noted.  Small vessel disease type changes.  Spinal stenosis upper cervical spine.  MRA HEAD  Findings:Right internal carotid artery slightly larger than left with mild ectasia pre cavernous and cavernous segment of the right internal carotid artery.  Hypoplastic A1 segment of the left anterior cerebral artery otherwise anterior circulation without medium or large size vessel significant stenosis or occlusion.  Mild middle cerebral artery branch vessel irregularity.  Vertebral arteries and proximal basilar artery better delineated on MR angiogram of the neck.  Poor delineation PICAs.  Nonvisualization AICAs.  Posterior cerebral artery distal branch vessel mild irregularity.  Superior cerebellar artery mild to moderate narrowing.  IMPRESSION: Intracranial atherosclerotic type changes most notable involving branch vessels as detailed above.  MRA NECK  Findings:Normal configuration of the origin of the great vessels from the aortic arch.  No significant stenosis of the subclavian arteries.  No significant stenosis of the vertebral arteries.  Mild plaque-like narrowing proximal right internal carotid artery without measurable stenosis.  Left carotid bifurcation without significant narrowing or irregularity.  IMPRESSION: No evidence of significant stenosis involving either carotid bifurcation or either vertebral artery.   Original Report Authenticated By: Viviann Spare  Constance Goltz, M.D.    Medications: Medications administered in the last 24 hours reviewed.  Current Medication List reviewed.    LOS: 2 days   [facility] Internal Medicine @ P[facility]i Sears [phone]) 02/18/2012, 7:27 AM

## 2012-02-18 NOTE — Progress Notes (Addendum)
D/c order for pt. Neuro signs rechecked, WNL, no weakness, no facial drooping, pt stuck out tongue, central position. Gave stroke handout to patient and went over TIA, symptoms to look for, and the treatment. Instructed to start taking aspirin and continue with previous medications. Pt to f/u with Dr Earl Gala in 1 week. Removed PIV, WNL. D/c'd telemetry, pt in SR. D/c'd to home with daughters.

## 2012-02-22 DIAGNOSIS — Z01419 Encounter for gynecological examination (general) (routine) without abnormal findings: Secondary | ICD-10-CM | POA: Diagnosis not present

## 2012-02-22 DIAGNOSIS — Z1151 Encounter for screening for human papillomavirus (HPV): Secondary | ICD-10-CM | POA: Diagnosis not present

## 2012-02-22 DIAGNOSIS — Z9189 Other specified personal risk factors, not elsewhere classified: Secondary | ICD-10-CM | POA: Diagnosis not present

## 2012-02-22 DIAGNOSIS — B977 Papillomavirus as the cause of diseases classified elsewhere: Secondary | ICD-10-CM | POA: Diagnosis not present

## 2012-02-25 NOTE — Discharge Summary (Signed)
Physician Discharge Summary  NAME:Breanna [name]  ZOX:[id number]  DOB: 14-Sep-1930   Admit date: 02/16/2012 Discharge date: 02/25/2012  Admitting Diagnosis: Vertigo  Discharge Diagnoses:  Principal Problem:  *Vertigo Active Problems:  Hypertension  Hypothyroid  H/O osteoporosis  Cervical spinal stenosis  Cerebrovascular disease   Discharge Condition: improved  Hospital Course: Patient was admitted and underwent testing for possible TIA. She had an MRI/MRA that showed some cervical spinal stenosis and an echo that showed normal LV function, no valvular disease, and no WMA. A carotid doppler was negative.   Initially, there was some concern of TIA or stroke. However, neurology ultimately came to decide that this was not likely to be a cerebrovascular issue but was more likely a peripheral vertigo.   The vertigo improved and she had only some mild residual imbalance. Decision on outpatient PT will be made based on how she does over the next 2-3 days after discharge.   Consults: Neurology  Disposition: 01-Home or Self Care  Discharge Orders    Future Orders Please Complete By Expires   Diet - low sodium heart healthy      Increase activity slowly      Discharge instructions      Comments:   Do not drive for 3-4 days and be sure you are not having more symptoms of vertigo.       Medication List     As of 02/25/2012  6:08 AM    TAKE these medications         aspirin 325 MG tablet   Take 1 tablet (325 mg total) by mouth daily.      felodipine 10 MG 24 hr tablet   Commonly known as: PLENDIL   Take 10 mg by mouth daily.      levothyroxine 88 MCG tablet   Commonly known as: SYNTHROID, LEVOTHROID   Take 88 mcg by mouth daily.      lisinopril 10 MG tablet   Commonly known as: PRINIVIL,ZESTRIL   Take 10 mg by mouth daily.      multivitamin with minerals Tabs   Take 1 tablet by mouth daily.           Follow-up Information    Follow up with Darnelle Bos, MD. (We will call you to arrange followup visit)    Contact information:   [address], [address] [facility], [place] [id number] [phone]          Things to follow up in the outpatient setting: call in 1-2 days and assess imbalance  Time coordinating discharge: 30 minutes including medication reconciliation,  preparation of discharge papers, and discussion with patient.    SignedDarnelle Bos 02/25/2012, 6:08 AM

## 2012-03-12 ENCOUNTER — Ambulatory Visit: Payer: Medicare Other | Attending: Internal Medicine | Admitting: *Deleted

## 2012-03-12 DIAGNOSIS — R42 Dizziness and giddiness: Secondary | ICD-10-CM | POA: Insufficient documentation

## 2012-03-12 DIAGNOSIS — IMO0001 Reserved for inherently not codable concepts without codable children: Secondary | ICD-10-CM | POA: Insufficient documentation

## 2012-03-17 ENCOUNTER — Ambulatory Visit: Payer: Medicare Other | Admitting: Physical Therapy

## 2012-03-17 DIAGNOSIS — R42 Dizziness and giddiness: Secondary | ICD-10-CM | POA: Diagnosis not present

## 2012-03-17 DIAGNOSIS — IMO0001 Reserved for inherently not codable concepts without codable children: Secondary | ICD-10-CM | POA: Diagnosis not present

## 2012-03-19 ENCOUNTER — Ambulatory Visit: Payer: Medicare Other | Admitting: Physical Therapy

## 2012-03-19 DIAGNOSIS — IMO0001 Reserved for inherently not codable concepts without codable children: Secondary | ICD-10-CM | POA: Diagnosis not present

## 2012-03-19 DIAGNOSIS — R42 Dizziness and giddiness: Secondary | ICD-10-CM | POA: Diagnosis not present

## 2012-03-21 DIAGNOSIS — R351 Nocturia: Secondary | ICD-10-CM | POA: Diagnosis not present

## 2012-03-21 DIAGNOSIS — R339 Retention of urine, unspecified: Secondary | ICD-10-CM | POA: Diagnosis not present

## 2012-03-21 DIAGNOSIS — N8111 Cystocele, midline: Secondary | ICD-10-CM | POA: Diagnosis not present

## 2012-03-28 ENCOUNTER — Ambulatory Visit: Payer: Medicare Other | Admitting: Physical Therapy

## 2012-03-31 ENCOUNTER — Ambulatory Visit: Payer: Medicare Other | Admitting: Physical Therapy

## 2012-04-03 ENCOUNTER — Ambulatory Visit: Payer: Medicare Other | Admitting: Physical Therapy

## 2012-04-04 DIAGNOSIS — IMO0002 Reserved for concepts with insufficient information to code with codable children: Secondary | ICD-10-CM | POA: Diagnosis not present

## 2012-04-04 DIAGNOSIS — M25569 Pain in unspecified knee: Secondary | ICD-10-CM | POA: Diagnosis not present

## 2012-04-04 DIAGNOSIS — M5137 Other intervertebral disc degeneration, lumbosacral region: Secondary | ICD-10-CM | POA: Diagnosis not present

## 2012-04-07 ENCOUNTER — Ambulatory Visit: Payer: Medicare Other | Attending: Internal Medicine | Admitting: Physical Therapy

## 2012-04-07 DIAGNOSIS — R42 Dizziness and giddiness: Secondary | ICD-10-CM | POA: Diagnosis not present

## 2012-04-07 DIAGNOSIS — IMO0001 Reserved for inherently not codable concepts without codable children: Secondary | ICD-10-CM | POA: Diagnosis not present

## 2012-04-09 ENCOUNTER — Ambulatory Visit: Payer: Medicare Other | Admitting: Rehabilitative and Restorative Service Providers"

## 2012-04-09 DIAGNOSIS — Z1231 Encounter for screening mammogram for malignant neoplasm of breast: Secondary | ICD-10-CM | POA: Diagnosis not present

## 2012-04-14 ENCOUNTER — Ambulatory Visit: Payer: Medicare Other | Admitting: Physical Therapy

## 2012-04-14 DIAGNOSIS — R920 Mammographic microcalcification found on diagnostic imaging of breast: Secondary | ICD-10-CM | POA: Diagnosis not present

## 2012-04-16 ENCOUNTER — Ambulatory Visit: Payer: Medicare Other | Admitting: Rehabilitative and Restorative Service Providers"

## 2012-04-17 ENCOUNTER — Other Ambulatory Visit: Payer: Self-pay | Admitting: Radiology

## 2012-04-17 DIAGNOSIS — D249 Benign neoplasm of unspecified breast: Secondary | ICD-10-CM | POA: Diagnosis not present

## 2012-04-17 DIAGNOSIS — R928 Other abnormal and inconclusive findings on diagnostic imaging of breast: Secondary | ICD-10-CM | POA: Diagnosis not present

## 2012-04-21 ENCOUNTER — Ambulatory Visit: Payer: Medicare Other | Admitting: Physical Therapy

## 2012-04-22 DIAGNOSIS — R351 Nocturia: Secondary | ICD-10-CM | POA: Diagnosis not present

## 2012-04-22 DIAGNOSIS — R339 Retention of urine, unspecified: Secondary | ICD-10-CM | POA: Diagnosis not present

## 2012-04-22 DIAGNOSIS — N8111 Cystocele, midline: Secondary | ICD-10-CM | POA: Diagnosis not present

## 2012-04-25 DIAGNOSIS — M5137 Other intervertebral disc degeneration, lumbosacral region: Secondary | ICD-10-CM | POA: Diagnosis not present

## 2012-04-30 DIAGNOSIS — R351 Nocturia: Secondary | ICD-10-CM | POA: Diagnosis not present

## 2012-04-30 DIAGNOSIS — N8111 Cystocele, midline: Secondary | ICD-10-CM | POA: Diagnosis not present

## 2012-05-07 DIAGNOSIS — N8111 Cystocele, midline: Secondary | ICD-10-CM | POA: Diagnosis not present

## 2012-05-07 DIAGNOSIS — N8110 Cystocele, unspecified: Secondary | ICD-10-CM | POA: Diagnosis not present

## 2012-06-04 ENCOUNTER — Other Ambulatory Visit: Payer: Self-pay | Admitting: Urology

## 2012-06-16 DIAGNOSIS — I10 Essential (primary) hypertension: Secondary | ICD-10-CM | POA: Diagnosis not present

## 2012-06-16 DIAGNOSIS — M545 Low back pain, unspecified: Secondary | ICD-10-CM | POA: Diagnosis not present

## 2012-06-16 DIAGNOSIS — R42 Dizziness and giddiness: Secondary | ICD-10-CM | POA: Diagnosis not present

## 2012-06-16 DIAGNOSIS — E039 Hypothyroidism, unspecified: Secondary | ICD-10-CM | POA: Diagnosis not present

## 2012-07-24 ENCOUNTER — Encounter (HOSPITAL_COMMUNITY): Payer: Self-pay | Admitting: Pharmacist

## 2012-08-04 ENCOUNTER — Encounter (HOSPITAL_COMMUNITY)
Admission: RE | Admit: 2012-08-04 | Discharge: 2012-08-04 | Disposition: A | Discharge status: Home / Self Care | Payer: Medicare Other | Source: Ambulatory Visit | Attending: Obstetrics and Gynecology | Admitting: Obstetrics and Gynecology

## 2012-08-04 ENCOUNTER — Encounter (HOSPITAL_COMMUNITY): Payer: Self-pay

## 2012-08-04 DIAGNOSIS — Z01818 Encounter for other preprocedural examination: Secondary | ICD-10-CM | POA: Insufficient documentation

## 2012-08-04 DIAGNOSIS — Z01812 Encounter for preprocedural laboratory examination: Secondary | ICD-10-CM | POA: Insufficient documentation

## 2012-08-04 HISTORY — DX: Dizziness and giddiness: R42

## 2012-08-04 LAB — BASIC METABOLIC PANEL
GFR calc Af Amer: >90 mL/min (ref >90)
GFR calc non Af Amer: 78 mL/min — ABNORMAL LOW (ref >90)
Glucose, Bld: 90 mg/dL (ref 70–99)
Potassium: 4.4 mEq/L (ref 3.5–5.1)
Sodium: 130 mEq/L — ABNORMAL LOW (ref 135–145)

## 2012-08-04 LAB — CBC
HCT: 35 % — ABNORMAL LOW (ref 36.0–46.0)
Hemoglobin: 12.2 g/dL (ref 12.0–15.0)
MCHC: 34.9 g/dL (ref 30.0–36.0)

## 2012-08-04 LAB — PROTIME-INR: INR: 0.98 (ref 0.00–1.49)

## 2012-08-04 LAB — APTT: aPTT: 36 seconds (ref 24–37)

## 2012-08-04 LAB — SURGICAL PCR SCREEN: Staphylococcus aureus: INVALID — AB

## 2012-08-04 NOTE — Patient Instructions (Addendum)
[address] RAYNEE [address]  08/04/2012   Your procedure is scheduled on:  08/19/12  Enter through the Main Entrance of [facility] at 6 AM.  Pick up the phone at the desk and dial [phone].   Call this number if you have problems the morning of surgery: [phone]   Remember:   Do not eat food:After Midnight.  Do not drink clear liquids: After Midnight.  Take these medicines the morning of surgery with A SIP OF WATER: Thyroid, Blood pressure, do not take vitamins,   Do not wear jewelry, make-up or nail polish.  Do not wear lotions, powders, or perfumes. You may wear deodorant.  Do not shave 48 hours prior to surgery.  Do not bring valuables to the hospital.  Contacts, dentures or bridgework may not be worn into surgery.  Leave suitcase in the car. After surgery it may be brought to your room.  For patients admitted to the hospital, checkout time is 11:00 AM the day of discharge.   Patients discharged the day of surgery will not be allowed to drive home.  Name and phone number of your driver: NA  Special Instructions: Shower using CHG 2 nights before surgery and the night before surgery.  If you shower the day of surgery use CHG.  Use special wash - you have one bottle of CHG for all showers.  You should use approximately 1/3 of the bottle for each shower.   Please read over the following fact sheets that you were given: MRSA Information

## 2012-08-08 LAB — MRSA CULTURE

## 2012-08-12 DIAGNOSIS — L570 Actinic keratosis: Secondary | ICD-10-CM | POA: Diagnosis not present

## 2012-08-12 DIAGNOSIS — L821 Other seborrheic keratosis: Secondary | ICD-10-CM | POA: Diagnosis not present

## 2012-08-12 DIAGNOSIS — L659 Nonscarring hair loss, unspecified: Secondary | ICD-10-CM | POA: Diagnosis not present

## 2012-08-14 ENCOUNTER — Other Ambulatory Visit: Payer: Self-pay | Admitting: Obstetrics and Gynecology

## 2012-08-18 MED ORDER — CIPROFLOXACIN IN D5W 400 MG/200ML IV SOLN
400.0000 mg | INTRAVENOUS | Status: AC
  Administered 2012-08-19: 400 mg via INTRAVENOUS
  Filled 2012-08-18: qty 200

## 2012-08-18 MED ORDER — CEFAZOLIN SODIUM-DEXTROSE 2-3 GM-% IV SOLR
2.0000 g | INTRAVENOUS | Status: AC
  Administered 2012-08-19: 2 g via INTRAVENOUS

## 2012-08-19 ENCOUNTER — Encounter (HOSPITAL_COMMUNITY): Payer: Self-pay | Admitting: Anesthesiology

## 2012-08-19 ENCOUNTER — Encounter (HOSPITAL_COMMUNITY)
Admission: RE | Disposition: A | Discharge status: Home / Self Care | Payer: Self-pay | Source: Ambulatory Visit | Attending: Obstetrics and Gynecology

## 2012-08-19 ENCOUNTER — Inpatient Hospital Stay (HOSPITAL_COMMUNITY): Payer: Medicare Other | Admitting: Anesthesiology

## 2012-08-19 ENCOUNTER — Inpatient Hospital Stay (HOSPITAL_COMMUNITY)
Admission: RE | Admit: 2012-08-19 | Discharge: 2012-08-20 | DRG: 743 | Disposition: A | Discharge status: Home / Self Care | Payer: Medicare Other | Source: Ambulatory Visit | Attending: Obstetrics and Gynecology | Admitting: Obstetrics and Gynecology

## 2012-08-19 ENCOUNTER — Encounter (HOSPITAL_COMMUNITY): Payer: Self-pay | Admitting: *Deleted

## 2012-08-19 DIAGNOSIS — N9989 Other postprocedural complications and disorders of genitourinary system: Secondary | ICD-10-CM | POA: Diagnosis not present

## 2012-08-19 DIAGNOSIS — N819 Female genital prolapse, unspecified: Secondary | ICD-10-CM | POA: Diagnosis not present

## 2012-08-19 DIAGNOSIS — L988 Other specified disorders of the skin and subcutaneous tissue: Secondary | ICD-10-CM | POA: Diagnosis present

## 2012-08-19 DIAGNOSIS — D279 Benign neoplasm of unspecified ovary: Secondary | ICD-10-CM | POA: Diagnosis present

## 2012-08-19 DIAGNOSIS — J438 Other emphysema: Secondary | ICD-10-CM | POA: Diagnosis not present

## 2012-08-19 DIAGNOSIS — N880 Leukoplakia of cervix uteri: Secondary | ICD-10-CM | POA: Diagnosis not present

## 2012-08-19 DIAGNOSIS — IMO0002 Reserved for concepts with insufficient information to code with codable children: Secondary | ICD-10-CM | POA: Diagnosis not present

## 2012-08-19 DIAGNOSIS — N813 Complete uterovaginal prolapse: Secondary | ICD-10-CM | POA: Diagnosis not present

## 2012-08-19 DIAGNOSIS — R110 Nausea: Secondary | ICD-10-CM | POA: Diagnosis not present

## 2012-08-19 DIAGNOSIS — N812 Incomplete uterovaginal prolapse: Secondary | ICD-10-CM | POA: Diagnosis not present

## 2012-08-19 DIAGNOSIS — R339 Retention of urine, unspecified: Secondary | ICD-10-CM | POA: Diagnosis not present

## 2012-08-19 DIAGNOSIS — Z90710 Acquired absence of both cervix and uterus: Secondary | ICD-10-CM

## 2012-08-19 DIAGNOSIS — Y658 Other specified misadventures during surgical and medical care: Secondary | ICD-10-CM | POA: Diagnosis not present

## 2012-08-19 DIAGNOSIS — R109 Unspecified abdominal pain: Secondary | ICD-10-CM | POA: Diagnosis not present

## 2012-08-19 DIAGNOSIS — N8111 Cystocele, midline: Secondary | ICD-10-CM | POA: Diagnosis not present

## 2012-08-19 DIAGNOSIS — J984 Other disorders of lung: Secondary | ICD-10-CM | POA: Diagnosis not present

## 2012-08-19 HISTORY — PX: CYSTOCELE REPAIR: SHX163

## 2012-08-19 HISTORY — PX: VAGINAL HYSTERECTOMY: SHX2639

## 2012-08-19 HISTORY — PX: SALPINGOOPHORECTOMY: SHX82

## 2012-08-19 HISTORY — PX: VAGINAL PROLAPSE REPAIR: SHX830

## 2012-08-19 HISTORY — PX: CYSTOSCOPY: SHX5120

## 2012-08-19 LAB — CBC
Platelets: 300 10*3/uL (ref 150–400)
RDW: 14.6 % (ref 11.5–15.5)
WBC: 5.8 10*3/uL (ref 4.0–10.5)

## 2012-08-19 LAB — BASIC METABOLIC PANEL
Calcium: 9.5 mg/dL (ref 8.4–10.5)
Chloride: 99 mEq/L (ref 96–112)
Creatinine, Ser: 0.8 mg/dL (ref 0.50–1.10)
GFR calc Af Amer: 79 mL/min — ABNORMAL LOW (ref >90)
GFR calc non Af Amer: 68 mL/min — ABNORMAL LOW (ref >90)

## 2012-08-19 LAB — TYPE AND SCREEN
ABO/RH(D): O POS
Antibody Screen: NEGATIVE

## 2012-08-19 SURGERY — HYSTERECTOMY, VAGINAL
Anesthesia: General | Site: Vagina | Wound class: Clean Contaminated

## 2012-08-19 MED ORDER — ONDANSETRON HCL 4 MG PO TABS
4.0000 mg | ORAL_TABLET | Freq: Four times a day (QID) | ORAL | Status: DC | PRN
  Administered 2012-08-20: 4 mg via ORAL
  Filled 2012-08-19: qty 1

## 2012-08-19 MED ORDER — INDIGOTINDISULFONATE SODIUM 8 MG/ML IJ SOLN
INTRAMUSCULAR | Status: AC
  Filled 2012-08-19: qty 5

## 2012-08-19 MED ORDER — SODIUM CHLORIDE 0.9 % IR SOLN
Freq: Once | Status: AC
  Administered 2012-08-19: 08:00:00
  Filled 2012-08-19: qty 1

## 2012-08-19 MED ORDER — ESTRADIOL 0.1 MG/GM VA CREA
TOPICAL_CREAM | VAGINAL | Status: AC
  Filled 2012-08-19: qty 42.5

## 2012-08-19 MED ORDER — LIDOCAINE-EPINEPHRINE (PF) 1 %-1:200000 IJ SOLN
INTRAMUSCULAR | Status: AC
  Filled 2012-08-19: qty 10

## 2012-08-19 MED ORDER — OXYCODONE-ACETAMINOPHEN 5-325 MG PO TABS
1.0000 | ORAL_TABLET | ORAL | Status: DC | PRN
  Administered 2012-08-20: 1 via ORAL
  Filled 2012-08-19: qty 1

## 2012-08-19 MED ORDER — DEXAMETHASONE SODIUM PHOSPHATE 10 MG/ML IJ SOLN
INTRAMUSCULAR | Status: AC
  Filled 2012-08-19: qty 1

## 2012-08-19 MED ORDER — LIDOCAINE HCL (CARDIAC) 20 MG/ML IV SOLN
INTRAVENOUS | Status: AC
  Filled 2012-08-19: qty 5

## 2012-08-19 MED ORDER — EPHEDRINE SULFATE 50 MG/ML IJ SOLN
INTRAMUSCULAR | Status: DC | PRN
  Administered 2012-08-19: 10 mg via INTRAVENOUS

## 2012-08-19 MED ORDER — MEPERIDINE HCL 25 MG/ML IJ SOLN: 6.2500 mg | INTRAMUSCULAR | Status: DC | PRN

## 2012-08-19 MED ORDER — DEXTROSE IN LACTATED RINGERS 5 % IV SOLN
INTRAVENOUS | Status: DC
  Administered 2012-08-19: 13:00:00 via INTRAVENOUS

## 2012-08-19 MED ORDER — SODIUM CHLORIDE 0.9 % IR SOLN
Status: DC | PRN
  Administered 2012-08-19: 08:00:00

## 2012-08-19 MED ORDER — KETOROLAC TROMETHAMINE 30 MG/ML IJ SOLN: 30.0000 mg | Freq: Four times a day (QID) | INTRAMUSCULAR | Status: DC

## 2012-08-19 MED ORDER — PANTOPRAZOLE SODIUM 40 MG PO TBEC
40.0000 mg | DELAYED_RELEASE_TABLET | Freq: Every day | ORAL | Status: DC
  Administered 2012-08-19 – 2012-08-20 (×2): 40 mg via ORAL
  Filled 2012-08-19 (×3): qty 1

## 2012-08-19 MED ORDER — KETOROLAC TROMETHAMINE 30 MG/ML IJ SOLN
30.0000 mg | Freq: Four times a day (QID) | INTRAMUSCULAR | Status: DC
  Administered 2012-08-19 – 2012-08-20 (×4): 30 mg via INTRAVENOUS
  Filled 2012-08-19 (×4): qty 1

## 2012-08-19 MED ORDER — FENTANYL CITRATE 0.05 MG/ML IJ SOLN
INTRAMUSCULAR | Status: DC | PRN
  Administered 2012-08-19: 50 ug via INTRAVENOUS
  Administered 2012-08-19 (×2): 100 ug via INTRAVENOUS

## 2012-08-19 MED ORDER — ACETAMINOPHEN 10 MG/ML IV SOLN
INTRAVENOUS | Status: AC
  Administered 2012-08-19: 1000 mg via INTRAVENOUS
  Filled 2012-08-19: qty 100

## 2012-08-19 MED ORDER — GLYCOPYRROLATE 0.2 MG/ML IJ SOLN
INTRAMUSCULAR | Status: DC | PRN
  Administered 2012-08-19: 0.6 mg via INTRAVENOUS

## 2012-08-19 MED ORDER — HYDROMORPHONE HCL PF 1 MG/ML IJ SOLN
0.2000 mg | INTRAMUSCULAR | Status: DC | PRN
  Administered 2012-08-19: 0.6 mg via INTRAVENOUS
  Filled 2012-08-19: qty 1

## 2012-08-19 MED ORDER — LEVOTHYROXINE SODIUM 88 MCG PO TABS
88.0000 ug | ORAL_TABLET | Freq: Every day | ORAL | Status: DC
  Administered 2012-08-20: 88 ug via ORAL
  Filled 2012-08-19 (×2): qty 1

## 2012-08-19 MED ORDER — PROPOFOL 10 MG/ML IV EMUL
INTRAVENOUS | Status: AC
  Filled 2012-08-19: qty 20

## 2012-08-19 MED ORDER — FENTANYL CITRATE 0.05 MG/ML IJ SOLN
INTRAMUSCULAR | Status: AC
  Filled 2012-08-19: qty 5

## 2012-08-19 MED ORDER — GLYCOPYRROLATE 0.2 MG/ML IJ SOLN: INTRAMUSCULAR | Status: DC | PRN

## 2012-08-19 MED ORDER — NEOSTIGMINE METHYLSULFATE 1 MG/ML IJ SOLN
INTRAMUSCULAR | Status: AC
  Filled 2012-08-19: qty 1

## 2012-08-19 MED ORDER — DEXAMETHASONE SODIUM PHOSPHATE 4 MG/ML IJ SOLN
INTRAMUSCULAR | Status: DC | PRN
  Administered 2012-08-19: 8 mg via INTRAVENOUS

## 2012-08-19 MED ORDER — ONDANSETRON HCL 4 MG/2ML IJ SOLN
INTRAMUSCULAR | Status: DC | PRN
  Administered 2012-08-19: 4 mg via INTRAVENOUS

## 2012-08-19 MED ORDER — LIDOCAINE HCL (CARDIAC) 20 MG/ML IV SOLN
INTRAVENOUS | Status: DC | PRN
  Administered 2012-08-19: 40 mg via INTRAVENOUS

## 2012-08-19 MED ORDER — ROCURONIUM BROMIDE 50 MG/5ML IV SOLN
INTRAVENOUS | Status: AC
  Filled 2012-08-19: qty 1

## 2012-08-19 MED ORDER — ONDANSETRON HCL 4 MG/2ML IJ SOLN
INTRAMUSCULAR | Status: AC
  Filled 2012-08-19: qty 2

## 2012-08-19 MED ORDER — NEOSTIGMINE METHYLSULFATE 1 MG/ML IJ SOLN
INTRAMUSCULAR | Status: DC | PRN
  Administered 2012-08-19: 2.5 mg via INTRAVENOUS

## 2012-08-19 MED ORDER — PROMETHAZINE HCL 25 MG/ML IJ SOLN: 6.2500 mg | INTRAMUSCULAR | Status: DC | PRN

## 2012-08-19 MED ORDER — INDIGOTINDISULFONATE SODIUM 8 MG/ML IJ SOLN
INTRAMUSCULAR | Status: DC | PRN
  Administered 2012-08-19: 4 mL via INTRAVENOUS

## 2012-08-19 MED ORDER — BUPIVACAINE HCL (PF) 0.25 % IJ SOLN
INTRAMUSCULAR | Status: AC
  Filled 2012-08-19: qty 30

## 2012-08-19 MED ORDER — MENTHOL 3 MG MT LOZG: 1.0000 | LOZENGE | OROMUCOSAL | Status: DC | PRN

## 2012-08-19 MED ORDER — LACTATED RINGERS IV SOLN
INTRAVENOUS | Status: DC
  Administered 2012-08-19 (×2): via INTRAVENOUS

## 2012-08-19 MED ORDER — MIDAZOLAM HCL 2 MG/2ML IJ SOLN: 0.5000 mg | Freq: Once | INTRAMUSCULAR | Status: DC | PRN

## 2012-08-19 MED ORDER — MIDAZOLAM HCL 2 MG/2ML IJ SOLN
INTRAMUSCULAR | Status: AC
  Filled 2012-08-19: qty 2

## 2012-08-19 MED ORDER — PROPOFOL 10 MG/ML IV BOLUS
INTRAVENOUS | Status: DC | PRN
  Administered 2012-08-19: 120 mg via INTRAVENOUS

## 2012-08-19 MED ORDER — ONDANSETRON HCL 4 MG/2ML IJ SOLN: 4.0000 mg | Freq: Four times a day (QID) | INTRAMUSCULAR | Status: DC | PRN

## 2012-08-19 MED ORDER — LISINOPRIL 10 MG PO TABS
10.0000 mg | ORAL_TABLET | Freq: Every day | ORAL | Status: DC
  Administered 2012-08-20: 10 mg via ORAL
  Filled 2012-08-19 (×2): qty 1

## 2012-08-19 MED ORDER — CEFAZOLIN SODIUM-DEXTROSE 2-3 GM-% IV SOLR
INTRAVENOUS | Status: AC
  Filled 2012-08-19: qty 50

## 2012-08-19 MED ORDER — IBUPROFEN 800 MG PO TABS: 800.0000 mg | ORAL_TABLET | Freq: Three times a day (TID) | ORAL | Status: DC | PRN

## 2012-08-19 MED ORDER — FENTANYL CITRATE 0.05 MG/ML IJ SOLN: 25.0000 ug | INTRAMUSCULAR | Status: DC | PRN

## 2012-08-19 MED ORDER — GLYCOPYRROLATE 0.2 MG/ML IJ SOLN
INTRAMUSCULAR | Status: AC
  Filled 2012-08-19: qty 1

## 2012-08-19 MED ORDER — FELODIPINE ER 10 MG PO TB24
10.0000 mg | ORAL_TABLET | Freq: Every day | ORAL | Status: DC
  Administered 2012-08-20: 10 mg via ORAL
  Filled 2012-08-19 (×4): qty 1

## 2012-08-19 MED ORDER — LIDOCAINE-EPINEPHRINE (PF) 1 %-1:200000 IJ SOLN
INTRAMUSCULAR | Status: DC | PRN
  Administered 2012-08-19: 10 mL

## 2012-08-19 MED ORDER — KETOROLAC TROMETHAMINE 30 MG/ML IJ SOLN
INTRAMUSCULAR | Status: AC
  Filled 2012-08-19: qty 1

## 2012-08-19 MED ORDER — ZOLPIDEM TARTRATE 5 MG PO TABS: 5.0000 mg | ORAL_TABLET | Freq: Every evening | ORAL | Status: DC | PRN

## 2012-08-19 MED ORDER — ROCURONIUM BROMIDE 100 MG/10ML IV SOLN
INTRAVENOUS | Status: DC | PRN
  Administered 2012-08-19: 35 mg via INTRAVENOUS
  Administered 2012-08-19 (×2): 5 mg via INTRAVENOUS

## 2012-08-19 SURGICAL SUPPLY — 62 items
ADH SKN CLS APL DERMABOND .7 (GAUZE/BANDAGES/DRESSINGS)
BLADE SURG 15 STRL LF C SS BP (BLADE) ×3 IMPLANT
BLADE SURG 15 STRL SS (BLADE) ×4
CANISTER SUCTION 2500CC (MISCELLANEOUS) ×8 IMPLANT
CATH FOLEY 2WAY SLVR  5CC 16FR (CATHETERS)
CATH FOLEY 2WAY SLVR 5CC 16FR (CATHETERS) IMPLANT
CATH ROBINSON RED A/P 16FR (CATHETERS) ×1 IMPLANT
CLOTH BEACON ORANGE TIMEOUT ST (SAFETY) ×4 IMPLANT
CONT PATH 16OZ SNAP LID 3702 (MISCELLANEOUS) ×1 IMPLANT
CONTAINER PREFILL 10% NBF 60ML (FORM) IMPLANT
DECANTER SPIKE VIAL GLASS SM (MISCELLANEOUS) ×2 IMPLANT
DERMABOND ADVANCED (GAUZE/BANDAGES/DRESSINGS)
DERMABOND ADVANCED .7 DNX12 (GAUZE/BANDAGES/DRESSINGS) IMPLANT
DEVICE CAPIO SLIM SINGLE (INSTRUMENTS) ×1 IMPLANT
DRAIN PENROSE 1/4X12 LTX (DRAIN) ×4 IMPLANT
DRAPE HYSTEROSCOPY (DRAPE) ×4 IMPLANT
DRAPE STERI URO 9X17 APER PCH (DRAPES) ×4 IMPLANT
DRAPE UNDERBUTTOCKS STRL (DRAPE) ×4 IMPLANT
ELECT LIGASURE SHORT 9 REUSE (ELECTRODE) ×4 IMPLANT
GAUZE PACKING 2X5 YD STERILE (GAUZE/BANDAGES/DRESSINGS) IMPLANT
GAUZE SPONGE 4X4 16PLY XRAY LF (GAUZE/BANDAGES/DRESSINGS) ×3 IMPLANT
GLOVE BIO SURGEON STRL SZ 6.5 (GLOVE) ×4 IMPLANT
GLOVE BIO SURGEON STRL SZ7.5 (GLOVE) ×4 IMPLANT
GLOVE BIOGEL PI IND STRL 6.5 (GLOVE) ×3 IMPLANT
GLOVE BIOGEL PI IND STRL 7.0 (GLOVE) ×3 IMPLANT
GLOVE BIOGEL PI INDICATOR 6.5 (GLOVE) ×1
GLOVE BIOGEL PI INDICATOR 7.0 (GLOVE) ×1
GOWN PREVENTION PLUS LG XLONG (DISPOSABLE) ×16 IMPLANT
GOWN STRL REIN XL XLG (GOWN DISPOSABLE) ×16 IMPLANT
NDL SPNL 22GX3.5 QUINCKE BK (NEEDLE) IMPLANT
NEEDLE HYPO 22GX1.5 SAFETY (NEEDLE) ×4 IMPLANT
NEEDLE MAYO .5 CIRCLE (NEEDLE) ×1 IMPLANT
NEEDLE SPNL 22GX3.5 QUINCKE BK (NEEDLE) ×8 IMPLANT
NS IRRIG 1000ML POUR BTL (IV SOLUTION) ×8 IMPLANT
PACK VAGINAL WOMENS (CUSTOM PROCEDURE TRAY) ×4 IMPLANT
PAD OB MATERNITY 4.3X12.25 (PERSONAL CARE ITEMS) ×4 IMPLANT
PENCIL BUTTON HOLSTER BLD 10FT (ELECTRODE) ×4 IMPLANT
PLUG CATH AND CAP STER (CATHETERS) ×4 IMPLANT
RETRACTOR STAY HOOK 5MM (MISCELLANEOUS) ×4 IMPLANT
SET CYSTO W/LG BORE CLAMP LF (SET/KITS/TRAYS/PACK) ×4 IMPLANT
SHEET LAVH (DRAPES) ×4 IMPLANT
SUT CAPIO ETHIBPND (SUTURE) ×2 IMPLANT
SUT SILK 2 0 FS (SUTURE) IMPLANT
SUT VIC AB 0 CT1 18XCR BRD8 (SUTURE) ×9 IMPLANT
SUT VIC AB 0 CT1 27 (SUTURE) ×8
SUT VIC AB 0 CT1 27XBRD ANBCTR (SUTURE) ×6 IMPLANT
SUT VIC AB 0 CT1 8-18 (SUTURE) ×12
SUT VIC AB 0 CT2 27 (SUTURE) IMPLANT
SUT VIC AB 2-0 CT1 (SUTURE) IMPLANT
SUT VIC AB 2-0 SH 27 (SUTURE) ×28
SUT VIC AB 2-0 SH 27XBRD (SUTURE) IMPLANT
SUT VIC AB 3-0 CT1 27 (SUTURE) ×4
SUT VIC AB 3-0 CT1 TAPERPNT 27 (SUTURE) ×3 IMPLANT
SUT VIC AB 3-0 PS2 18 (SUTURE)
SUT VIC AB 3-0 PS2 18XBRD (SUTURE) IMPLANT
SUT VICRYL 0 TIES 12 18 (SUTURE) IMPLANT
SUT VICRYL 0 UR6 27IN ABS (SUTURE) ×2 IMPLANT
TOWEL OR 17X24 6PK STRL BLUE (TOWEL DISPOSABLE) ×16 IMPLANT
TRAY FOLEY CATH 14FR (SET/KITS/TRAYS/PACK) ×8 IMPLANT
TUBING CONNECTING 10 (TUBING) ×4 IMPLANT
TUBING NON-CON 1/4 X 20 CONN (TUBING) ×4 IMPLANT
WATER STERILE IRR 1000ML POUR (IV SOLUTION) ×8 IMPLANT

## 2012-08-19 NOTE — Anesthesia Postprocedure Evaluation (Signed)
  Anesthesia Post-op Note  Patient: Breanna [name]  Procedure(s) Performed: Procedure(s): HYSTERECTOMY VAGINAL (N/A) SALPINGO OOPHORECTOMY (Bilateral) CYSTOSCOPY (N/A) ANTERIOR REPAIR (CYSTOCELE) (N/A) VAULT PROLAPSE WITH GRAFT (N/A)  Patient Location: PACU  Anesthesia Type:General  Level of Consciousness: awake, alert  and oriented  Airway and Oxygen Therapy: Patient Spontanous Breathing  Post-op Pain: none  Post-op Assessment: Post-op Vital signs reviewed, Patient's Cardiovascular Status Stable, Respiratory Function Stable, Patent Airway, No signs of Nausea or vomiting and Pain level controlled  Post-op Vital Signs: Reviewed and stable  Complications: No apparent anesthesia complications

## 2012-08-19 NOTE — Anesthesia Postprocedure Evaluation (Signed)
  Anesthesia Post-op Note  Patient: Breanna [name]  Procedure(s) Performed: Procedure(s): HYSTERECTOMY VAGINAL (N/A) SALPINGO OOPHORECTOMY (Bilateral) CYSTOSCOPY (N/A) ANTERIOR REPAIR (CYSTOCELE) (N/A) VAULT PROLAPSE WITH GRAFT (N/A)  Patient Location: [facility]  Anesthesia Type:General  Level of Consciousness: awake, alert  and oriented  Airway and Oxygen Therapy: Patient Spontanous Breathing and Patient connected to nasal cannula oxygen  Post-op Pain: none  Post-op Assessment: Post-op Vital signs reviewed and Patient's Cardiovascular Status Stable  Post-op Vital Signs: Reviewed and stable  Complications: No apparent anesthesia complications

## 2012-08-19 NOTE — Anesthesia Preprocedure Evaluation (Signed)
Anesthesia Evaluation  Patient identified by MRN, date of birth, ID band Patient awake    Reviewed: Allergy & Precautions, H&P , Patient's Chart, lab work & pertinent test results, reviewed documented beta blocker date and time   History of Anesthesia Complications Negative for: history of anesthetic complications  Airway Mallampati: II TM Distance: >3 FB Neck ROM: full    Dental no notable dental hx.    Pulmonary neg pulmonary ROS,  breath sounds clear to auscultation  Pulmonary exam normal       Cardiovascular Exercise Tolerance: Good hypertension, negative cardio ROS  Rhythm:regular Rate:Normal     Neuro/Psych negative neurological ROS  negative psych ROS   GI/Hepatic negative GI ROS, Neg liver ROS,   Endo/Other  negative endocrine ROSHypothyroidism   Renal/GU negative Renal ROS     Musculoskeletal   Abdominal   Peds  Hematology negative hematology ROS (+)   Anesthesia Other Findings Hypertension     Osteoporosis, senile   post menopausal    Hypothyroidism     Vertigo Nov 2013 Work up at [employer] for stroke-tests negative, no further problem    Reproductive/Obstetrics negative OB ROS                           Anesthesia Physical Anesthesia Plan  ASA: II  Anesthesia Plan: General ETT   Post-op Pain Management:    Induction:   Airway Management Planned:   Additional Equipment:   Intra-op Plan:   Post-operative Plan:   Informed Consent: I have reviewed the patients History and Physical, chart, labs and discussed the procedure including the risks, benefits and alternatives for the proposed anesthesia with the patient or authorized representative who has indicated his/her understanding and acceptance.   Dental Advisory Given  Plan Discussed with: CRNA and Surgeon  Anesthesia Plan Comments:         Anesthesia Quick Evaluation

## 2012-08-19 NOTE — H&P (Signed)
History of Present Illness   Breanna [name] has mild nocturia and minimal voiding dysfunction. She has pelvic organ prolapse with uterine descensus and a grade 2 cystocele. She decided to go ahead with urodynamics. She was here to discuss them.   Review of Systems: No other change in bowel or neurologic systems.   She did not void prior to the study and was catheterized for 75 mL. Her maximum capacity was 640 mL. Bladder was stable. She did not leak with a Valsalva pressure of 118 cm of water with and without the prolapse reduced. During voluntary voiding, there was artifact and loss of catheter. We felt that she generated a detrusor contraction at approximately 8 cm of water. EMG activity increased, but it may be due to artifact. She was doing some abdominal straining. She had a very cystocele fluoroscopically. Bladder neck descended as noted. The details of the urodynamics are signed and dictated on the urodynamic sheet.   [name] initial bladder scan residual was 245 mL. I question whether or not to get a preoperative renal ultrasound.    Past Medical History Problems  1. History of  Endometrial Hyperplasia 621.30 2. History of  Hepatitis 573.3 3. History of  Hypertension 401.9 4. History of  Hyperthyroidism 242.90 5. History of  Hypothyroidism 244.9 6. History of  Nephrolithiasis V13.01 7. History of  Osteoporosis 733.00 8. History of  Urinary Tract Infection V13.02  Surgical History Problems  1. History of  Back Surgery 2. History of  Cataract Surgery 3. History of  Dilation And Curettage 4. History of  Hysteroscopy 5. History of  Tonsillectomy  Current Meds 1. Aspirin 81 MG Oral Tablet; Therapy: (Recorded:18Feb2008) to 2. Caltrate 600 TABS; Therapy: (Recorded:18Feb2008) to 3. Felodipine ER TB24; Therapy: (Recorded:18May2009) to 4. Lisinopril 10 MG Oral Tablet; Therapy: (Recorded:20Dec2013) to 5. Macular Vitamin Benefit Oral Tablet; Therapy: (Recorded:20Dec2013)  to 6. Multi-Vitamin Oral Tablet; Therapy: (Recorded:18Feb2008) to 7. Ocuvite TABS; Therapy: (Recorded:18Feb2008) to 8. Reclast 5 MG/100ML Intravenous Solution; Therapy: (Recorded:20Dec2013) to 9. Synthroid 88 MCG Oral Tablet; Therapy: (Recorded:20Dec2013) to 10. Vitamin D 1000 UNIT Oral Capsule; Therapy: (Recorded:20Dec2013) to 11. Vitamin E TABS; Therapy: (Recorded:18Feb2008) to  Allergies Medication  1. Sulfa Drugs  Family History Problems  1. Family history of  Family Health Status Number Of Children 1 son 3 daughters  Social History Problems  1. Alcohol Use 2. Family history of  Death In The Family Father age 13 stroke 3. Family history of  Death In The Family Mother age 73 cancer 4. Marital History - Currently Married 5. Never A Smoker 6. Occupation: housemaker Denied  7. Caffeine Use 8. Tobacco Use  Assessment Assessed  1. Vaginal Wall Prolapse With Midline Cystocele 618.01 2. Nocturia 788.43  Plan   Discussion/Summary   I drew [name] a picture. I went over transvaginal vault suspension, cystocele repair and graft. Dr. Cherly Hensen would perform a hysterectomy.   I drew her a picture and we talked about prolapse surgery in detail. Pros, cons, general surgical and anesthetic risks, and other options including behavioral therapy, pessaries, and watchful waiting were discussed. She understands that prolapse repairs are successful in 80-85% of cases for prolapse symptoms and can recur anteriorly, posteriorly, and/or apically. She understands that in most cases I use a graft and general risks were discussed. Surgical risks were described but not limited to the discussion of injury to neighboring structures including the bowel (with possible life-threatening sepsis and colostomy), bladder, urethra, vagina (all resulting in further surgery), and ureter (  resulting in re-implantation). We talked about injury to nerves/soft tissue leading to debilitating and intractable  pelvic, abdominal, and lower extremity pain syndromes and neuropathies. The risks of buttock pain, intractable dyspareunia, and vaginal narrowing and shortening with sequelae were discussed. Bleeding risks, transfusion rates, and infection were discussed. The risk of persistent, de novo, or worsening bladder and/or bowel incontinence/dysfunction was discussed. The need for CIC was described as well the usual post-operative course. The patient understands that she might not reach her treatment goal and that she might be worse following surgery.  We talked about watchful waiting again, and a pessary.  [name] is leaning towards the pessary and wants to discuss it once again with Dr. Cherly Hensen. She will follow up with Dr. Cherly Hensen and if she decides to have surgery, she does not need another appointment and we will proceed accordingly.   After a thorough review of the management options for the patient's condition the patient  elected to proceed with surgical therapy as noted above. We have discussed the potential benefits and risks of the procedure, side effects of the proposed treatment, the likelihood of the patient achieving the goals of the procedure, and any potential problems that might occur during the procedure or recuperation. Informed consent has been obtained.

## 2012-08-19 NOTE — Brief Op Note (Signed)
08/19/2012  11:23 AM  PATIENT:  Breanna [name]  79 y.o. female  PRE-OPERATIVE DIAGNOSIS:  2nd  degree Uterovaginal prolapse, cystocele  POST-OPERATIVE DIAGNOSIS:  2nd degree  Uterovaginal prolapse, cystocele  PROCEDURE:  Total vaginal hysterectomy, bilateral salpingo-oophrectomy  SURGEON:  Surgeon(s) and Role: Panel 1:    * Malacki Mcphearson Cathie Beams, MD - Primary  Panel 2:    * Martina Sinner, MD - Primary  PHYSICIAN ASSISTANT:   ASSISTANTS: Scot McDiarmid, MD   ANESTHESIA:   general Findings: cervix visible at introitus, cystocele, vault prolapse EBL:  Total I/O In: 1600 [I.V.:1600] Out: 550 [Urine:400; Blood:150]  BLOOD ADMINISTERED:none  DRAINS: none   LOCAL MEDICATIONS USED:  Epinephrine w/ lidocaine   SPECIMEN:  Source of Specimen:  uterus w/ cervix , tubes and ovaries  DISPOSITION OF SPECIMEN:  PATHOLOGY  COUNTS:  YES  TOURNIQUET:  * No tourniquets in log *  DICTATION: .Other Dictation: Dictation Number [id number]  PLAN OF CARE: Admit to inpatient   PATIENT DISPOSITION:  PACU - hemodynamically stable.   Delay start of Pharmacological VTE agent (>24hrs) due to surgical blood loss or risk of bleeding: no

## 2012-08-19 NOTE — Op Note (Signed)
Preoperative diagnosis: Cystocele and vault prolapse Postoperative diagnosis: Cystocele vault prolapse Surgery: Vault prolapse repair for cystocele repair and graft and cystoscopy Surgeon: Dr. Lorin Picket Starlit Raburn Assistant: Nena Jordan cousins  The patient has the above diagnoses and consented the above procedure. Preoperative antibiotics were given. Extra care was taken with leg positioning to minimize the risk of compartment syndrome and neuropathy and deep vein thrombosis. I assisted Dr. cousins and performed a transvaginal hysterectomy and removal of the ovaries. The cuff was left open.  I instilled 20 cc of a lidocaine epinephrine mixture. Between Allis clamps placed at the vaginal cuff I made a long anterior vaginal wall incision and sharply dissected the underlying pubocervical fascia from the overlying thin vaginal wall mucosa. With sharp and blunt dissection I dissected to the white line bilaterally and later on the case down to the initial spine bilaterally.  Urinary hysterectomy there was a minimal thinning of the bladder. This was easily identified and part of the anterior closure. There is no significant injury to bladder. It was also checked cystoscopically and the bladder was normal  I did a 2 layer anterior repair of a small cystocele. She mainly had a trapdoor deformity. I do not imbricate the bladder neck. 2-0 Vicryl was utilized  I then cystoscoped the patient and there was no distortion of the ureters and there was excellent blue jets bilaterally with good cystoscopic repair of the cystocele  I cut a 10 x 6 dermal graft the shape of a trapezoid. I finger dissected to the initial spine bilaterally. The spine was exceptionally difficult to feel since was so flat. She had atrophy of the tissues medially and the sacrospinous ligament was very flat and small. Utilizing landmarks I placed a 0 Ethibond 1 fingerbreadth medial to the initial spine in a straight line between the spines. This  was done bilaterally. I did a rectal examination and there is no injury to rectum.  With my usual dissection I placed a 0 Vicryl into the pelvic sidewall the level of the urethrovesical angle. I used 0 Vicryl on a UR 6 needle.  10 x 6 graft was then sewn in place tension-free.  I trimmed an appropriate amount of anterior vaginal wall mucosa and close anterior vaginal wall with running 2-0 Vicryl on a CT1 needle. The ureteral sacral sutures were so flimsy they were not reapproximated in the midline since we felt there is no clinical value. The cuff was closed with 0 Vicryl on a CT1 needle  Leg position was excellent. Urine output was excellent. Urine was clear. Blood loss was approximately 200 mL. Vaginal pack with Estrace cream was applied.  She had excellent vaginal length at in the case. She had minimal posterior defect. I was very pleased with the surgery

## 2012-08-19 NOTE — Transfer of Care (Signed)
Immediate Anesthesia Transfer of Care Note  Patient: Breanna [name]  Procedure(s) Performed: Procedure(s): HYSTERECTOMY VAGINAL (N/A) SALPINGO OOPHORECTOMY (Bilateral) CYSTOSCOPY (N/A) ANTERIOR REPAIR (CYSTOCELE) (N/A) VAULT PROLAPSE WITH GRAFT (N/A)  Patient Location: PACU  Anesthesia Type:General  Level of Consciousness: awake and oriented  Airway & Oxygen Therapy: Patient Spontanous Breathing and Patient connected to nasal cannula oxygen  Post-op Assessment: Report given to PACU RN and Post -op Vital signs reviewed and stable  Post vital signs: Reviewed and stable  Complications: No apparent anesthesia complications

## 2012-08-20 ENCOUNTER — Observation Stay (HOSPITAL_COMMUNITY)
Admission: AD | Admit: 2012-08-20 | Discharge: 2012-08-21 | Disposition: A | Discharge status: Home / Self Care | Payer: Medicare Other | Source: Ambulatory Visit | Attending: Obstetrics and Gynecology | Admitting: Obstetrics and Gynecology

## 2012-08-20 ENCOUNTER — Inpatient Hospital Stay (HOSPITAL_COMMUNITY): Payer: Medicare Other | Admitting: Obstetrics and Gynecology

## 2012-08-20 DIAGNOSIS — R109 Unspecified abdominal pain: Principal | ICD-10-CM | POA: Insufficient documentation

## 2012-08-20 DIAGNOSIS — R110 Nausea: Secondary | ICD-10-CM | POA: Insufficient documentation

## 2012-08-20 DIAGNOSIS — Z90710 Acquired absence of both cervix and uterus: Secondary | ICD-10-CM | POA: Insufficient documentation

## 2012-08-20 DIAGNOSIS — J438 Other emphysema: Secondary | ICD-10-CM | POA: Insufficient documentation

## 2012-08-20 DIAGNOSIS — J984 Other disorders of lung: Secondary | ICD-10-CM | POA: Insufficient documentation

## 2012-08-20 LAB — BASIC METABOLIC PANEL
CO2: 25 mEq/L (ref 19–32)
Calcium: 8.7 mg/dL (ref 8.4–10.5)
Creatinine, Ser: 0.9 mg/dL (ref 0.50–1.10)
Glucose, Bld: 93 mg/dL (ref 70–99)

## 2012-08-20 LAB — CBC
MCH: 32.7 pg (ref 26.0–34.0)
MCV: 95.1 fL (ref 78.0–100.0)
Platelets: 274 10*3/uL (ref 150–400)
RDW: 14.9 % (ref 11.5–15.5)

## 2012-08-20 MED ORDER — ONDANSETRON 8 MG PO TBDP
8.0000 mg | ORAL_TABLET | Freq: Once | ORAL | Status: AC
  Administered 2012-08-21: 8 mg via ORAL
  Filled 2012-08-20: qty 1

## 2012-08-20 MED ORDER — IBUPROFEN 800 MG PO TABS: 800.0000 mg | ORAL_TABLET | Freq: Three times a day (TID) | ORAL | Status: DC | PRN

## 2012-08-20 MED ORDER — HYDROCODONE-ACETAMINOPHEN 5-300 MG PO TABS: 1.0000 | ORAL_TABLET | Freq: Four times a day (QID) | ORAL | Status: DC | PRN

## 2012-08-20 NOTE — Progress Notes (Addendum)
Patient unable to void and feeling a lot of pressure causing her  Severe pain.  Dr. Dareen Piano notified and orders received.  Will insert foley cath. And discharge home and return to office on Friday.

## 2012-08-20 NOTE — Discharge Summary (Signed)
Physician Discharge Summary  Patient ID: SHANOAH ASBILL MRN: [id number] DOB/AGE: Jun 28, 1930 79 y.o.  Admit date: 08/19/2012 Discharge date: 08/20/2012  Admission Diagnoses: 2nd deg uterovaginal prolapse, cystocele, vault prolapse  Discharge Diagnoses:  Same, urinary retention Active Problems:   * No active hospital problems. *   Discharged Condition: stable  Hospital Course: Pt underwent TVH BSO, vault suspension  W/ graft , cystocele repair w/o complication. Pt underwent voiding trials per dr Perley Jain. Tolerated reg diet. Min pain med use  Consults: None  Significant Diagnostic Studies: labs:  BMET    Component Value Date/Time   NA 128* 08/20/2012 0525   K 4.4 08/20/2012 0525   CL 95* 08/20/2012 0525   CO2 25 08/20/2012 0525   GLUCOSE 93 08/20/2012 0525   BUN 17 08/20/2012 0525   CREATININE 0.90 08/20/2012 0525   CALCIUM 8.7 08/20/2012 0525   GFRNONAA 59* 08/20/2012 0525   GFRAA 69* 08/20/2012 0525     CBC    Component Value Date/Time   WBC 12.0* 08/20/2012 0525   RBC 3.06* 08/20/2012 0525   HGB 10.0* 08/20/2012 0525   HCT 29.1* 08/20/2012 0525   PLT 274 08/20/2012 0525   MCV 95.1 08/20/2012 0525   MCH 32.7 08/20/2012 0525   MCHC 34.4 08/20/2012 0525   RDW 14.9 08/20/2012 0525   LYMPHSABS 1.2 02/16/2012 2104   MONOABS 0.9 02/16/2012 2104   EOSABS 0.0 02/16/2012 2104   BASOSABS 0.0 02/16/2012 2104      Treatments: surgery: TVHBSO, vaginal vault suspension, cystocele repair  Discharge Exam: Blood pressure 119/55, pulse 73, temperature 97.9 F (36.6 C), temperature source Oral, resp. rate 18, height 5\' 1"  (1.549 m), weight 52.164 kg (115 lb), SpO2 99.00%. General appearance: alert, cooperative and no distress Resp: clear to auscultation bilaterally Cardio: regular rate and rhythm, S1, S2 normal, no murmur, click, rub or gallop GI: soft, non-tender; bowel sounds normal; no masses,  no organomegaly Pelvic: deferred and pad scant blood Extremities: no edema, redness or  tenderness in the calves or thighs  Disposition: 01-Home or Self Care  Discharge Orders   Future Orders Complete By Expires     Discharge patient  As directed         Medication List    TAKE these medications       aspirin EC 81 MG tablet  Take 81 mg by mouth daily.     Biotin 5000 MCG Caps  Take 5,000 mcg by mouth daily.     felodipine 10 MG 24 hr tablet  Commonly known as:  PLENDIL  Take 10 mg by mouth daily.     Hydrocodone-Acetaminophen 5-300 MG Tabs  Take 1 tablet by mouth 4 (four) times daily as needed.     ibuprofen 800 MG tablet  Commonly known as:  ADVIL,MOTRIN  Take 1 tablet (800 mg total) by mouth every 8 (eight) hours as needed (mild pain).     levothyroxine 88 MCG tablet  Commonly known as:  SYNTHROID, LEVOTHROID  Take 88 mcg by mouth daily.     lisinopril 10 MG tablet  Commonly known as:  PRINIVIL,ZESTRIL  Take 10 mg by mouth daily.     multivitamin with minerals Tabs  Take 1 tablet by mouth daily. Macular Protect.     SYSTANE OP  Apply 2 drops to eye daily as needed (for dry eyes.).           Follow-up Information   Follow up with Kaylob Wallen A, MD In 6 weeks.  Contact information:   [address] [place] [address] [phone]       Signed: Aryn Kops A 08/20/2012, 8:33 AM

## 2012-08-20 NOTE — Op Note (Signed)
Breanna [name], Breanna [name] NO.:  [id number]  MEDICAL RECORD NO.:  [id number]  LOCATION:  9303                          FACILITY:  [facility]  PHYSICIAN:  Maxie Better, M.D.DATE OF BIRTH:  Aug 28, 1930  DATE OF PROCEDURE:  08/19/2012 DATE OF DISCHARGE:                              OPERATIVE REPORT   PREOPERATIVE DIAGNOSIS:  Second-degree uterovaginal prolapse, cystocele.  PROCEDURE:  Total vaginal hysterectomy, bilateral salpingo-oophorectomy.  POSTOPERATIVE DIAGNOSIS:  Second-degree uterovaginal prolapse, cytocele.  ANESTHESIA:  General.  SURGEON:  Maxie Better, MD  ASSISTANT:  Alfredo Martinez, MD  PROCEDURE:  Under adequate general anesthesia, the patient was placed in a dorsal lithotomy position.  It was visible  cervix and bladder at the introitus. She was sterilely prepped and draped in usual fashion.  An indwelling Foley catheter was sterilely placed.  A weighted speculum was placed in the vagina.  Sims retractor was placed anteriorly.  The cervix was grasped with Jacobson clamp in the anterior and posterior lip.  Dilute solution of 1% lidocaine with 1:200,000 with epinephrine was injected circumferentially at what appears to be the cervicovaginal junction. Circumferential incision was then made and the mucosa was then undermined with sharp dissection. Anterior cul de sac dissection did not lead to area being opened.  The posterior cul-de-sac was subsequently opened and the peritoneum was entered sharply and extended. Using a finger, hooking posteriorly to reach the anterior cul de sac, the bladder was better demarcated for dissection.  The vaginal cuff posteriorly had O-Vicryl running locked stitch placement  for better hemostasis.  The uterosacral ligaments were then bilaterally clamped, cut, suture ligated with 0 Vicryl x2.  The right uterosacral ligament was very small and not well developed.  Attention was then brought back to the  bladder peritoneum. Continued dissection was occurred until the anterior cul-de- sac was opened.  Using the gyrus, the cardinal ligaments were bilaterally clamped, cauterized, and then cut followed by the uterine vessels which were bilaterally clamped, cauterized, and cut.  At that point, the descent of the uterus further resulted in the ovaries being seen.  With careful packing, the bowels were packed upwardly.  The patient was placed into deep Trendelenburg.  The ovary on the left was also noted. It contained a clear small fluid filled cyst. Traction on the ovary resulted in the end of the left tube to be seen  IP ligament was then subsequently clamped, cauterized, and then cut. On the opposite side, normal right ovary was identified.   The tube was otherwise normal and again using the gyrus apparatus, the IP ligament was serially clamped, cauterized and cut.  The uterus, tubes, and ovaries were then severed from its attachment.  Good hemostasis was noted. The case was then turned over to Dr. Sherron Monday.  Please see his dictated operative report for his portion of the surgery.  Specimen  was the uterus, cervix, tubes, and ovaries sent to Pathology.  Estimated blood loss for my portion of the surgery was 50 mL.  INTRAVENOUS FLUIDS:  1500 mL.  URINE OUTPUT:  400 mL, clear yellow urine.  COUNTS:  Sponge and instrument counts x2 was correct.  COMPLICATION:  None.  The patient tolerated procedure well and was  transferred to recovery in stable condition.     Maxie Better, M.D.     [place]/[facility]  D:  08/19/2012  T:  08/20/2012  Job:  [id number]

## 2012-08-20 NOTE — Progress Notes (Signed)
Vitals normal Laboratory tests normal Patient alert and stable Pain minimal and well-controlled Post treatment course discussed in detail Followup discussed in detail See orders 

## 2012-08-20 NOTE — Progress Notes (Signed)
Ur chart review completed.  

## 2012-08-20 NOTE — MAU Note (Signed)
Pt went home today after being post op hysterectomy and bladder tacking. Pt became nauseous after taking po pain medication in the hospital and has not been able to keep anything on her stomach since then

## 2012-08-20 NOTE — Progress Notes (Signed)
Vaginal packing removed. Scant drainage, no clots present. Patient tolerated well.

## 2012-08-20 NOTE — Progress Notes (Signed)
Dr. Randall An updated on patients progress.  Orders received and foley carth. #14 reinserted.  Patient tolerated without problems.

## 2012-08-20 NOTE — Progress Notes (Signed)
Inserted #14 foley catheter.  Immediate return of clear, yellow urine.  Patient tolerated well.

## 2012-08-20 NOTE — Progress Notes (Signed)
Dr. Sherron Monday updated that patient had not been able to void and bladder scan showed 315 cc.  Patient has no discomfort.  Dr. Sherron Monday would like to give her 2 more hours to void and then re-update him on her progress.

## 2012-08-20 NOTE — Progress Notes (Signed)
Subjective: Patient reports tolerating PO and no problems voiding.    Objective: I have reviewed patient's vital signs.  vital signs, intake and output and medications. Filed Vitals:   08/20/12 0545  BP: 119/55  Pulse: 73  Temp: 97.9 F (36.6 C)  Resp: 18   I/O last 3 completed shifts: In: 2655.7 [I.V.:2655.7] Out: 1225 [Urine:1075; Blood:150]    Lab Results  Component Value Date   WBC 12.0* 08/20/2012   HGB 10.0* 08/20/2012   HCT 29.1* 08/20/2012   MCV 95.1 08/20/2012   PLT 274 08/20/2012   Lab Results  Component Value Date   CREATININE 0.90 08/20/2012    EXAM General: alert, cooperative and appears stated age Resp: clear to auscultation bilaterally Cardio: regular rate and rhythm, S1, S2 normal, no murmur, click, rub or gallop GI: soft, non-tender; bowel sounds normal; no masses,  no organomegaly Extremities: no edema, redness or tenderness in the calves or thighs Vaginal Bleeding: minimal  Assessment: s/p Procedure(s): HYSTERECTOMY VAGINAL, bilateral SALPINGO OOPHORECTOMY CYSTOSCOPY ANTERIOR REPAIR (CYSTOCELE) VAULT PROLAPSE WITH GRAFT: stable  Plan: Advance diet Encourage ambulation Advance to PO medication Discharge home  LOS: 1 day  P) f/u 6 weeks. D/c instructions given/reviewed  Clarnce Homan A, MD 08/20/2012 8:26 AM    08/20/2012, 8:26 AM

## 2012-08-20 NOTE — Progress Notes (Signed)
Patient discharged home with family. Taken down to main entrance via wheelchair by P. Cates, NT. No home health or equipment needed. Patient in stable condition.

## 2012-08-21 ENCOUNTER — Inpatient Hospital Stay (HOSPITAL_COMMUNITY): Payer: Medicare Other

## 2012-08-21 DIAGNOSIS — J438 Other emphysema: Secondary | ICD-10-CM | POA: Diagnosis not present

## 2012-08-21 LAB — URINALYSIS, ROUTINE W REFLEX MICROSCOPIC
Protein, ur: NEGATIVE mg/dL
Urobilinogen, UA: 0.2 mg/dL (ref 0.0–1.0)

## 2012-08-21 LAB — URINE MICROSCOPIC-ADD ON

## 2012-08-21 MED ORDER — SODIUM CHLORIDE 0.9 % IJ SOLN
3.0000 mL | Freq: Two times a day (BID) | INTRAMUSCULAR | Status: DC
Start: 2012-08-21 — End: 2012-08-21
  Administered 2012-08-21: 3 mL via INTRAVENOUS

## 2012-08-21 MED ORDER — HYDROMORPHONE HCL PF 1 MG/ML IJ SOLN: 0.2000 mg | INTRAMUSCULAR | Status: DC | PRN

## 2012-08-21 MED ORDER — PROMETHAZINE HCL 25 MG/ML IJ SOLN
12.5000 mg | Freq: Four times a day (QID) | INTRAMUSCULAR | Status: DC | PRN
  Administered 2012-08-21: 12.5 mg via INTRAVENOUS
  Filled 2012-08-21: qty 1

## 2012-08-21 MED ORDER — SIMETHICONE 80 MG PO CHEW
80.0000 mg | CHEWABLE_TABLET | Freq: Four times a day (QID) | ORAL | Status: DC | PRN
  Filled 2012-08-21: qty 1

## 2012-08-21 MED ORDER — SODIUM CHLORIDE 0.9 % IJ SOLN: 3.0000 mL | INTRAMUSCULAR | Status: DC | PRN

## 2012-08-21 MED ORDER — SODIUM CHLORIDE 0.9 % IV SOLN: 250.0000 mL | INTRAVENOUS | Status: DC | PRN

## 2012-08-21 MED ORDER — SENNA 8.6 MG PO TABS
1.0000 | ORAL_TABLET | Freq: Two times a day (BID) | ORAL | Status: DC
  Filled 2012-08-21 (×2): qty 1

## 2012-08-21 MED ORDER — PROMETHAZINE HCL 25 MG PO TABS: 25.0000 mg | ORAL_TABLET | Freq: Four times a day (QID) | ORAL | Status: DC | PRN

## 2012-08-21 MED ORDER — IBUPROFEN 800 MG PO TABS: 800.0000 mg | ORAL_TABLET | Freq: Three times a day (TID) | ORAL | Status: DC | PRN

## 2012-08-21 NOTE — Progress Notes (Signed)
Discharged pt home with family. Pt states "I feel so much better today".

## 2012-08-21 NOTE — H&P (Signed)
Breanna [name] is an 79 y.o. female. Presents for evaluation due to inability to keep anything down, nausea/vomiting since percocet given earlier for postoperative pain. Per pt's son in law, unable to keep even ice chip down.  Pt reports working on bowels prior to surgery by using metamucil and eating fruits and vegetables. Foley catheter draining large amount of urine. Given zofran ODT but did not note any change in nausea but med did not melt due to dry mouth  Pertinent Gynecological History: Menses:  No LMP recorded. Patient is postmenopausal.    Past Medical History  Diagnosis Date  . Hypertension   . Osteoporosis, senile     post menopausal  . Hypothyroidism   . Vertigo Nov 2013    Work up at [facility] for stroke-tests negative, no further problem    Past Surgical History  Procedure Laterality Date  . Back surgery  2006  . Tonsillectomy    . Tubal ligation    . Vaginal hysterectomy N/A 08/19/2012    Procedure: HYSTERECTOMY VAGINAL;  Surgeon: Serita Kyle, MD;  Location: [facility];  Service: Gynecology;  Laterality: N/A;  . Salpingoophorectomy Bilateral 08/19/2012    Procedure: SALPINGO OOPHORECTOMY;  Surgeon: Serita Kyle, MD;  Location: [facility];  Service: Gynecology;  Laterality: Bilateral;  . Cystoscopy N/A 08/19/2012    Procedure: CYSTOSCOPY;  Surgeon: Martina Sinner, MD;  Location: [facility];  Service: Urology;  Laterality: N/A;  . Cystocele repair N/A 08/19/2012    Procedure: ANTERIOR REPAIR (CYSTOCELE);  Surgeon: Martina Sinner, MD;  Location: [facility];  Service: Urology;  Laterality: N/A;  . Vaginal prolapse repair N/A 08/19/2012    Procedure: VAULT PROLAPSE WITH GRAFT;  Surgeon: Martina Sinner, MD;  Location: [facility];  Service: Urology;  Laterality: N/A;    Family History  Problem Relation Age of Onset  . Stroke Father   . Cancer - Colon Mother     Social History:  reports that she has never smoked. She has never used smokeless tobacco. She reports  that  drinks alcohol. She reports that she does not use illicit drugs.  Allergies:  Allergies  Allergen Reactions  . Sulfa Antibiotics Nausea And Vomiting    Prescriptions prior to admission  Medication Sig Dispense Refill  . aspirin EC 81 MG tablet Take 81 mg by mouth daily.      . Biotin 5000 MCG CAPS Take 5,000 mcg by mouth daily.      . felodipine (PLENDIL) 10 MG 24 hr tablet Take 10 mg by mouth daily.      . Hydrocodone-Acetaminophen 5-300 MG TABS Take 1 tablet by mouth 4 (four) times daily as needed.  15 each  0  . ibuprofen (ADVIL,MOTRIN) 800 MG tablet Take 1 tablet (800 mg total) by mouth every 8 (eight) hours as needed (mild pain).  30 tablet  0  . levothyroxine (SYNTHROID, LEVOTHROID) 88 MCG tablet Take 88 mcg by mouth daily.      Marland Kitchen lisinopril (PRINIVIL,ZESTRIL) 10 MG tablet Take 10 mg by mouth daily.      . Multiple Vitamin (MULTIVITAMIN WITH MINERALS) TABS Take 1 tablet by mouth daily. Macular Protect.      Bertram Gala Glycol-Propyl Glycol (SYSTANE OP) Apply 2 drops to eye daily as needed (for dry eyes.).        Review of Systems  Gastrointestinal: Positive for nausea and vomiting.    Blood pressure 156/61, pulse 97, temperature 97.1 F (36.2 C), temperature source Oral, resp. rate  20. Physical Exam  Constitutional: She is oriented to person, place, and time. She appears well-developed. No distress.  HENT:  Head: Atraumatic.  Neck: Neck supple.  Cardiovascular: Regular rhythm.   Respiratory: Breath sounds normal.  GI: Soft. Bowel sounds are normal. She exhibits no distension. There is no tenderness.  Genitourinary:  Pad scant brown blood  Musculoskeletal: She exhibits no edema.  Neurological: She is oriented to person, place, and time.  Skin: Skin is warm and dry.    Results for orders placed during the hospital encounter of 08/20/12 (from the past 24 hour(s))  URINALYSIS, ROUTINE W REFLEX MICROSCOPIC     Status: Abnormal   Collection Time    08/20/12 11:55 PM       Result Value Range   Color, Urine YELLOW  YELLOW   APPearance CLEAR  CLEAR   Specific Gravity, Urine 1.010  1.005 - 1.030   pH 8.0  5.0 - 8.0   Glucose, UA NEGATIVE  NEGATIVE mg/dL   Hgb urine dipstick TRACE (*) NEGATIVE   Bilirubin Urine NEGATIVE  NEGATIVE   Ketones, ur 15 (*) NEGATIVE mg/dL   Protein, ur NEGATIVE  NEGATIVE mg/dL   Urobilinogen, UA 0.2  0.0 - 1.0 mg/dL   Nitrite NEGATIVE  NEGATIVE   Leukocytes, UA NEGATIVE  NEGATIVE  URINE MICROSCOPIC-ADD ON     Status: None   Collection Time    08/20/12 11:55 PM      Result Value Range   Squamous Epithelial / LPF RARE  RARE   WBC, UA 0-2  <3 WBC/hpf   RBC / HPF 0-2  <3 RBC/hpf   Bacteria, UA RARE  RARE   Urine-Other AMORPHOUS URATES/PHOSPHATES      Dg Abd Acute W/chest  08/21/2012   *RADIOLOGY REPORT*  Clinical Data: Abdominal pain.  ACUTE ABDOMEN SERIES (ABDOMEN 2 VIEW & CHEST 1 VIEW)  Comparison: 07/13/2006.  Findings: The upright chest x-ray demonstrates severe emphysematous changes and advanced pulmonary scarring.  Two views of the abdomen demonstrate moderate stool throughout the colon down into the rectum which may suggest constipation.  No findings for small bowel obstruction or free air.  The soft tissue shadows are maintained.  No worrisome calcifications.  The bony structures are intact.  IMPRESSION:  1.  Severe emphysematous changes and pulmonary scarring. 2.  Moderate stool throughout the colon suggesting constipation. No findings for small bowel obstruction or free air.   Original Report Authenticated By: Rudie Meyer, M.D.  reviewed Xray results with pt and family Urine output: 800cc  Assessment/Plan: Postoperative nausea/vomiting Constipation P) admit. IV phenergan. ADAT. Bowel regimen(miralax/colace)   Tenya Araque A 08/21/2012, 2:07 AM

## 2012-08-22 DIAGNOSIS — R339 Retention of urine, unspecified: Secondary | ICD-10-CM | POA: Diagnosis not present

## 2012-08-23 ENCOUNTER — Encounter (HOSPITAL_COMMUNITY): Payer: Self-pay | Admitting: Family

## 2012-08-23 ENCOUNTER — Inpatient Hospital Stay (HOSPITAL_COMMUNITY): Payer: Medicare Other

## 2012-08-23 ENCOUNTER — Inpatient Hospital Stay (HOSPITAL_COMMUNITY)
Admission: AD | Admit: 2012-08-23 | Discharge: 2012-08-23 | Disposition: A | Discharge status: Home / Self Care | Payer: Medicare Other | Source: Ambulatory Visit | Attending: Obstetrics & Gynecology | Admitting: Obstetrics & Gynecology

## 2012-08-23 DIAGNOSIS — Z90710 Acquired absence of both cervix and uterus: Secondary | ICD-10-CM | POA: Insufficient documentation

## 2012-08-23 DIAGNOSIS — R339 Retention of urine, unspecified: Secondary | ICD-10-CM | POA: Diagnosis not present

## 2012-08-23 DIAGNOSIS — K6389 Other specified diseases of intestine: Secondary | ICD-10-CM | POA: Diagnosis not present

## 2012-08-23 DIAGNOSIS — N9989 Other postprocedural complications and disorders of genitourinary system: Secondary | ICD-10-CM | POA: Insufficient documentation

## 2012-08-23 DIAGNOSIS — K5900 Constipation, unspecified: Secondary | ICD-10-CM | POA: Diagnosis not present

## 2012-08-23 DIAGNOSIS — IMO0002 Reserved for concepts with insufficient information to code with codable children: Secondary | ICD-10-CM | POA: Insufficient documentation

## 2012-08-23 MED ORDER — POLYETHYLENE GLYCOL 3350 17 G PO PACK: 17.0000 g | PACK | ORAL | Status: AC

## 2012-08-23 MED ORDER — DOCUSATE SODIUM 100 MG PO CAPS: 100.0000 mg | ORAL_CAPSULE | Freq: Every day | ORAL | Status: DC

## 2012-08-23 NOTE — MAU Note (Signed)
Post op on tues. 08/19/12 had vaginal hysterectomy.  Unable to have a BM since surgery. Been taking colace and Doculax sup  And fleet enema without  Relief. Pt stated she had not void today. Stated she had been up all night voiding.

## 2012-08-23 NOTE — MAU Provider Note (Signed)
History     CSN: [id number]  Arrival date and time: 08/23/12 1419 Call from Dr Seymour Bars to evaluate patient @ 1540 Provider in to examine patient @ 1545    Chief Complaint  Patient presents with  . Constipation   HPI  S/p vaginal hysterectomy and cystoscopy 6 days ago Urinary catheter in place postoperatively until yesterday  voiding without difficulty until 0600 this am when unable to void Constipation since discharge - used glycerin suppositories x 2 and Fleets enema without relief Told not to strain to have BM - unable to pass stool C/o intense pain and pressure in vagina  Past Medical History  Diagnosis Date  . Hypertension   . Osteoporosis, senile     post menopausal  . Hypothyroidism   . Vertigo Nov 2013    Work up at [facility] for stroke-tests negative, no further problem    Past Surgical History  Procedure Laterality Date  . Back surgery  2006  . Tonsillectomy    . Tubal ligation    . Vaginal hysterectomy N/A 08/19/2012    Procedure: HYSTERECTOMY VAGINAL;  Surgeon: Serita Kyle, MD;  Location: [facility];  Service: Gynecology;  Laterality: N/A;  . Salpingoophorectomy Bilateral 08/19/2012    Procedure: SALPINGO OOPHORECTOMY;  Surgeon: Serita Kyle, MD;  Location: [facility];  Service: Gynecology;  Laterality: Bilateral;  . Cystoscopy N/A 08/19/2012    Procedure: CYSTOSCOPY;  Surgeon: Martina Sinner, MD;  Location: [facility];  Service: Urology;  Laterality: N/A;  . Cystocele repair N/A 08/19/2012    Procedure: ANTERIOR REPAIR (CYSTOCELE);  Surgeon: Martina Sinner, MD;  Location: [facility];  Service: Urology;  Laterality: N/A;  . Vaginal prolapse repair N/A 08/19/2012    Procedure: VAULT PROLAPSE WITH GRAFT;  Surgeon: Martina Sinner, MD;  Location: [facility];  Service: Urology;  Laterality: N/A;    Family History  Problem Relation Age of Onset  . Stroke Father   . Cancer - Colon Mother     History  Substance Use Topics  . Smoking status: Never Smoker   .  Smokeless tobacco: Never Used  . Alcohol Use: Yes     Comment: socially drinks wine    Allergies:  Allergies  Allergen Reactions  . Percocet (Oxycodone-Acetaminophen) Nausea And Vomiting  . Sulfa Antibiotics Nausea And Vomiting    Prescriptions prior to admission  Medication Sig Dispense Refill  . aspirin EC 81 MG tablet Take 81 mg by mouth daily.      . Biotin 5000 MCG CAPS Take 5,000 mcg by mouth daily.      . felodipine (PLENDIL) 10 MG 24 hr tablet Take 10 mg by mouth daily.      Marland Kitchen levothyroxine (SYNTHROID, LEVOTHROID) 88 MCG tablet Take 88 mcg by mouth daily before breakfast.      . lisinopril (PRINIVIL,ZESTRIL) 10 MG tablet Take 10 mg by mouth daily.      . Multiple Vitamin (MULTIVITAMIN WITH MINERALS) TABS Take 1 tablet by mouth daily. Macular Protect.      Bertram Gala Glycol-Propyl Glycol (SYSTANE OP) Apply 2 drops to eye daily as needed (for dry eyes.).      Marland Kitchen Hydrocodone-Acetaminophen 5-300 MG TABS Take 1 tablet by mouth 4 (four) times daily as needed.  15 each  0  . ibuprofen (ADVIL,MOTRIN) 800 MG tablet Take 1 tablet (800 mg total) by mouth every 8 (eight) hours as needed (mild pain).  30 tablet  0    ROS Physical Exam   Blood  pressure 112/60, pulse 100, temperature 98.1 F (36.7 C), temperature source Oral, resp. rate 18, height 5' (1.524 m), weight 54.976 kg (121 lb 3.2 oz).  Physical Exam  Ambulatory with moderate pain Lungs - clear Abdomen-          upper abdomen soft and non-distended with active BS           lower abdomen distended and tender to palpation / hypoactive BS present  Genitalia -       vaginal introitus bulging open with significantly distended posterior vaginal wall to level of bladder      inability to assess vagina due to over distended rectum from stool and overdistended bladder with urine  straight cath done to decompress bladder with 550 ml out / clear yellow urine vagina assessed with large amount stool distorting vaginal floor -  posterior wall with sutures intact / no bleeding manual compression and removal of large amount of hard stool - 3large pieces of stools 3cm to 5cm size small watery stool passed after removal of impaction  Rectal-          hemorrhoids present with bulging of anus and scant water stool leakage present after removal of impaction           MAU Course  Procedures  Disimpacted Straight cath for urinary retention X-Ray  - no ileus / large amount of stool in lower colon  SSE - passage of large amount of stool  Assessment and Plan   POD #6 s/p vaginal hysterectomy Constipation with impaction Urinary retention from bowel overdistention  consulted with Dr Seymour Bars with plan of care - agrees  1) bladder decompressed - able to void without difficulty s/p catheterization 2) XRay - no ileus / lower colon full of stool 3) SSE  4) discharge home      Instructions: avoid bladder overdistention                         BM daily                         colace daily with Miralax dose every 3 days x 3 doses                         Increase warm fluids - teas / warm prune juice / soups                         Increase water hydration and natural fiber in diet  Breanna [name]  CNM. MSN, [facility] 08/23/2012, 4:39 PM

## 2012-08-27 DIAGNOSIS — R339 Retention of urine, unspecified: Secondary | ICD-10-CM | POA: Diagnosis not present

## 2012-09-04 DIAGNOSIS — H353190 Nonexudative age-related macular degeneration, unspecified eye, stage unspecified: Secondary | ICD-10-CM | POA: Diagnosis not present

## 2012-09-04 DIAGNOSIS — H26499 Other secondary cataract, unspecified eye: Secondary | ICD-10-CM | POA: Diagnosis not present

## 2012-09-30 DIAGNOSIS — Z09 Encounter for follow-up examination after completed treatment for conditions other than malignant neoplasm: Secondary | ICD-10-CM | POA: Diagnosis not present

## 2012-09-30 DIAGNOSIS — M810 Age-related osteoporosis without current pathological fracture: Secondary | ICD-10-CM | POA: Diagnosis not present

## 2012-10-02 ENCOUNTER — Other Ambulatory Visit: Payer: Self-pay | Admitting: Obstetrics and Gynecology

## 2012-10-09 DIAGNOSIS — R920 Mammographic microcalcification found on diagnostic imaging of breast: Secondary | ICD-10-CM | POA: Diagnosis not present

## 2012-10-22 DIAGNOSIS — K5900 Constipation, unspecified: Secondary | ICD-10-CM | POA: Diagnosis not present

## 2012-10-28 DIAGNOSIS — N8111 Cystocele, midline: Secondary | ICD-10-CM | POA: Diagnosis not present

## 2012-10-28 DIAGNOSIS — R339 Retention of urine, unspecified: Secondary | ICD-10-CM | POA: Diagnosis not present

## 2012-10-31 ENCOUNTER — Other Ambulatory Visit (HOSPITAL_COMMUNITY): Payer: Self-pay | Admitting: Obstetrics and Gynecology

## 2012-10-31 ENCOUNTER — Ambulatory Visit (HOSPITAL_COMMUNITY)
Admission: RE | Admit: 2012-10-31 | Discharge: 2012-10-31 | Disposition: A | Discharge status: Home / Self Care | Payer: Medicare Other | Source: Ambulatory Visit | Attending: Obstetrics and Gynecology | Admitting: Obstetrics and Gynecology

## 2012-10-31 DIAGNOSIS — M810 Age-related osteoporosis without current pathological fracture: Secondary | ICD-10-CM | POA: Insufficient documentation

## 2012-10-31 MED ORDER — SODIUM CHLORIDE 0.9 % IV SOLN
Freq: Once | INTRAVENOUS | Status: AC
  Administered 2012-10-31: 12:00:00 via INTRAVENOUS

## 2012-10-31 MED ORDER — ZOLEDRONIC ACID 5 MG/100ML IV SOLN
5.0000 mg | Freq: Once | INTRAVENOUS | Status: AC
  Administered 2012-10-31: 5 mg via INTRAVENOUS
  Filled 2012-10-31: qty 100

## 2012-11-12 DIAGNOSIS — M47812 Spondylosis without myelopathy or radiculopathy, cervical region: Secondary | ICD-10-CM | POA: Diagnosis not present

## 2012-11-21 DIAGNOSIS — Q7649 Other congenital malformations of spine, not associated with scoliosis: Secondary | ICD-10-CM | POA: Diagnosis not present

## 2012-11-21 DIAGNOSIS — M25669 Stiffness of unspecified knee, not elsewhere classified: Secondary | ICD-10-CM | POA: Diagnosis not present

## 2012-11-21 DIAGNOSIS — M542 Cervicalgia: Secondary | ICD-10-CM | POA: Diagnosis not present

## 2012-11-25 DIAGNOSIS — M25669 Stiffness of unspecified knee, not elsewhere classified: Secondary | ICD-10-CM | POA: Diagnosis not present

## 2012-11-25 DIAGNOSIS — M542 Cervicalgia: Secondary | ICD-10-CM | POA: Diagnosis not present

## 2012-11-25 DIAGNOSIS — R634 Abnormal weight loss: Secondary | ICD-10-CM | POA: Diagnosis not present

## 2012-11-25 DIAGNOSIS — Q7649 Other congenital malformations of spine, not associated with scoliosis: Secondary | ICD-10-CM | POA: Diagnosis not present

## 2012-11-27 DIAGNOSIS — M25669 Stiffness of unspecified knee, not elsewhere classified: Secondary | ICD-10-CM | POA: Diagnosis not present

## 2012-11-27 DIAGNOSIS — M542 Cervicalgia: Secondary | ICD-10-CM | POA: Diagnosis not present

## 2012-11-27 DIAGNOSIS — Q7649 Other congenital malformations of spine, not associated with scoliosis: Secondary | ICD-10-CM | POA: Diagnosis not present

## 2012-11-28 DIAGNOSIS — R634 Abnormal weight loss: Secondary | ICD-10-CM | POA: Diagnosis not present

## 2012-11-28 DIAGNOSIS — I10 Essential (primary) hypertension: Secondary | ICD-10-CM | POA: Diagnosis not present

## 2012-11-28 DIAGNOSIS — E039 Hypothyroidism, unspecified: Secondary | ICD-10-CM | POA: Diagnosis not present

## 2012-11-28 DIAGNOSIS — Z0000 Encounter for general adult medical examination without abnormal findings: Secondary | ICD-10-CM | POA: Diagnosis not present

## 2012-11-28 DIAGNOSIS — Z1331 Encounter for screening for depression: Secondary | ICD-10-CM | POA: Diagnosis not present

## 2012-12-02 DIAGNOSIS — Q7649 Other congenital malformations of spine, not associated with scoliosis: Secondary | ICD-10-CM | POA: Diagnosis not present

## 2012-12-02 DIAGNOSIS — M542 Cervicalgia: Secondary | ICD-10-CM | POA: Diagnosis not present

## 2012-12-02 DIAGNOSIS — M25669 Stiffness of unspecified knee, not elsewhere classified: Secondary | ICD-10-CM | POA: Diagnosis not present

## 2012-12-04 DIAGNOSIS — Q7649 Other congenital malformations of spine, not associated with scoliosis: Secondary | ICD-10-CM | POA: Diagnosis not present

## 2012-12-04 DIAGNOSIS — M25669 Stiffness of unspecified knee, not elsewhere classified: Secondary | ICD-10-CM | POA: Diagnosis not present

## 2012-12-04 DIAGNOSIS — M542 Cervicalgia: Secondary | ICD-10-CM | POA: Diagnosis not present

## 2012-12-08 DIAGNOSIS — H26499 Other secondary cataract, unspecified eye: Secondary | ICD-10-CM | POA: Diagnosis not present

## 2012-12-09 DIAGNOSIS — M25669 Stiffness of unspecified knee, not elsewhere classified: Secondary | ICD-10-CM | POA: Diagnosis not present

## 2012-12-09 DIAGNOSIS — Q7649 Other congenital malformations of spine, not associated with scoliosis: Secondary | ICD-10-CM | POA: Diagnosis not present

## 2012-12-09 DIAGNOSIS — M542 Cervicalgia: Secondary | ICD-10-CM | POA: Diagnosis not present

## 2012-12-11 DIAGNOSIS — M542 Cervicalgia: Secondary | ICD-10-CM | POA: Diagnosis not present

## 2012-12-11 DIAGNOSIS — M25669 Stiffness of unspecified knee, not elsewhere classified: Secondary | ICD-10-CM | POA: Diagnosis not present

## 2012-12-11 DIAGNOSIS — Q7649 Other congenital malformations of spine, not associated with scoliosis: Secondary | ICD-10-CM | POA: Diagnosis not present

## 2012-12-15 DIAGNOSIS — M25669 Stiffness of unspecified knee, not elsewhere classified: Secondary | ICD-10-CM | POA: Diagnosis not present

## 2012-12-15 DIAGNOSIS — M542 Cervicalgia: Secondary | ICD-10-CM | POA: Diagnosis not present

## 2012-12-15 DIAGNOSIS — Q7649 Other congenital malformations of spine, not associated with scoliosis: Secondary | ICD-10-CM | POA: Diagnosis not present

## 2012-12-16 DIAGNOSIS — M47812 Spondylosis without myelopathy or radiculopathy, cervical region: Secondary | ICD-10-CM | POA: Diagnosis not present

## 2012-12-18 DIAGNOSIS — M25669 Stiffness of unspecified knee, not elsewhere classified: Secondary | ICD-10-CM | POA: Diagnosis not present

## 2012-12-18 DIAGNOSIS — Q7649 Other congenital malformations of spine, not associated with scoliosis: Secondary | ICD-10-CM | POA: Diagnosis not present

## 2012-12-18 DIAGNOSIS — M542 Cervicalgia: Secondary | ICD-10-CM | POA: Diagnosis not present

## 2012-12-26 DIAGNOSIS — M542 Cervicalgia: Secondary | ICD-10-CM | POA: Diagnosis not present

## 2012-12-26 DIAGNOSIS — Q7649 Other congenital malformations of spine, not associated with scoliosis: Secondary | ICD-10-CM | POA: Diagnosis not present

## 2012-12-26 DIAGNOSIS — M25669 Stiffness of unspecified knee, not elsewhere classified: Secondary | ICD-10-CM | POA: Diagnosis not present

## 2012-12-29 DIAGNOSIS — H26499 Other secondary cataract, unspecified eye: Secondary | ICD-10-CM | POA: Diagnosis not present

## 2012-12-29 DIAGNOSIS — Z23 Encounter for immunization: Secondary | ICD-10-CM | POA: Diagnosis not present

## 2012-12-30 DIAGNOSIS — Q7649 Other congenital malformations of spine, not associated with scoliosis: Secondary | ICD-10-CM | POA: Diagnosis not present

## 2012-12-30 DIAGNOSIS — M25669 Stiffness of unspecified knee, not elsewhere classified: Secondary | ICD-10-CM | POA: Diagnosis not present

## 2012-12-30 DIAGNOSIS — M542 Cervicalgia: Secondary | ICD-10-CM | POA: Diagnosis not present

## 2013-01-01 DIAGNOSIS — Q7649 Other congenital malformations of spine, not associated with scoliosis: Secondary | ICD-10-CM | POA: Diagnosis not present

## 2013-01-01 DIAGNOSIS — M542 Cervicalgia: Secondary | ICD-10-CM | POA: Diagnosis not present

## 2013-01-01 DIAGNOSIS — M25669 Stiffness of unspecified knee, not elsewhere classified: Secondary | ICD-10-CM | POA: Diagnosis not present

## 2013-01-06 DIAGNOSIS — M542 Cervicalgia: Secondary | ICD-10-CM | POA: Diagnosis not present

## 2013-01-06 DIAGNOSIS — Q7649 Other congenital malformations of spine, not associated with scoliosis: Secondary | ICD-10-CM | POA: Diagnosis not present

## 2013-01-06 DIAGNOSIS — M25669 Stiffness of unspecified knee, not elsewhere classified: Secondary | ICD-10-CM | POA: Diagnosis not present

## 2013-01-08 DIAGNOSIS — M25669 Stiffness of unspecified knee, not elsewhere classified: Secondary | ICD-10-CM | POA: Diagnosis not present

## 2013-01-08 DIAGNOSIS — M542 Cervicalgia: Secondary | ICD-10-CM | POA: Diagnosis not present

## 2013-01-08 DIAGNOSIS — Q7649 Other congenital malformations of spine, not associated with scoliosis: Secondary | ICD-10-CM | POA: Diagnosis not present

## 2013-01-13 DIAGNOSIS — Q7649 Other congenital malformations of spine, not associated with scoliosis: Secondary | ICD-10-CM | POA: Diagnosis not present

## 2013-01-13 DIAGNOSIS — M542 Cervicalgia: Secondary | ICD-10-CM | POA: Diagnosis not present

## 2013-01-13 DIAGNOSIS — M25669 Stiffness of unspecified knee, not elsewhere classified: Secondary | ICD-10-CM | POA: Diagnosis not present

## 2013-01-15 DIAGNOSIS — Q7649 Other congenital malformations of spine, not associated with scoliosis: Secondary | ICD-10-CM | POA: Diagnosis not present

## 2013-01-15 DIAGNOSIS — M542 Cervicalgia: Secondary | ICD-10-CM | POA: Diagnosis not present

## 2013-01-15 DIAGNOSIS — M25669 Stiffness of unspecified knee, not elsewhere classified: Secondary | ICD-10-CM | POA: Diagnosis not present

## 2013-01-29 DIAGNOSIS — H26499 Other secondary cataract, unspecified eye: Secondary | ICD-10-CM | POA: Diagnosis not present

## 2013-02-16 DIAGNOSIS — N8111 Cystocele, midline: Secondary | ICD-10-CM | POA: Diagnosis not present

## 2013-02-16 DIAGNOSIS — N816 Rectocele: Secondary | ICD-10-CM | POA: Diagnosis not present

## 2013-02-18 DIAGNOSIS — H903 Sensorineural hearing loss, bilateral: Secondary | ICD-10-CM | POA: Diagnosis not present

## 2013-03-12 DIAGNOSIS — N816 Rectocele: Secondary | ICD-10-CM | POA: Diagnosis not present

## 2013-04-01 DIAGNOSIS — I10 Essential (primary) hypertension: Secondary | ICD-10-CM | POA: Diagnosis not present

## 2013-04-07 ENCOUNTER — Encounter: Payer: Self-pay | Admitting: Gynecology

## 2013-04-07 ENCOUNTER — Ambulatory Visit (INDEPENDENT_AMBULATORY_CARE_PROVIDER_SITE_OTHER): Payer: Medicare Other | Admitting: Gynecology

## 2013-04-07 VITALS — BP 120/76 | Ht 61.0 in | Wt 121.0 lb

## 2013-04-07 DIAGNOSIS — N816 Rectocele: Secondary | ICD-10-CM

## 2013-04-07 DIAGNOSIS — N952 Postmenopausal atrophic vaginitis: Secondary | ICD-10-CM

## 2013-04-07 DIAGNOSIS — M810 Age-related osteoporosis without current pathological fracture: Secondary | ICD-10-CM

## 2013-04-07 NOTE — Progress Notes (Signed)
Breanna [name] [birth year] [id number]        80 y.o.  G4P4 new patient with complex history. She is here to establish ongoing care but recently had a TVH/BSO 07/2012 by Dr. Garwin Brothers. She also had a cystocele repair and vaginal prolapse graft repair by Dr. Matilde Sprang. Did well initially but subsequent developed pelvic pressure and stool trapping and was found to have a rectocele. She was referred to [facility] for evaluation and has an appointment in February. Otherwise is doing well with several issues noted below.  Past medical history,surgical history, problem list, medications, allergies, family history and social history were all reviewed and documented in the EPIC chart.  ROS:  Performed and pertinent positives and negatives are included in the history, assessment and plan .  Exam: Kim assistant Filed Vitals:   04/07/13 1534  BP: 120/76  Height: 5\' 1"  (1.549 m)  Weight: 121 lb (54.885 kg)   General appearance  Normal Skin grossly normal Head/Neck normal with no cervical or supraclavicular adenopathy thyroid normal Lungs  clear Cardiac RR, without RMG Abdominal  soft, nontender, without masses, organomegaly or hernia Breasts  examined lying and sitting without masses, retractions, discharge or axillary adenopathy. Left nipple dimpled as always. Pelvic  Ext/BUS/vagina  generalized atrophic changes. Anterior vaginal wall well supported. Vaginal cuff well supported. Large rectocele to the level of the introitus.  Adnexa  Without masses or tenderness    Anus and perineum  Normal   Rectovaginal  Normal sphincter tone without palpated masses or tenderness. Large rectocele.  Procedure: Multiple sizes and types of pessaries were attempted to be placed to include Gellhorn, ring and cube pessaries.  The rectocele would prolapse below all of the attempted pessaries. Due to the prior surgery they would not hold in place properly.   Assessment/Plan:  80 y.o. G13P4 female for annual exam.    1. Postmenopausal/atrophic genital changes. Patient without significant symptoms to include hot flashes, night sweats, vaginal dryness. Patient is not sexually active. Will continue to monitor. 2. Large rectocele apparently developed postoperatively after TVH/BSO/anterior colporrhaphy/vaginal vault graft suspension. Had attempted pessary placement by Dr. cousins. I also tried pessaries she was unable to hold ring, Gellhorn or cube pessaries. She does have an appointment with [facility] for evaluation set up by Dr. Garwin Brothers. I've encouraged her to keep this appointment as I think if she does consider a surgical repair that mesh will probably be needed given the size and probable poor tissue quality. I also reviewed with the patient this is a quality of life issue and that there is no essential need to repair this at this time as long as she's comfortable with the issues with bowel movements. Patient will followup at [facility] and then go from there. 3. Osteoporosis historically. I have no studies but she apparently had been on Actonel for 10-12 years by her history and switch to Reclast. She states that she has been on this for going on 3 years. Has planned DEXA this coming spring and planned Reclast infusion in July. Will attempt to get records of the bone studies as I reviewed with her there may be a possibility for drug-free holiday. This apparently has been coordinated both by Dr. Maxwell Caul and Dr. Garwin Brothers. Increased vitamin D/calcium reviewed. 4. Pap smear 2011 normal. Given her age and hysterectomy history will stop screening at this point and she agrees with this. She denies any history of significant atypia or treatment in the past for cervical dysplasia. 5. Colonoscopy several years ago. Will  repeat at their recommended interval. 6. Mammography 04/2012. Recommend repeat this month and she agrees to followup for this. SBE monthly reviewed. 7. Health maintenance. No routine blood work done as this is done  through her primary physician's office. Followup 1 year sooner as needed.   Note: This document was prepared with digital dictation and possible smart phrase technology. Any transcriptional errors that result from this process are unintentional.   Anastasio Auerbach MD, 5:26 PM 04/07/2013

## 2013-04-07 NOTE — Patient Instructions (Signed)
Followup with appointment at [place] with me in one year.

## 2013-04-14 ENCOUNTER — Encounter: Payer: Self-pay | Admitting: Gynecology

## 2013-05-14 DIAGNOSIS — R609 Edema, unspecified: Secondary | ICD-10-CM | POA: Diagnosis not present

## 2013-05-20 DIAGNOSIS — K469 Unspecified abdominal hernia without obstruction or gangrene: Secondary | ICD-10-CM | POA: Diagnosis not present

## 2013-05-20 DIAGNOSIS — N816 Rectocele: Secondary | ICD-10-CM | POA: Diagnosis not present

## 2013-05-21 ENCOUNTER — Other Ambulatory Visit: Payer: Self-pay

## 2013-05-21 DIAGNOSIS — L659 Nonscarring hair loss, unspecified: Secondary | ICD-10-CM | POA: Diagnosis not present

## 2013-05-21 DIAGNOSIS — D485 Neoplasm of uncertain behavior of skin: Secondary | ICD-10-CM | POA: Diagnosis not present

## 2013-05-21 DIAGNOSIS — L570 Actinic keratosis: Secondary | ICD-10-CM | POA: Diagnosis not present

## 2013-07-07 DIAGNOSIS — I10 Essential (primary) hypertension: Secondary | ICD-10-CM | POA: Diagnosis not present

## 2013-07-17 ENCOUNTER — Other Ambulatory Visit: Payer: Self-pay | Admitting: *Deleted

## 2013-07-17 DIAGNOSIS — M810 Age-related osteoporosis without current pathological fracture: Secondary | ICD-10-CM

## 2013-07-20 DIAGNOSIS — Z01812 Encounter for preprocedural laboratory examination: Secondary | ICD-10-CM | POA: Diagnosis not present

## 2013-07-20 DIAGNOSIS — Z79899 Other long term (current) drug therapy: Secondary | ICD-10-CM | POA: Diagnosis not present

## 2013-07-20 DIAGNOSIS — Z5181 Encounter for therapeutic drug level monitoring: Secondary | ICD-10-CM | POA: Diagnosis not present

## 2013-08-01 ENCOUNTER — Encounter: Payer: Self-pay | Admitting: *Deleted

## 2013-08-04 DIAGNOSIS — Z1231 Encounter for screening mammogram for malignant neoplasm of breast: Secondary | ICD-10-CM | POA: Diagnosis not present

## 2013-08-05 ENCOUNTER — Encounter: Payer: Self-pay | Admitting: Gynecology

## 2013-08-06 DIAGNOSIS — H353190 Nonexudative age-related macular degeneration, unspecified eye, stage unspecified: Secondary | ICD-10-CM | POA: Diagnosis not present

## 2013-08-31 DIAGNOSIS — M810 Age-related osteoporosis without current pathological fracture: Secondary | ICD-10-CM

## 2013-08-31 HISTORY — DX: Age-related osteoporosis without current pathological fracture: M81.0

## 2013-09-22 ENCOUNTER — Encounter: Payer: Self-pay | Admitting: Gynecology

## 2013-09-22 ENCOUNTER — Ambulatory Visit (INDEPENDENT_AMBULATORY_CARE_PROVIDER_SITE_OTHER): Payer: Medicare Other

## 2013-09-22 DIAGNOSIS — M810 Age-related osteoporosis without current pathological fracture: Secondary | ICD-10-CM | POA: Diagnosis not present

## 2013-11-09 DIAGNOSIS — N816 Rectocele: Secondary | ICD-10-CM | POA: Diagnosis not present

## 2013-11-09 DIAGNOSIS — K469 Unspecified abdominal hernia without obstruction or gangrene: Secondary | ICD-10-CM | POA: Diagnosis not present

## 2013-11-09 DIAGNOSIS — R339 Retention of urine, unspecified: Secondary | ICD-10-CM | POA: Diagnosis not present

## 2013-11-24 DIAGNOSIS — L821 Other seborrheic keratosis: Secondary | ICD-10-CM | POA: Diagnosis not present

## 2013-11-24 DIAGNOSIS — L299 Pruritus, unspecified: Secondary | ICD-10-CM | POA: Diagnosis not present

## 2013-11-24 DIAGNOSIS — L570 Actinic keratosis: Secondary | ICD-10-CM | POA: Diagnosis not present

## 2013-11-24 DIAGNOSIS — L659 Nonscarring hair loss, unspecified: Secondary | ICD-10-CM | POA: Diagnosis not present

## 2013-11-30 DIAGNOSIS — E039 Hypothyroidism, unspecified: Secondary | ICD-10-CM | POA: Diagnosis not present

## 2013-11-30 DIAGNOSIS — Z0000 Encounter for general adult medical examination without abnormal findings: Secondary | ICD-10-CM | POA: Diagnosis not present

## 2013-11-30 DIAGNOSIS — I10 Essential (primary) hypertension: Secondary | ICD-10-CM | POA: Diagnosis not present

## 2013-11-30 DIAGNOSIS — Z1331 Encounter for screening for depression: Secondary | ICD-10-CM | POA: Diagnosis not present

## 2013-12-23 ENCOUNTER — Telehealth: Payer: Self-pay | Admitting: *Deleted

## 2013-12-23 NOTE — Telephone Encounter (Signed)
Pt called to follow up to see if she should continue to proceed with reclast based on recent dexa results? relcast (was due in July, pt never had this done due to her sick husband) Or take a drug free holiday or start on another medication? Please advise

## 2013-12-25 DIAGNOSIS — M2020 Hallux rigidus, unspecified foot: Secondary | ICD-10-CM | POA: Diagnosis not present

## 2013-12-25 DIAGNOSIS — IMO0002 Reserved for concepts with insufficient information to code with codable children: Secondary | ICD-10-CM | POA: Diagnosis not present

## 2013-12-25 DIAGNOSIS — M24673 Ankylosis, unspecified ankle: Secondary | ICD-10-CM | POA: Diagnosis not present

## 2013-12-25 NOTE — Telephone Encounter (Signed)
Tell patient it appears that her bone density over all is stable. She has some areas with loss but some areas with gain. I think given the length of time she has been on the medications between the Actonel and the Reclast that I would recommend holding on the Reclast for now and then we will repeat the bone density in 2 years.

## 2013-12-25 NOTE — Telephone Encounter (Signed)
Pt informed with the below note. 

## 2013-12-25 NOTE — Telephone Encounter (Signed)
Left message for pt to call.

## 2013-12-30 DIAGNOSIS — M7750 Other enthesopathy of unspecified foot: Secondary | ICD-10-CM | POA: Diagnosis not present

## 2013-12-30 DIAGNOSIS — S92309A Fracture of unspecified metatarsal bone(s), unspecified foot, initial encounter for closed fracture: Secondary | ICD-10-CM | POA: Diagnosis not present

## 2013-12-30 DIAGNOSIS — M7989 Other specified soft tissue disorders: Secondary | ICD-10-CM | POA: Diagnosis not present

## 2013-12-30 DIAGNOSIS — M2020 Hallux rigidus, unspecified foot: Secondary | ICD-10-CM | POA: Diagnosis not present

## 2014-01-05 DIAGNOSIS — Z23 Encounter for immunization: Secondary | ICD-10-CM | POA: Diagnosis not present

## 2014-01-20 DIAGNOSIS — S92231D Displaced fracture of intermediate cuneiform of right foot, subsequent encounter for fracture with routine healing: Secondary | ICD-10-CM | POA: Diagnosis not present

## 2014-01-22 DIAGNOSIS — H903 Sensorineural hearing loss, bilateral: Secondary | ICD-10-CM | POA: Diagnosis not present

## 2014-01-28 ENCOUNTER — Ambulatory Visit: Payer: Medicare Other | Admitting: Gynecology

## 2014-02-01 ENCOUNTER — Ambulatory Visit (INDEPENDENT_AMBULATORY_CARE_PROVIDER_SITE_OTHER): Payer: Medicare Other | Admitting: Gynecology

## 2014-02-01 ENCOUNTER — Encounter: Payer: Self-pay | Admitting: Gynecology

## 2014-02-01 DIAGNOSIS — N816 Rectocele: Secondary | ICD-10-CM | POA: Diagnosis not present

## 2014-02-01 NOTE — Patient Instructions (Signed)
Follow up with doctor at [facility] as arranged

## 2014-02-01 NOTE — Progress Notes (Signed)
Breanna [name] Aug 11, 1930 [id number]        80 y.o.  G4P4 Presents in follow up of her rectocele.  Status post TVH BSO by Dr. Garwin [name] with cystocele and vaginal prolapse graft repair by Dr. Matilde [name] 07/2012. Subsequently developed a rectocele which seems to be getting worse. Saw Dr. Carlota [name] at who discussed with her with sounds to be a LeFort procedure. She wanted my opinion in reference to this. We had attempted pessary but she was unable to maintain a variety of different pessaries within the vagina.  Past medical history,surgical history, problem list, medications, allergies, family history and social history were all reviewed and documented in the EPIC chart.  Directed ROS with pertinent positives and negatives documented in the history of present illness/assessment and plan.  Exam: Tim assistant General appearance:  Normal Pelvic:  Atrophic genital changes.  Third-degree rectocele. Anterior vagina/vaginal cuff appears to be supported. Bimanual without masses or tenderness. Rectovaginal exam confirms.  Assessment/Plan:  80 y.o. G4P4 with third-degree rectocele. Proposed LeFort procedure. I reviewed with involved with this to include that she would no longer be able to insert anything into her vagina. Second opinion options to include referral to [facility] also discussed. Given her above surgery I would not feel comfortable trying to do a rectocele repair myself and given the degree of her rectocele she would probably require mesh. Patient with like a second opinion referral at [facility] and will help her make these arrangements.     Breanna Auerbach MD, 2:07 PM 02/01/2014

## 2014-02-08 ENCOUNTER — Telehealth: Payer: Self-pay | Admitting: *Deleted

## 2014-02-08 NOTE — Telephone Encounter (Signed)
-----   Message from Anastasio Auerbach, MD sent at 02/01/2014  2:15 PM EST ----- Patient would like second opinion consult with gynecologist at [facility] reference history of TVH, BSO, cystocele and vaginal vault prolapse repair (operative reports available in Epic) now with large rectocele. First consult opinion recommended colpocleisis. Patient would like second opinion.

## 2014-02-08 NOTE — Telephone Encounter (Signed)
Appointment on 03/03/14 @ 3:00 pm with Dr.Anthony Visco, pt informed will be mailed a packet with direction to her. Notes will be faxed to [phone] and phone # [phone]

## 2014-02-11 DIAGNOSIS — M84374S Stress fracture, right foot, sequela: Secondary | ICD-10-CM | POA: Diagnosis not present

## 2014-03-21 IMAGING — CR DG ABDOMEN 1V
1 series · 1 of 1 positions shown · non-contrast
Comparison: 08/21/2012

CLINICAL DATA: Ileus

ABDOMEN - 1 VIEW

[view not recorded]
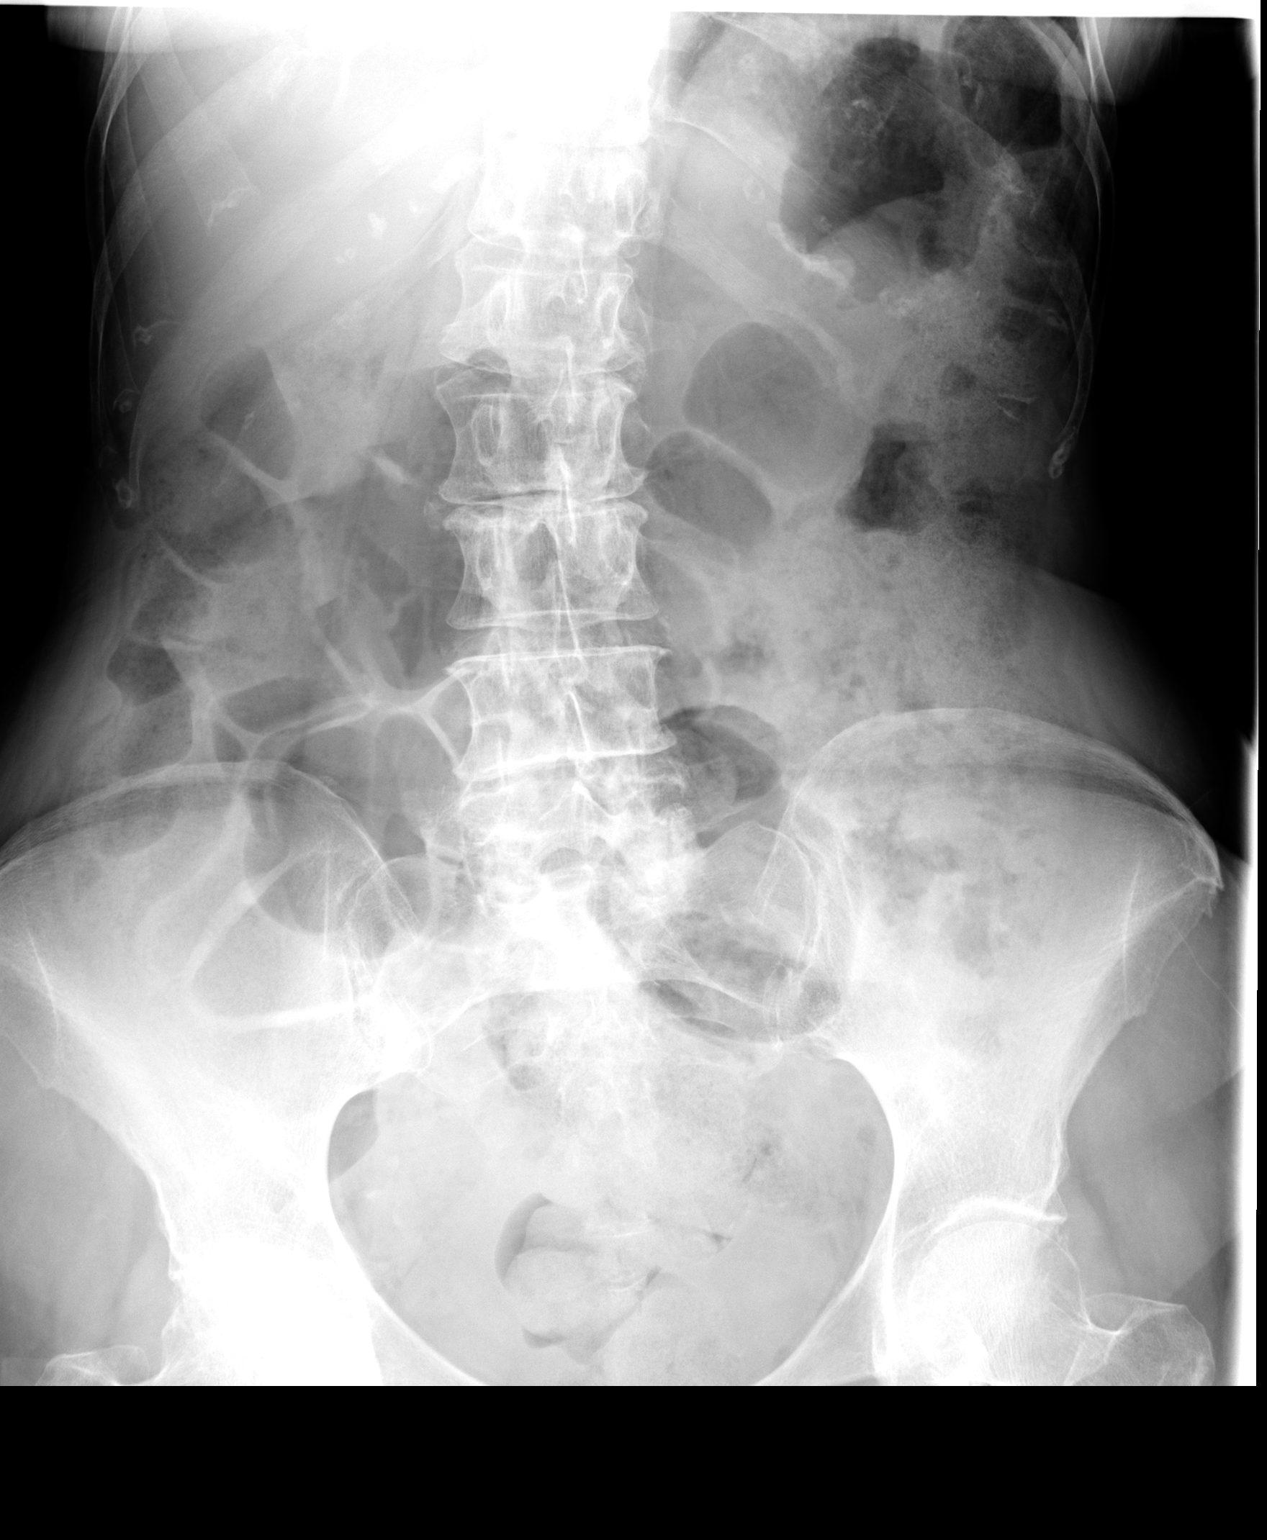

[1 of 1 positions shown; findings below may reference images not displayed]

FINDINGS: Prominent stool burden throughout the descending and
sigmoid colon.  Mild distention of colon across the abdomen.  No
obvious free intraperitoneal gas.  No disproportionate dilatation
of small bowel.
IMPRESSION: Mild distention of the colon with prominent stool burden.

## 2014-03-31 DIAGNOSIS — N993 Prolapse of vaginal vault after hysterectomy: Secondary | ICD-10-CM | POA: Diagnosis not present

## 2014-04-02 HISTORY — PX: ROBOTIC ASSISTED LAPAROSCOPIC SACROCOLPOPEXY: SHX5388

## 2014-04-12 ENCOUNTER — Encounter: Payer: Medicare Other | Admitting: Gynecology

## 2014-04-22 DIAGNOSIS — N8110 Cystocele, unspecified: Secondary | ICD-10-CM | POA: Diagnosis not present

## 2014-04-22 DIAGNOSIS — N819 Female genital prolapse, unspecified: Secondary | ICD-10-CM | POA: Diagnosis not present

## 2014-04-22 DIAGNOSIS — N816 Rectocele: Secondary | ICD-10-CM | POA: Diagnosis not present

## 2014-05-18 DIAGNOSIS — Z79899 Other long term (current) drug therapy: Secondary | ICD-10-CM | POA: Diagnosis not present

## 2014-05-18 DIAGNOSIS — Z01818 Encounter for other preprocedural examination: Secondary | ICD-10-CM | POA: Diagnosis not present

## 2014-05-18 DIAGNOSIS — N819 Female genital prolapse, unspecified: Secondary | ICD-10-CM | POA: Diagnosis not present

## 2014-05-20 ENCOUNTER — Ambulatory Visit (INDEPENDENT_AMBULATORY_CARE_PROVIDER_SITE_OTHER): Payer: Medicare Other | Admitting: Gynecology

## 2014-05-20 ENCOUNTER — Encounter: Payer: Self-pay | Admitting: Gynecology

## 2014-05-20 VITALS — BP 130/70 | Ht 60.0 in | Wt 121.0 lb

## 2014-05-20 DIAGNOSIS — M810 Age-related osteoporosis without current pathological fracture: Secondary | ICD-10-CM | POA: Diagnosis not present

## 2014-05-20 DIAGNOSIS — N816 Rectocele: Secondary | ICD-10-CM | POA: Diagnosis not present

## 2014-05-20 DIAGNOSIS — Z01419 Encounter for gynecological examination (general) (routine) without abnormal findings: Secondary | ICD-10-CM | POA: Diagnosis not present

## 2014-05-20 DIAGNOSIS — N368 Other specified disorders of urethra: Secondary | ICD-10-CM | POA: Diagnosis not present

## 2014-05-20 DIAGNOSIS — N952 Postmenopausal atrophic vaginitis: Secondary | ICD-10-CM | POA: Diagnosis not present

## 2014-05-20 NOTE — Patient Instructions (Signed)
You may obtain a copy of any labs that were done today by logging onto MyChart as outlined in the instructions provided with your AVS (after visit summary). The office will not call with normal lab results but certainly if there are any significant abnormalities then we will contact you.   Health Maintenance, Female A healthy lifestyle and preventative care can promote health and wellness.  Maintain regular health, dental, and eye exams.  Eat a healthy diet. Foods like vegetables, fruits, whole grains, low-fat dairy products, and lean protein foods contain the nutrients you need without too many calories. Decrease your intake of foods high in solid fats, added sugars, and salt. Get information about a proper diet from your caregiver, if necessary.  Regular physical exercise is one of the most important things you can do for your health. Most adults should get at least 150 minutes of moderate-intensity exercise (any activity that increases your heart rate and causes you to sweat) each week. In addition, most adults need muscle-strengthening exercises on 2 or more days a week.   Maintain a healthy weight. The body mass index (BMI) is a screening tool to identify possible weight problems. It provides an estimate of body fat based on height and weight. Your caregiver can help determine your BMI, and can help you achieve or maintain a healthy weight. For adults 20 years and older:  A BMI below 18.5 is considered underweight.  A BMI of 18.5 to 24.9 is normal.  A BMI of 25 to 29.9 is considered overweight.  A BMI of 30 and above is considered obese.  Maintain normal blood lipids and cholesterol by exercising and minimizing your intake of saturated fat. Eat a balanced diet with plenty of fruits and vegetables. Blood tests for lipids and cholesterol should begin at age 61 and be repeated every 5 years. If your lipid or cholesterol levels are high, you are over 50, or you are a high risk for heart  disease, you may need your cholesterol levels checked more frequently.Ongoing high lipid and cholesterol levels should be treated with medicines if diet and exercise are not effective.  If you smoke, find out from your caregiver how to quit. If you do not use tobacco, do not start.  Lung cancer screening is recommended for adults aged 33 80 years who are at high risk for developing lung cancer because of a history of smoking. Yearly low-dose computed tomography (CT) is recommended for people who have at least a 30-pack-year history of smoking and are a current smoker or have quit within the past 15 years. A pack year of smoking is smoking an average of 1 pack of cigarettes a day for 1 year (for example: 1 pack a day for 30 years or 2 packs a day for 15 years). Yearly screening should continue until the smoker has stopped smoking for at least 15 years. Yearly screening should also be stopped for people who develop a health problem that would prevent them from having lung cancer treatment.  If you are pregnant, do not drink alcohol. If you are breastfeeding, be very cautious about drinking alcohol. If you are not pregnant and choose to drink alcohol, do not exceed 1 drink per day. One drink is considered to be 12 ounces (355 mL) of beer, 5 ounces (148 mL) of wine, or 1.5 ounces (44 mL) of liquor.  Avoid use of street drugs. Do not share needles with anyone. Ask for help if you need support or instructions about stopping  the use of drugs.  High blood pressure causes heart disease and increases the risk of stroke. Blood pressure should be checked at least every 1 to 2 years. Ongoing high blood pressure should be treated with medicines, if weight loss and exercise are not effective.  If you are 59 to 79 years old, ask your caregiver if you should take aspirin to prevent strokes.  Diabetes screening involves taking a blood sample to check your fasting blood sugar level. This should be done once every 3  years, after age 91, if you are within normal weight and without risk factors for diabetes. Testing should be considered at a younger age or be carried out more frequently if you are overweight and have at least 1 risk factor for diabetes.  Breast cancer screening is essential preventative care for women. You should practice "breast self-awareness." This means understanding the normal appearance and feel of your breasts and may include breast self-examination. Any changes detected, no matter how small, should be reported to a caregiver. Women in their 66s and 30s should have a clinical breast exam (CBE) by a caregiver as part of a regular health exam every 1 to 3 years. After age 101, women should have a CBE every year. Starting at age 100, women should consider having a mammogram (breast X-ray) every year. Women who have a family history of breast cancer should talk to their caregiver about genetic screening. Women at a high risk of breast cancer should talk to their caregiver about having an MRI and a mammogram every year.  Breast cancer gene (BRCA)-related cancer risk assessment is recommended for women who have family members with BRCA-related cancers. BRCA-related cancers include breast, ovarian, tubal, and peritoneal cancers. Having family members with these cancers may be associated with an increased risk for harmful changes (mutations) in the breast cancer genes BRCA1 and BRCA2. Results of the assessment will determine the need for genetic counseling and BRCA1 and BRCA2 testing.  The Pap test is a screening test for cervical cancer. Women should have a Pap test starting at age 57. Between ages 25 and 35, Pap tests should be repeated every 2 years. Beginning at age 37, you should have a Pap test every 3 years as long as the past 3 Pap tests have been normal. If you had a hysterectomy for a problem that was not cancer or a condition that could lead to cancer, then you no longer need Pap tests. If you are  between ages 50 and 76, and you have had normal Pap tests going back 10 years, you no longer need Pap tests. If you have had past treatment for cervical cancer or a condition that could lead to cancer, you need Pap tests and screening for cancer for at least 20 years after your treatment. If Pap tests have been discontinued, risk factors (such as a new sexual partner) need to be reassessed to determine if screening should be resumed. Some women have medical problems that increase the chance of getting cervical cancer. In these cases, your caregiver may recommend more frequent screening and Pap tests.  The human papillomavirus (HPV) test is an additional test that may be used for cervical cancer screening. The HPV test looks for the virus that can cause the cell changes on the cervix. The cells collected during the Pap test can be tested for HPV. The HPV test could be used to screen women aged 44 years and older, and should be used in women of any age  who have unclear Pap test results. After the age of 55, women should have HPV testing at the same frequency as a Pap test.  Colorectal cancer can be detected and often prevented. Most routine colorectal cancer screening begins at the age of 44 and continues through age 20. However, your caregiver may recommend screening at an earlier age if you have risk factors for colon cancer. On a yearly basis, your caregiver may provide home test kits to check for hidden blood in the stool. Use of a small camera at the end of a tube, to directly examine the colon (sigmoidoscopy or colonoscopy), can detect the earliest forms of colorectal cancer. Talk to your caregiver about this at age 86, when routine screening begins. Direct examination of the colon should be repeated every 5 to 10 years through age 13, unless early forms of pre-cancerous polyps or small growths are found.  Hepatitis C blood testing is recommended for all people born from [birth year] through 1963 and any  individual with known risks for hepatitis C.  Practice safe sex. Use condoms and avoid high-risk sexual practices to reduce the spread of sexually transmitted infections (STIs). Sexually active women aged 36 and younger should be checked for Chlamydia, which is a common sexually transmitted infection. Older women with new or multiple partners should also be tested for Chlamydia. Testing for other STIs is recommended if you are sexually active and at increased risk.  Osteoporosis is a disease in which the bones lose minerals and strength with aging. This can result in serious bone fractures. The risk of osteoporosis can be identified using a bone density scan. Women ages 20 and over and women at risk for fractures or osteoporosis should discuss screening with their caregivers. Ask your caregiver whether you should be taking a calcium supplement or vitamin D to reduce the rate of osteoporosis.  Menopause can be associated with physical symptoms and risks. Hormone replacement therapy is available to decrease symptoms and risks. You should talk to your caregiver about whether hormone replacement therapy is right for you.  Use sunscreen. Apply sunscreen liberally and repeatedly throughout the day. You should seek shade when your shadow is shorter than you. Protect yourself by wearing long sleeves, pants, a wide-brimmed hat, and sunglasses year round, whenever you are outdoors.  Notify your caregiver of new moles or changes in moles, especially if there is a change in shape or color. Also notify your caregiver if a mole is larger than the size of a pencil eraser.  Stay current with your immunizations. Document Released: 10/02/2010 Document Revised: 07/14/2012 Document Reviewed: 10/02/2010 [facility] Patient Information 2014 [employer].

## 2014-05-20 NOTE — Progress Notes (Signed)
Breanna [name] Nov 24, 1930 [id number]        81 y.o.  G4P4 for breast and pelvic exam. Several issues noted below.  Past medical history,surgical history, problem list, medications, allergies, family history and social history were all reviewed and documented as reviewed in the EPIC chart.  ROS:  Performed with pertinent positives and negatives included in the history, assessment and plan.   Additional significant findings :  none   Exam: Breanna [name] Vitals:   05/20/14 0935  BP: 130/70  Height: 5' (1.524 m)  Weight: 121 lb (54.885 kg)   General appearance:  Normal affect, orientation and appearance. Skin: Grossly normal HEENT: Without gross lesions.  No cervical or supraclavicular adenopathy. Thyroid normal.  Lungs:  Clear without wheezing, rales or rhonchi Cardiac: RR, without RMG Abdominal:  Soft, nontender, without masses, guarding, rebound, organomegaly or hernia Breasts:  Examined lying and sitting without masses, retractions, discharge or axillary adenopathy.  Left nipple dimpled as always Pelvic:  Ext/BUS/vagina with generalized atrophic changes. Large rectocele protruding through the introitus noted. Vaginal cuff appears well supported. No significant cystocele. Mild urethral prolapse.  Adnexa  Without masses or tenderness    Anus and perineum  Normal   Rectovaginal  Normal sphincter tone without palpated masses or tenderness.    Assessment/Plan:  81 y.o. G24P4 female for breast and pelvic exam.   1. Large rectocele.  Status post TVH/BSO/anterior colporrhaphy/vaginal vault graft suspension as noted in 04/07/2013 note. Is scheduled at [facility] next week for robotic sacral colpopexy and possible anterior-posterior repair. 2. Postmenopausal/atrophic genital changes. Patient without significant symptoms of hot flushes, night sweats, vaginal dryness. Is not sexually active. We'll continue to monitor. 3. Osteoporosis.  DEXA 08/2013 T score -2.8. Overall stable from prior  DEXA. Had been on Actonel and subsequent Reclast for close to 14 years. Recommend the patient stop last year for drug-free holiday. Will repeat DEXA next year at two-year interval. Increased calcium vitamin D weight-bearing exercise reviewed. 4. Mammography 07/2013. Continue with annual mammography. SBE monthly reviewed. 5. Pap smear 2011. No Pap smear done today. No history of significant abnormal Pap smears. Age 43. Status post hysterectomy for benign indications. We'll plan to stop screening per current screening guidelines and patient is comfortable with this. 6. Colonoscopy 4-5 years ago. Repeat at their recommended interval. 7. Health maintenance. No routine blood work done as she reports this done at her primary physician's office. Follow up in one year, sooner as needed.     Breanna Auerbach MD, 10:03 AM 05/20/2014

## 2014-05-21 LAB — URINALYSIS W MICROSCOPIC + REFLEX CULTURE
BILIRUBIN URINE: NEGATIVE
CRYSTALS: NONE SEEN
Casts: NONE SEEN
Glucose, UA: NEGATIVE mg/dL
Hgb urine dipstick: NEGATIVE
KETONES UR: NEGATIVE mg/dL
Nitrite: NEGATIVE
PH: 7.5 (ref 5.0–8.0)
Protein, ur: NEGATIVE mg/dL
SPECIFIC GRAVITY, URINE: 1.011 (ref 1.005–1.030)
UROBILINOGEN UA: 0.2 mg/dL (ref 0.0–1.0)

## 2014-05-22 LAB — URINE CULTURE

## 2014-05-31 DIAGNOSIS — N819 Female genital prolapse, unspecified: Secondary | ICD-10-CM | POA: Diagnosis not present

## 2014-05-31 DIAGNOSIS — E039 Hypothyroidism, unspecified: Secondary | ICD-10-CM | POA: Diagnosis not present

## 2014-05-31 DIAGNOSIS — Z79899 Other long term (current) drug therapy: Secondary | ICD-10-CM | POA: Diagnosis not present

## 2014-05-31 DIAGNOSIS — N8111 Cystocele, midline: Secondary | ICD-10-CM | POA: Diagnosis not present

## 2014-05-31 DIAGNOSIS — N993 Prolapse of vaginal vault after hysterectomy: Secondary | ICD-10-CM | POA: Diagnosis not present

## 2014-05-31 DIAGNOSIS — Z90710 Acquired absence of both cervix and uterus: Secondary | ICD-10-CM | POA: Diagnosis not present

## 2014-05-31 DIAGNOSIS — N8110 Cystocele, unspecified: Secondary | ICD-10-CM | POA: Diagnosis not present

## 2014-05-31 DIAGNOSIS — N816 Rectocele: Secondary | ICD-10-CM | POA: Diagnosis not present

## 2014-05-31 DIAGNOSIS — I10 Essential (primary) hypertension: Secondary | ICD-10-CM | POA: Diagnosis not present

## 2014-05-31 DIAGNOSIS — Z90722 Acquired absence of ovaries, bilateral: Secondary | ICD-10-CM | POA: Diagnosis not present

## 2014-06-02 ENCOUNTER — Encounter (HOSPITAL_COMMUNITY): Payer: Self-pay | Admitting: *Deleted

## 2014-06-02 ENCOUNTER — Emergency Department (HOSPITAL_COMMUNITY): Payer: Medicare Other

## 2014-06-02 ENCOUNTER — Emergency Department (HOSPITAL_COMMUNITY)
Admission: EM | Admit: 2014-06-02 | Discharge: 2014-06-03 | Disposition: A | Discharge status: Home / Self Care | Payer: Medicare Other | Attending: Emergency Medicine | Admitting: Emergency Medicine

## 2014-06-02 DIAGNOSIS — R109 Unspecified abdominal pain: Secondary | ICD-10-CM

## 2014-06-02 DIAGNOSIS — Z7982 Long term (current) use of aspirin: Secondary | ICD-10-CM | POA: Insufficient documentation

## 2014-06-02 DIAGNOSIS — Z9889 Other specified postprocedural states: Secondary | ICD-10-CM | POA: Diagnosis not present

## 2014-06-02 DIAGNOSIS — R600 Localized edema: Secondary | ICD-10-CM | POA: Diagnosis not present

## 2014-06-02 DIAGNOSIS — E039 Hypothyroidism, unspecified: Secondary | ICD-10-CM | POA: Diagnosis not present

## 2014-06-02 DIAGNOSIS — Z8673 Personal history of transient ischemic attack (TIA), and cerebral infarction without residual deficits: Secondary | ICD-10-CM | POA: Diagnosis not present

## 2014-06-02 DIAGNOSIS — R1030 Lower abdominal pain, unspecified: Secondary | ICD-10-CM | POA: Diagnosis not present

## 2014-06-02 DIAGNOSIS — E871 Hypo-osmolality and hyponatremia: Secondary | ICD-10-CM | POA: Diagnosis not present

## 2014-06-02 DIAGNOSIS — M810 Age-related osteoporosis without current pathological fracture: Secondary | ICD-10-CM | POA: Insufficient documentation

## 2014-06-02 DIAGNOSIS — K668 Other specified disorders of peritoneum: Secondary | ICD-10-CM | POA: Diagnosis not present

## 2014-06-02 DIAGNOSIS — Z79899 Other long term (current) drug therapy: Secondary | ICD-10-CM | POA: Diagnosis not present

## 2014-06-02 DIAGNOSIS — R338 Other retention of urine: Secondary | ICD-10-CM

## 2014-06-02 DIAGNOSIS — R339 Retention of urine, unspecified: Secondary | ICD-10-CM | POA: Diagnosis not present

## 2014-06-02 DIAGNOSIS — G8918 Other acute postprocedural pain: Secondary | ICD-10-CM

## 2014-06-02 DIAGNOSIS — N3289 Other specified disorders of bladder: Secondary | ICD-10-CM | POA: Diagnosis not present

## 2014-06-02 LAB — I-STAT CG4 LACTIC ACID, ED: Lactic Acid, Venous: 0.67 mmol/L (ref 0.5–2.0)

## 2014-06-02 MED ORDER — SODIUM CHLORIDE 0.9 % IV SOLN
1000.0000 mL | Freq: Once | INTRAVENOUS | Status: AC
  Administered 2014-06-02: 1000 mL via INTRAVENOUS

## 2014-06-02 MED ORDER — MORPHINE SULFATE 4 MG/ML IJ SOLN
4.0000 mg | Freq: Once | INTRAMUSCULAR | Status: AC
  Administered 2014-06-02: 4 mg via INTRAVENOUS
  Filled 2014-06-02: qty 1

## 2014-06-02 MED ORDER — ONDANSETRON HCL 4 MG/2ML IJ SOLN
4.0000 mg | Freq: Once | INTRAMUSCULAR | Status: AC
  Administered 2014-06-02: 4 mg via INTRAVENOUS
  Filled 2014-06-02: qty 2

## 2014-06-02 MED ORDER — IOHEXOL 300 MG/ML  SOLN
50.0000 mL | Freq: Once | INTRAMUSCULAR | Status: AC | PRN
  Administered 2014-06-02: 50 mL via ORAL

## 2014-06-02 MED ORDER — SODIUM CHLORIDE 0.9 % IV SOLN
1000.0000 mL | INTRAVENOUS | Status: DC
  Administered 2014-06-03: 1000 mL via INTRAVENOUS

## 2014-06-02 NOTE — ED Provider Notes (Addendum)
CSN: [id number]     Arrival date & time 06/02/14  2226 History   First MD Initiated Contact with Patient 06/02/14 2322     Chief Complaint  Patient presents with  . Post-op Problem     (Consider location/radiation/quality/duration/timing/severity/associated sxs/prior Treatment) The history is provided by the patient.   81 year old female is 2 days postop pelvic reconstructive surgery at [facility]. She has been having lower abdominal pain since then which is gone worse today. Pain is across the mid and lower abdomen with some radiation to the back. She rates pain at 10/10. Nothing makes it better nothing makes it worse. There is associated nausea and vomiting. She denies fever or chills. She denies dysuria. She had a catheter removed in the doctor's office earlier today. She has not had constipation or diarrhea. She's only been able to eat very small amounts since surgery. She is taken acetaminophen and ibuprofen for pain without relief. She did not receive any narcotic prescriptions on discharge. She had talked with the on-call gynecologist today and received a prescription for something for nausea which has given slight relief of nausea but she continues to complain of nausea.   Past Medical History  Diagnosis Date  . Hypertension   . Osteoporosis, senile 08/2013    T score -2.8 overall stable to a slight decline from prior DEXA 2013  . Hypothyroidism   . Vertigo Nov 2013    Work up at [facility] for stroke-tests negative, no further problem  . Stroke    Past Surgical History  Procedure Laterality Date  . Back surgery  2006  . Tonsillectomy    . Tubal ligation    . Vaginal hysterectomy N/A 08/19/2012    Procedure: HYSTERECTOMY VAGINAL;  Surgeon: Marvene Staff, MD;  Location: [facility];  Service: Gynecology;  Laterality: N/A;  . Salpingoophorectomy Bilateral 08/19/2012    Procedure: SALPINGO OOPHORECTOMY;  Surgeon: Marvene Staff, MD;  Location: [facility];  Service: Gynecology;   Laterality: Bilateral;  . Cystoscopy N/A 08/19/2012    Procedure: CYSTOSCOPY;  Surgeon: Reece Packer, MD;  Location: [facility];  Service: Urology;  Laterality: N/A;  . Cystocele repair N/A 08/19/2012    Procedure: ANTERIOR REPAIR (CYSTOCELE);  Surgeon: Reece Packer, MD;  Location: [facility];  Service: Urology;  Laterality: N/A;  . Vaginal prolapse repair N/A 08/19/2012    Procedure: VAULT PROLAPSE WITH GRAFT;  Surgeon: Reece Packer, MD;  Location: [facility];  Service: Urology;  Laterality: N/A;   Family History  Problem Relation Age of Onset  . Stroke Father   . Cancer - Colon Mother    History  Substance Use Topics  . Smoking status: Never Smoker   . Smokeless tobacco: Never Used  . Alcohol Use: 0.0 oz/week    0 Standard drinks or equivalent per week     Comment: socially drinks wine   OB History    Gravida Para Term Preterm AB TAB SAB Ectopic Multiple Living   4 4        4      Review of Systems  All other systems reviewed and are negative.     Allergies  Percocet and Sulfa antibiotics  Home Medications   Prior to Admission medications   Medication Sig Start Date End Date Taking? Authorizing Provider  acetaminophen (TYLENOL) 500 MG tablet Take 1,000 mg by mouth every 6 (six) hours as needed for mild pain.   Yes Historical Provider, MD  Biotin 5000 MCG CAPS Take 5,000  mcg by mouth daily.   Yes Historical Provider, MD  Calcium-Vitamin D (CALTRATE 600 PLUS-VIT D PO) Take 1 tablet by mouth 2 (two) times daily.   Yes Historical Provider, MD  Cholecalciferol 1000 UNITS tablet Take 1,000 Units by mouth daily.   Yes Historical Provider, MD  DOCOSAHEXAENOIC ACID PO Take 1 tablet by mouth 2 (two) times daily.   Yes Historical Provider, MD  felodipine (PLENDIL) 10 MG 24 hr tablet Take 10 mg by mouth daily.   Yes Historical Provider, MD  ibuprofen (ADVIL,MOTRIN) 800 MG tablet Take 1 tablet (800 mg total) by mouth every 8 (eight) hours as needed (mild pain). 08/20/12  Yes  Sheronette A Cousins, MD  levothyroxine (SYNTHROID, LEVOTHROID) 88 MCG tablet Take 88 mcg by mouth daily before breakfast.   Yes Historical Provider, MD  lisinopril (PRINIVIL,ZESTRIL) 10 MG tablet Take 10 mg by mouth daily.   Yes Historical Provider, MD  Multiple Vitamin (MULTIVITAMIN WITH MINERALS) TABS Take 1 tablet by mouth daily. Macular Protect.   Yes Historical Provider, MD  Multiple Vitamins-Minerals (Caroline) MISC Take 1 tablet by mouth daily.   Yes Historical Provider, MD  ondansetron (ZOFRAN-ODT) 4 MG disintegrating tablet Take 1 tablet by mouth every 8 (eight) hours as needed. nausea 06/01/14 06/08/14 Yes Historical Provider, MD  Polyethyl Glycol-Propyl Glycol (SYSTANE OP) Apply 2 drops to eye daily as needed (for dry eyes.).   Yes Historical Provider, MD  polyethylene glycol (MIRALAX / GLYCOLAX) packet Take 17 g by mouth daily.   Yes Historical Provider, MD  aspirin EC 81 MG tablet Take 81 mg by mouth daily.    Historical Provider, MD   BP 169/78 mmHg  Pulse 81  Temp(Src) 98.3 F (36.8 C) (Oral)  Resp 20  SpO2 98% Physical Exam  Nursing note and vitals reviewed.  81 year old female, resting comfortably and in no acute distress. Vital signs are significant for hypertension. Oxygen saturation is 98%, which is normal. Head is normocephalic and atraumatic. PERRLA, EOMI. Oropharynx is clear. Neck is nontender and supple without adenopathy or JVD. Back is nontender and there is no CVA tenderness. Lungs are clear without rales, wheezes, or rhonchi. Chest is nontender. Heart has regular rate and rhythm without murmur. Abdomen is soft, flat, nontender with mild tenderness throughout the mid and lower abdomen. There is no rebound or guarding. Midline periumbilical scar from laparoscope is present. There are no masses or hepatosplenomegaly and peristalsis is normoactive. Extremities have no cyanosis or edema, full range of motion is present. Skin is warm and dry without  rash. Neurologic: Mental status is normal, cranial nerves are intact, there are no motor or sensory deficits.  ED Course  Procedures (including critical care time) Labs Review Results for orders placed or performed during the hospital encounter of 06/02/14  Comprehensive metabolic panel  Result Value Ref Range   Sodium 126 (L) 135 - 145 mmol/L   Potassium 3.3 (L) 3.5 - 5.1 mmol/L   Chloride 92 (L) 96 - 112 mmol/L   CO2 25 19 - 32 mmol/L   Glucose, Bld 114 (H) 70 - 99 mg/dL   BUN 14 6 - 23 mg/dL   Creatinine, Ser 0.52 0.50 - 1.10 mg/dL   Calcium 8.6 8.4 - 10.5 mg/dL   Total Protein 6.5 6.0 - 8.3 g/dL   Albumin 3.8 3.5 - 5.2 g/dL   AST 35 0 - 37 U/L   ALT 19 0 - 35 U/L   Alkaline Phosphatase 55 39 - 117  U/L   Total Bilirubin 0.8 0.3 - 1.2 mg/dL   GFR calc non Af Amer 87 (L) >90 mL/min   GFR calc Af Amer >90 >90 mL/min   Anion gap 9 5 - 15  Lipase, blood  Result Value Ref Range   Lipase 27 11 - 59 U/L  CBC with Differential  Result Value Ref Range   WBC 7.3 4.0 - 10.5 K/uL   RBC 3.58 (L) 3.87 - 5.11 MIL/uL   Hemoglobin 11.7 (L) 12.0 - 15.0 g/dL   HCT 33.9 (L) 36.0 - 46.0 %   MCV 94.7 78.0 - 100.0 fL   MCH 32.7 26.0 - 34.0 pg   MCHC 34.5 30.0 - 36.0 g/dL   RDW 14.7 11.5 - 15.5 %   Platelets 254 150 - 400 K/uL   Neutrophils Relative % 73 43 - 77 %   Neutro Abs 5.3 1.7 - 7.7 K/uL   Lymphocytes Relative 17 12 - 46 %   Lymphs Abs 1.3 0.7 - 4.0 K/uL   Monocytes Relative 9 3 - 12 %   Monocytes Absolute 0.7 0.1 - 1.0 K/uL   Eosinophils Relative 1 0 - 5 %   Eosinophils Absolute 0.1 0.0 - 0.7 K/uL   Basophils Relative 0 0 - 1 %   Basophils Absolute 0.0 0.0 - 0.1 K/uL  Urinalysis, Routine w reflex microscopic  Result Value Ref Range   Color, Urine YELLOW YELLOW   APPearance CLEAR CLEAR   Specific Gravity, Urine 1.010 1.005 - 1.030   pH 6.5 5.0 - 8.0   Glucose, UA NEGATIVE NEGATIVE mg/dL   Hgb urine dipstick TRACE (A) NEGATIVE   Bilirubin Urine NEGATIVE NEGATIVE   Ketones,  ur 15 (A) NEGATIVE mg/dL   Protein, ur NEGATIVE NEGATIVE mg/dL   Urobilinogen, UA 0.2 0.0 - 1.0 mg/dL   Nitrite NEGATIVE NEGATIVE   Leukocytes, UA NEGATIVE NEGATIVE  Urine microscopic-add on  Result Value Ref Range   Squamous Epithelial / LPF RARE RARE   RBC / HPF 0-2 <3 RBC/hpf  I-Stat CG4 Lactic Acid, ED  Result Value Ref Range   Lactic Acid, Venous 0.67 0.5 - 2.0 mmol/L   Imaging Review Ct Abdomen Pelvis W Contrast  06/03/2014   CLINICAL DATA:  Lower abdominal pain after pelvic reconstructive surgery 05/31/2014.  EXAM: CT ABDOMEN AND PELVIS WITH CONTRAST  TECHNIQUE: Multidetector CT imaging of the abdomen and pelvis was performed using the standard protocol following bolus administration of intravenous contrast.  CONTRAST:  49mL OMNIPAQUE IOHEXOL 300 MG/ML SOLN, 116mL OMNIPAQUE IOHEXOL 300 MG/ML SOLN  COMPARISON:  12/24/2006  FINDINGS: BODY WALL: Diffuse subcutaneous gas correlating with recent abdominal surgery  LOWER CHEST: Unremarkable.  ABDOMEN/PELVIS:  Liver: No focal abnormality.  Biliary: Chronic intrahepatic biliary dilatation. The gallbladder and common bile duct are negative.  Pancreas: Unremarkable.  Spleen: Unremarkable.  Adrenals: Unremarkable.  Kidneys and ureters: No hydronephrosis or stone.  Bladder: Distended urinary bladder without wall thickening. There is gas in the urinary bladder, possibly related to recent Foley catheter. No urine leak on delayed imaging.  Reproductive: Hysterectomy and probable oophorectomies. There is mild fat inflammation in the pelvis which is expected postoperatively. No hematoma or abscess.  Bowel: No obstruction.  Retroperitoneum: No mass or adenopathy.  Peritoneum: Small pneumoperitoneum and extraperitoneal gas correlating with recent surgery.  Vascular: No acute abnormality.  OSSEOUS: No acute abnormalities.  IMPRESSION: 1. Bladder distention, correlate for urinary retention. 2. Pelvic edema and small pneumoperitoneum correlating with recent surgery.    Electronically  Signed   By: Monte Fantasia M.D.   On: 06/03/2014 02:39   Images viewed by me.   MDM   Final diagnoses:  Abdominal pain, unspecified abdominal location  Post-operative pain  Acute urinary retention  Hyponatremia    Postoperative abdominal pain of uncertain cause. Screening labs will be obtained and she'll be given IV hydration. She'll be sent for CT scan of abdomen and pelvis.  Laboratory workup is significant for hyponatremia but she has had hyponatremia in the past I do not feel that this is clinically significant. CT scan show significant bladder distention. Patient states that she has been urinating small amount but I believe this is overflow incontinence. Foley catheter is placed yielding 750 mL of clear urine and she is feeling much better following this. She is discharged with Foley catheter in place and is referred to urology for follow-up. She did not have any narcotics at home for pain control so she is given a prescription for tramadol. She is given prescription for ondansetron take along with tramadol because she has severe nausea when taking narcotics. Follow-up with her surgeon as scheduled.  Delora Fuel, MD 16/10/96 0454  Patient has been taking ondansetron at home with no relief. She will be given prescription for metoclopramide.  Delora Fuel, MD 09/81/19 1478

## 2014-06-02 NOTE — ED Notes (Signed)
Pt states that she had surgery on Monday - pelvic reconstructive surgery at [facility]; pt states that she is having increased pain to lower abd and nausea and vomiting; pt and family advised that they called the on call physician at [facility] and they advised may need something other than Tylenol and Ibuprofen for pain; pt states that she saw the physician this am and he thinks it is just post op pain and did not order anything additional other than Zofran for nausea; pt states that the nausea is worse despite the Zofran

## 2014-06-03 ENCOUNTER — Encounter (HOSPITAL_COMMUNITY): Payer: Self-pay

## 2014-06-03 DIAGNOSIS — Z9889 Other specified postprocedural states: Secondary | ICD-10-CM | POA: Diagnosis not present

## 2014-06-03 DIAGNOSIS — K668 Other specified disorders of peritoneum: Secondary | ICD-10-CM | POA: Diagnosis not present

## 2014-06-03 DIAGNOSIS — R600 Localized edema: Secondary | ICD-10-CM | POA: Diagnosis not present

## 2014-06-03 DIAGNOSIS — R1030 Lower abdominal pain, unspecified: Secondary | ICD-10-CM | POA: Diagnosis not present

## 2014-06-03 DIAGNOSIS — N3289 Other specified disorders of bladder: Secondary | ICD-10-CM | POA: Diagnosis not present

## 2014-06-03 LAB — CBC WITH DIFFERENTIAL/PLATELET
BASOS PCT: 0 % (ref 0–1)
Basophils Absolute: 0 10*3/uL (ref 0.0–0.1)
EOS ABS: 0.1 10*3/uL (ref 0.0–0.7)
Eosinophils Relative: 1 % (ref 0–5)
HCT: 33.9 % — ABNORMAL LOW (ref 36.0–46.0)
Hemoglobin: 11.7 g/dL — ABNORMAL LOW (ref 12.0–15.0)
Lymphocytes Relative: 17 % (ref 12–46)
Lymphs Abs: 1.3 10*3/uL (ref 0.7–4.0)
MCH: 32.7 pg (ref 26.0–34.0)
MCHC: 34.5 g/dL (ref 30.0–36.0)
MCV: 94.7 fL (ref 78.0–100.0)
Monocytes Absolute: 0.7 10*3/uL (ref 0.1–1.0)
Monocytes Relative: 9 % (ref 3–12)
NEUTROS ABS: 5.3 10*3/uL (ref 1.7–7.7)
NEUTROS PCT: 73 % (ref 43–77)
PLATELETS: 254 10*3/uL (ref 150–400)
RBC: 3.58 MIL/uL — ABNORMAL LOW (ref 3.87–5.11)
RDW: 14.7 % (ref 11.5–15.5)
WBC: 7.3 10*3/uL (ref 4.0–10.5)

## 2014-06-03 LAB — URINALYSIS, ROUTINE W REFLEX MICROSCOPIC
Bilirubin Urine: NEGATIVE
GLUCOSE, UA: NEGATIVE mg/dL
KETONES UR: 15 mg/dL — AB
LEUKOCYTES UA: NEGATIVE
Nitrite: NEGATIVE
PROTEIN: NEGATIVE mg/dL
Specific Gravity, Urine: 1.01 (ref 1.005–1.030)
Urobilinogen, UA: 0.2 mg/dL (ref 0.0–1.0)
pH: 6.5 (ref 5.0–8.0)

## 2014-06-03 LAB — COMPREHENSIVE METABOLIC PANEL
ALT: 19 U/L (ref 0–35)
AST: 35 U/L (ref 0–37)
Albumin: 3.8 g/dL (ref 3.5–5.2)
Alkaline Phosphatase: 55 U/L (ref 39–117)
Anion gap: 9 (ref 5–15)
BILIRUBIN TOTAL: 0.8 mg/dL (ref 0.3–1.2)
BUN: 14 mg/dL (ref 6–23)
CHLORIDE: 92 mmol/L — AB (ref 96–112)
CO2: 25 mmol/L (ref 19–32)
Calcium: 8.6 mg/dL (ref 8.4–10.5)
Creatinine, Ser: 0.52 mg/dL (ref 0.50–1.10)
GFR calc Af Amer: >90 mL/min (ref >90)
GFR, EST NON AFRICAN AMERICAN: 87 mL/min — AB (ref >90)
GLUCOSE: 114 mg/dL — AB (ref 70–99)
Potassium: 3.3 mmol/L — ABNORMAL LOW (ref 3.5–5.1)
SODIUM: 126 mmol/L — AB (ref 135–145)
Total Protein: 6.5 g/dL (ref 6.0–8.3)

## 2014-06-03 LAB — URINE MICROSCOPIC-ADD ON

## 2014-06-03 LAB — LIPASE, BLOOD: Lipase: 27 U/L (ref 11–59)

## 2014-06-03 MED ORDER — TRAMADOL HCL 50 MG PO TABS: 50.0000 mg | ORAL_TABLET | Freq: Four times a day (QID) | ORAL | Status: DC | PRN

## 2014-06-03 MED ORDER — POTASSIUM CHLORIDE 10 MEQ/100ML IV SOLN
10.0000 meq | Freq: Once | INTRAVENOUS | Status: AC
  Administered 2014-06-03: 10 meq via INTRAVENOUS
  Filled 2014-06-03: qty 100

## 2014-06-03 MED ORDER — METOCLOPRAMIDE HCL 10 MG PO TABS: 10.0000 mg | ORAL_TABLET | Freq: Four times a day (QID) | ORAL | Status: DC

## 2014-06-03 MED ORDER — ONDANSETRON HCL 4 MG PO TABS: 4.0000 mg | ORAL_TABLET | Freq: Four times a day (QID) | ORAL | Status: DC | PRN

## 2014-06-03 MED ORDER — IOHEXOL 300 MG/ML  SOLN
100.0000 mL | Freq: Once | INTRAMUSCULAR | Status: AC | PRN
  Administered 2014-06-03: 100 mL via INTRAVENOUS

## 2014-06-03 NOTE — Discharge Instructions (Signed)
Acute Urinary Retention Acute urinary retention is the temporary inability to urinate. This is an uncommon problem in women. It can be caused by:  Infection.  A side effect of a medicine.  A problem in a nearby organ that presses or squeezes on the bladder or the urethra (the tube that drains the bladder).  Psychological problems.   Surgery on your bladder, urethra, or pelvic organs that causes obstruction to the outflow of urine from your bladder. HOME CARE INSTRUCTIONS  If you are sent home with a Foley catheter and a drainage system, you will need to discuss the best course of action with your health care provider. While the catheter is in, maintain a good intake of fluids. Keep the drainage bag emptied and lower than your catheter. This is so that contaminated urine will not flow back into your bladder, which could lead to a urinary tract infection. There are two main types of drainage bags. One is a large bag that usually is used at night. It has a good capacity that will allow you to sleep through the night without having to empty it. The second type is called a leg bag. It has a smaller capacity so it needs to be emptied more frequently. However, the main advantage is that it can be attached by a leg strap and goes underneath your clothing, allowing you the freedom to move about or leave your home. Only take over-the-counter or prescription medicines for pain, discomfort, or fever as directed by your health care provider.  SEEK MEDICAL CARE IF:  You develop a low-grade fever.  You experience spasms or leakage of urine with the spasms. SEEK IMMEDIATE MEDICAL CARE IF:   You develop chills or fever.  Your catheter stops draining urine.  Your catheter falls out.  You start to develop increased bleeding that does not respond to rest and increased fluid intake. MAKE SURE YOU:  Understand these instructions.  Will watch your condition.  Will get help right away if you are not  doing well or get worse. Document Released: 03/18/2006 Document Revised: 01/07/2013 Document Reviewed: 08/28/2012 [facility] Patient Information 2015 [place], [place]. This information is not intended to replace advice given to you by your health care provider. Make sure you discuss any questions you have with your health care provider.  Tramadol tablets What is this medicine? TRAMADOL (TRA ma dole) is a pain reliever. It is used to treat moderate to severe pain in adults. This medicine may be used for other purposes; ask your health care provider or pharmacist if you have questions. COMMON BRAND NAME(S): Ultram What should I tell my health care provider before I take this medicine? They need to know if you have any of these conditions: -brain tumor -depression -drug abuse or addiction -head injury -if you frequently drink alcohol containing drinks -kidney disease or trouble passing urine -liver disease -lung disease, asthma, or breathing problems -seizures or epilepsy -suicidal thoughts, plans, or attempt; a previous suicide attempt by you or a family member -an unusual or allergic reaction to tramadol, codeine, other medicines, foods, dyes, or preservatives -pregnant or trying to get pregnant -breast-feeding How should I use this medicine? Take this medicine by mouth with a full glass of water. Follow the directions on the prescription label. If the medicine upsets your stomach, take it with food or milk. Do not take more medicine than you are told to take. Talk to your pediatrician regarding the use of this medicine in children. Special care may be  needed. Overdosage: If you think you have taken too much of this medicine contact a poison control center or emergency room at once. NOTE: This medicine is only for you. Do not share this medicine with others. What if I miss a dose? If you miss a dose, take it as soon as you can. If it is almost time for your next dose, take only that dose.  Do not take double or extra doses. What may interact with this medicine? Do not take this medicine with any of the following medications: -MAOIs like Carbex, Eldepryl, Marplan, Nardil, and Parnate This medicine may also interact with the following medications: -alcohol or medicines that contain alcohol -antihistamines -benzodiazepines -bupropion -carbamazepine or oxcarbazepine -clozapine -cyclobenzaprine -digoxin -furazolidone -linezolid -medicines for depression, anxiety, or psychotic disturbances -medicines for migraine headache like almotriptan, eletriptan, frovatriptan, naratriptan, rizatriptan, sumatriptan, zolmitriptan -medicines for pain like pentazocine, buprenorphine, butorphanol, meperidine, nalbuphine, and propoxyphene -medicines for sleep -muscle relaxants -naltrexone -phenobarbital -phenothiazines like perphenazine, thioridazine, chlorpromazine, mesoridazine, fluphenazine, prochlorperazine, promazine, and trifluoperazine -procarbazine -warfarin This list may not describe all possible interactions. Give your health care provider a list of all the medicines, herbs, non-prescription drugs, or dietary supplements you use. Also tell them if you smoke, drink alcohol, or use illegal drugs. Some items may interact with your medicine. What should I watch for while using this medicine? Tell your doctor or health care professional if your pain does not go away, if it gets worse, or if you have new or a different type of pain. You may develop tolerance to the medicine. Tolerance means that you will need a higher dose of the medicine for pain relief. Tolerance is normal and is expected if you take this medicine for a long time. Do not suddenly stop taking your medicine because you may develop a severe reaction. Your body becomes used to the medicine. This does NOT mean you are addicted. Addiction is a behavior related to getting and using a drug for a non-medical reason. If you have pain,  you have a medical reason to take pain medicine. Your doctor will tell you how much medicine to take. If your doctor wants you to stop the medicine, the dose will be slowly lowered over time to avoid any side effects. You may get drowsy or dizzy. Do not drive, use machinery, or do anything that needs mental alertness until you know how this medicine affects you. Do not stand or sit up quickly, especially if you are an older patient. This reduces the risk of dizzy or fainting spells. Alcohol can increase or decrease the effects of this medicine. Avoid alcoholic drinks. You may have constipation. Try to have a bowel movement at least every 2 to 3 days. If you do not have a bowel movement for 3 days, call your doctor or health care professional. Your mouth may get dry. Chewing sugarless gum or sucking hard candy, and drinking plenty of water may help. Contact your doctor if the problem does not go away or is severe. What side effects may I notice from receiving this medicine? Side effects that you should report to your doctor or health care professional as soon as possible: -allergic reactions like skin rash, itching or hives, swelling of the face, lips, or tongue -breathing difficulties, wheezing -confusion -itching -light headedness or fainting spells -redness, blistering, peeling or loosening of the skin, including inside the mouth -seizures Side effects that usually do not require medical attention (report to your doctor or health care professional if they  continue or are bothersome): -constipation -dizziness -drowsiness -headache -nausea, vomiting This list may not describe all possible side effects. Call your doctor for medical advice about side effects. You may report side effects to FDA at 1-800-FDA-1088. Where should I keep my medicine? Keep out of the reach of children. Store at room temperature between 15 and 30 degrees C (59 and 86 degrees F). Keep container tightly closed. Throw away  any unused medicine after the expiration date. NOTE: This sheet is a summary. It may not cover all possible information. If you have questions about this medicine, talk to your doctor, pharmacist, or health care provider.  2015, Elsevier/Gold Standard. (2009-11-30 11:55:44)  Ondansetron tablets What is this medicine? ONDANSETRON (on DAN se tron) is used to treat nausea and vomiting caused by chemotherapy. It is also used to prevent or treat nausea and vomiting after surgery. This medicine may be used for other purposes; ask your health care provider or pharmacist if you have questions. COMMON BRAND NAME(S): Zofran What should I tell my health care provider before I take this medicine? They need to know if you have any of these conditions: -heart disease -history of irregular heartbeat -liver disease -low levels of magnesium or potassium in the blood -an unusual or allergic reaction to ondansetron, granisetron, other medicines, foods, dyes, or preservatives -pregnant or trying to get pregnant -breast-feeding How should I use this medicine? Take this medicine by mouth with a glass of water. Follow the directions on your prescription label. Take your doses at regular intervals. Do not take your medicine more often than directed. Talk to your pediatrician regarding the use of this medicine in children. Special care may be needed. Overdosage: If you think you have taken too much of this medicine contact a poison control center or emergency room at once. NOTE: This medicine is only for you. Do not share this medicine with others. What if I miss a dose? If you miss a dose, take it as soon as you can. If it is almost time for your next dose, take only that dose. Do not take double or extra doses. What may interact with this medicine? Do not take this medicine with any of the following medications: -apomorphine -certain medicines for fungal infections like fluconazole, itraconazole, ketoconazole,  posaconazole, voriconazole -cisapride -dofetilide -dronedarone -pimozide -thioridazine -ziprasidone This medicine may also interact with the following medications: -carbamazepine -certain medicines for depression, anxiety, or psychotic disturbances -fentanyl -linezolid -MAOIs like Carbex, Eldepryl, Marplan, Nardil, and Parnate -methylene blue (injected into a vein) -other medicines that prolong the QT interval (cause an abnormal heart rhythm) -phenytoin -rifampicin -tramadol This list may not describe all possible interactions. Give your health care provider a list of all the medicines, herbs, non-prescription drugs, or dietary supplements you use. Also tell them if you smoke, drink alcohol, or use illegal drugs. Some items may interact with your medicine. What should I watch for while using this medicine? Check with your doctor or health care professional right away if you have any sign of an allergic reaction. What side effects may I notice from receiving this medicine? Side effects that you should report to your doctor or health care professional as soon as possible: -allergic reactions like skin rash, itching or hives, swelling of the face, lips or tongue -breathing problems -confusion -dizziness -fast or irregular heartbeat -feeling faint or lightheaded, falls -fever and chills -loss of balance or coordination -seizures -sweating -swelling of the hands or feet -tightness in the chest -tremors -unusually weak  or tired Side effects that usually do not require medical attention (report to your doctor or health care professional if they continue or are bothersome): -constipation or diarrhea -headache This list may not describe all possible side effects. Call your doctor for medical advice about side effects. You may report side effects to FDA at 1-800-FDA-1088. Where should I keep my medicine? Keep out of the reach of children. Store between 2 and 30 degrees C (36 and 86  degrees F). Throw away any unused medicine after the expiration date. NOTE: This sheet is a summary. It may not cover all possible information. If you have questions about this medicine, talk to your doctor, pharmacist, or health care provider.  2015, Elsevier/Gold Standard. (2012-12-24 16:27:45)

## 2014-06-03 NOTE — ED Notes (Signed)
Returned from CT.

## 2014-06-04 ENCOUNTER — Encounter (HOSPITAL_COMMUNITY): Payer: Self-pay | Admitting: *Deleted

## 2014-06-04 ENCOUNTER — Emergency Department (HOSPITAL_COMMUNITY)
Admission: EM | Admit: 2014-06-04 | Discharge: 2014-06-04 | Disposition: A | Discharge status: Home / Self Care | Payer: Medicare Other | Attending: Emergency Medicine | Admitting: Emergency Medicine

## 2014-06-04 DIAGNOSIS — Z8673 Personal history of transient ischemic attack (TIA), and cerebral infarction without residual deficits: Secondary | ICD-10-CM | POA: Insufficient documentation

## 2014-06-04 DIAGNOSIS — R630 Anorexia: Secondary | ICD-10-CM | POA: Diagnosis not present

## 2014-06-04 DIAGNOSIS — Z79899 Other long term (current) drug therapy: Secondary | ICD-10-CM | POA: Insufficient documentation

## 2014-06-04 DIAGNOSIS — M810 Age-related osteoporosis without current pathological fracture: Secondary | ICD-10-CM | POA: Insufficient documentation

## 2014-06-04 DIAGNOSIS — E039 Hypothyroidism, unspecified: Secondary | ICD-10-CM | POA: Insufficient documentation

## 2014-06-04 DIAGNOSIS — R110 Nausea: Secondary | ICD-10-CM | POA: Insufficient documentation

## 2014-06-04 DIAGNOSIS — I10 Essential (primary) hypertension: Secondary | ICD-10-CM | POA: Diagnosis not present

## 2014-06-04 DIAGNOSIS — R1084 Generalized abdominal pain: Secondary | ICD-10-CM | POA: Diagnosis not present

## 2014-06-04 DIAGNOSIS — Z9851 Tubal ligation status: Secondary | ICD-10-CM | POA: Insufficient documentation

## 2014-06-04 DIAGNOSIS — Z7982 Long term (current) use of aspirin: Secondary | ICD-10-CM | POA: Insufficient documentation

## 2014-06-04 DIAGNOSIS — Z8742 Personal history of other diseases of the female genital tract: Secondary | ICD-10-CM | POA: Diagnosis not present

## 2014-06-04 DIAGNOSIS — K5900 Constipation, unspecified: Secondary | ICD-10-CM | POA: Diagnosis not present

## 2014-06-04 NOTE — ED Notes (Addendum)
Patient initially thought there was an issue with her catheter but it was discovered that the catheter was clamped and not draining. Patient currently has good urine flow, clear and yellow. The patient has a surgical procedure for prolapse uterus and bladder on Monday and has not had a bowel movement since the day before the procedure. She has been doubling her miralax as advised by her doctor due to the pain medication and her inability to have a bowel movement. She has been nauseated since the surgery.

## 2014-06-04 NOTE — ED Provider Notes (Signed)
CSN: [id number]     Arrival date & time 06/04/14  1227 History   First MD Initiated Contact with Patient 06/04/14 1259     Chief Complaint  Patient presents with  . Constipation     (Consider location/radiation/quality/duration/timing/severity/associated sxs/prior Treatment) Patient is a 81 y.o. female presenting with abdominal pain.  Abdominal Pain Pain location:  Generalized Pain quality: aching, bloating and dull   Pain radiates to:  Does not radiate Pain severity:  Mild Onset quality:  Gradual Duration:  5 days Timing:  Constant Progression:  Unchanged Chronicity:  Recurrent Context comment:  Recent pelvic reconstruction Relieved by:  Nothing Worsened by:  Movement and palpation Associated symptoms: anorexia and nausea   Associated symptoms: no cough, no dysuria, no fever and no vomiting     Past Medical History  Diagnosis Date  . Hypertension   . Osteoporosis, senile 08/2013    T score -2.8 overall stable to a slight decline from prior DEXA 2013  . Hypothyroidism   . Vertigo Nov 2013    Work up at [facility] for stroke-tests negative, no further problem  . Stroke    Past Surgical History  Procedure Laterality Date  . Back surgery  2006  . Tonsillectomy    . Tubal ligation    . Vaginal hysterectomy N/A 08/19/2012    Procedure: HYSTERECTOMY VAGINAL;  Surgeon: Marvene Staff, MD;  Location: [facility];  Service: Gynecology;  Laterality: N/A;  . Salpingoophorectomy Bilateral 08/19/2012    Procedure: SALPINGO OOPHORECTOMY;  Surgeon: Marvene Staff, MD;  Location: [facility];  Service: Gynecology;  Laterality: Bilateral;  . Cystoscopy N/A 08/19/2012    Procedure: CYSTOSCOPY;  Surgeon: Reece Packer, MD;  Location: [facility];  Service: Urology;  Laterality: N/A;  . Cystocele repair N/A 08/19/2012    Procedure: ANTERIOR REPAIR (CYSTOCELE);  Surgeon: Reece Packer, MD;  Location: [facility];  Service: Urology;  Laterality: N/A;  . Vaginal prolapse repair N/A 08/19/2012     Procedure: VAULT PROLAPSE WITH GRAFT;  Surgeon: Reece Packer, MD;  Location: [facility];  Service: Urology;  Laterality: N/A;   Family History  Problem Relation Age of Onset  . Stroke Father   . Cancer - Colon Mother    History  Substance Use Topics  . Smoking status: Never Smoker   . Smokeless tobacco: Never Used  . Alcohol Use: 0.0 oz/week    0 Standard drinks or equivalent per week     Comment: socially drinks wine   OB History    Gravida Para Term Preterm AB TAB SAB Ectopic Multiple Living   4 4        4      Review of Systems  Constitutional: Negative for fever.  Respiratory: Negative for cough.   Gastrointestinal: Positive for nausea, abdominal pain and anorexia. Negative for vomiting.  Genitourinary: Negative for dysuria.  All other systems reviewed and are negative.     Allergies  Anesthetics, amide; Percocet; and Sulfa antibiotics  Home Medications   Prior to Admission medications   Medication Sig Start Date End Date Taking? Authorizing Provider  aspirin EC 81 MG tablet Take 81 mg by mouth daily.   Yes Historical Provider, MD  Calcium-Vitamin D (CALTRATE 600 PLUS-VIT D PO) Take 1 tablet by mouth 2 (two) times daily.   Yes Historical Provider, MD  Cholecalciferol 1000 UNITS tablet Take 1,000 Units by mouth daily.   Yes Historical Provider, MD  DOCOSAHEXAENOIC ACID PO Take 1 tablet by mouth 2 (two)  times daily.   Yes Historical Provider, MD  felodipine (PLENDIL) 10 MG 24 hr tablet Take 5 mg by mouth daily.    Yes Historical Provider, MD  levothyroxine (SYNTHROID, LEVOTHROID) 88 MCG tablet Take 88 mcg by mouth daily before breakfast.   Yes Historical Provider, MD  metoCLOPramide (REGLAN) 10 MG tablet Take 1 tablet (10 mg total) by mouth every 6 (six) hours. 04/07/08  Yes Delora Fuel, MD  Multiple Vitamin (MULTIVITAMIN WITH MINERALS) TABS Take 1 tablet by mouth daily. Macular Protect.   Yes Historical Provider, MD  Multiple Vitamins-Minerals ([place]) MISC  Take 1 tablet by mouth daily.   Yes Historical Provider, MD  Polyethyl Glycol-Propyl Glycol (SYSTANE OP) Apply 2 drops to eye daily as needed (for dry eyes.).   Yes Historical Provider, MD  polyethylene glycol (MIRALAX / GLYCOLAX) packet Take 17 g by mouth daily.   Yes Historical Provider, MD  traMADol (ULTRAM) 50 MG tablet Take 1 tablet (50 mg total) by mouth every 6 (six) hours as needed. Patient taking differently: Take 50 mg by mouth every 6 (six) hours as needed for moderate pain.  12/07/02  Yes Delora Fuel, MD  acetaminophen (TYLENOL) 500 MG tablet Take 1,000 mg by mouth every 6 (six) hours as needed for mild pain.    Historical Provider, MD  ibuprofen (ADVIL,MOTRIN) 800 MG tablet Take 1 tablet (800 mg total) by mouth every 8 (eight) hours as needed (mild pain). 08/20/12   Sheronette A Cousins, MD  ondansetron (ZOFRAN) 4 MG tablet Take 1 tablet (4 mg total) by mouth every 6 (six) hours as needed for nausea or vomiting. 08/04/07   Delora Fuel, MD  ondansetron (ZOFRAN-ODT) 4 MG disintegrating tablet Take 1 tablet by mouth every 8 (eight) hours as needed. nausea 06/01/14 06/08/14  Historical Provider, MD   BP 169/66 mmHg  Pulse 80  Temp(Src) 98.6 F (37 C) (Oral)  Resp 16  Ht 5\' 1"  (1.549 m)  Wt 118 lb (53.524 kg)  BMI 22.31 kg/m2  SpO2 99% Physical Exam  Constitutional: She is oriented to person, place, and time. She appears well-developed and well-nourished.  HENT:  Head: Normocephalic and atraumatic.  Right Ear: External ear normal.  Left Ear: External ear normal.  Eyes: Conjunctivae and EOM are normal. Pupils are equal, round, and reactive to light.  Neck: Normal range of motion. Neck supple.  Cardiovascular: Normal rate, regular rhythm, normal heart sounds and intact distal pulses.   Pulmonary/Chest: Effort normal and breath sounds normal.  Abdominal: Soft. Bowel sounds are normal. She exhibits distension. There is generalized tenderness (very mild).  Musculoskeletal: Normal range of  motion.  Neurological: She is alert and oriented to person, place, and time.  Skin: Skin is warm and dry.  Vitals reviewed.   ED Course  Procedures (including critical care time) Labs Review Labs Reviewed - No data to display  Imaging Review Ct Abdomen Pelvis W Contrast  06/03/2014   CLINICAL DATA:  Lower abdominal pain after pelvic reconstructive surgery 05/31/2014.  EXAM: CT ABDOMEN AND PELVIS WITH CONTRAST  TECHNIQUE: Multidetector CT imaging of the abdomen and pelvis was performed using the standard protocol following bolus administration of intravenous contrast.  CONTRAST:  64mL OMNIPAQUE IOHEXOL 300 MG/ML SOLN, 126mL OMNIPAQUE IOHEXOL 300 MG/ML SOLN  COMPARISON:  12/24/2006  FINDINGS: BODY WALL: Diffuse subcutaneous gas correlating with recent abdominal surgery  LOWER CHEST: Unremarkable.  ABDOMEN/PELVIS:  Liver: No focal abnormality.  Biliary: Chronic intrahepatic biliary dilatation. The gallbladder and common bile duct  are negative.  Pancreas: Unremarkable.  Spleen: Unremarkable.  Adrenals: Unremarkable.  Kidneys and ureters: No hydronephrosis or stone.  Bladder: Distended urinary bladder without wall thickening. There is gas in the urinary bladder, possibly related to recent Foley catheter. No urine leak on delayed imaging.  Reproductive: Hysterectomy and probable oophorectomies. There is mild fat inflammation in the pelvis which is expected postoperatively. No hematoma or abscess.  Bowel: No obstruction.  Retroperitoneum: No mass or adenopathy.  Peritoneum: Small pneumoperitoneum and extraperitoneal gas correlating with recent surgery.  Vascular: No acute abnormality.  OSSEOUS: No acute abnormalities.  IMPRESSION: 1. Bladder distention, correlate for urinary retention. 2. Pelvic edema and small pneumoperitoneum correlating with recent surgery.   Electronically Signed   By: Monte Fantasia M.D.   On: 06/03/2014 02:39     EKG Interpretation None      MDM   Final diagnoses:   Constipation, unspecified constipation type  Generalized abdominal pain  Nausea    81 y.o. female with pertinent PMH of rectocele, recent pelvic reconstruction at [facility] presents with continued abd pain and constipation.  Pt seen last night with negative CT scan, sent home with narcotics.  Last BM was just prior to surgery, pt states she routinely has post operative constipation with prior surgeries.  Physical exam today as above.  Small amount of soft stool in rectal vault, external hemorrhoid at 6 o clock, nonthrombosed.  Pt has no new symptoms since CT scan yesterday, has not vomited since that time.  Her abd exam is reassuring.  Discussed utility of repeat imaging and high risk for SBO and with shared decision making agreed to defer at this time.  I discussed that she should use 7 capfuls of miralax in 24 ounces of liquid one time to help attempt to have a bm and to take an enema PRN.  She gladly requested to return home as she stated she was glad she did not have an impaction.  DC home in stable condition with strict return precautions for worsening symptoms.    I have reviewed all laboratory and imaging studies if ordered as above  1. Constipation, unspecified constipation type   2. Generalized abdominal pain   3. Nausea         Debby Freiberg, MD 06/04/14 [phone]

## 2014-06-04 NOTE — Discharge Instructions (Signed)

## 2014-06-28 DIAGNOSIS — I10 Essential (primary) hypertension: Secondary | ICD-10-CM | POA: Diagnosis not present

## 2014-06-28 DIAGNOSIS — E039 Hypothyroidism, unspecified: Secondary | ICD-10-CM | POA: Diagnosis not present

## 2014-06-28 DIAGNOSIS — Z23 Encounter for immunization: Secondary | ICD-10-CM | POA: Diagnosis not present

## 2014-07-05 ENCOUNTER — Encounter: Payer: Self-pay | Admitting: Gynecology

## 2014-07-05 ENCOUNTER — Telehealth: Payer: Self-pay | Admitting: *Deleted

## 2014-07-05 ENCOUNTER — Ambulatory Visit (INDEPENDENT_AMBULATORY_CARE_PROVIDER_SITE_OTHER): Payer: Medicare Other | Admitting: Gynecology

## 2014-07-05 VITALS — BP 114/66

## 2014-07-05 DIAGNOSIS — IMO0001 Reserved for inherently not codable concepts without codable children: Secondary | ICD-10-CM

## 2014-07-05 DIAGNOSIS — T814XXA Infection following a procedure, initial encounter: Secondary | ICD-10-CM | POA: Diagnosis not present

## 2014-07-05 MED ORDER — DOXYCYCLINE HYCLATE 100 MG PO CAPS: 100.0000 mg | ORAL_CAPSULE | Freq: Two times a day (BID) | ORAL | Status: DC

## 2014-07-05 NOTE — Telephone Encounter (Signed)
Breanna [name] will call pt and let her know, okay to come.

## 2014-07-05 NOTE — Telephone Encounter (Signed)
Pt had robotic sacral colpopexy at [facility] on 05/31/14 pt said she noticed on Saturday at her navel/incison site it was red, so she called [facility] and spoke with the nurse and was told to watch for now. Pt said this am she noticed the same area has a scab on it, no drainage, bleeding, pain in this area. Patient has follow up appointment next Wednesday.  Pt called [facility] again and spoke with the nurse to inform her of the change and the nurse told pt to come see you to get a possible culture? Should patient make OV with you? Please advise

## 2014-07-05 NOTE — Progress Notes (Signed)
Breanna [name] January 26, 1931 [id number]        81 y.o.  G4P4 presents having had a robotic sacral colpopexy at [facility] to 29 2016. She noticed several days ago her infraumbilical incision area became red. Talked to a nurse at [facility] and was told to watch it. It subsequently scabbed over this morning although no significant drainage and she notes that the red area has decreased. She is otherwise doing well eating, drinking, voiding, bowel movements without issue. No fever or chills  Past medical history,surgical history, problem list, medications, allergies, family history and social history were all reviewed and documented in the EPIC chart.  Directed ROS with pertinent positives and negatives documented in the history of present illness/assessment and plan.  Exam: Filed Vitals:   07/05/14 1535  BP: 114/66   General appearance:  Normal Infraumbilical area with a quarter size erythematous region. Some underlying subcutaneous induration consistent with postoperative healing but no fluctuance to suggest fluid collection or abscess. No tenderness to manipulation. Skin incision is well-healed without drainage.  Assessment/Plan:  81 y.o. G4P4 with small red area in the infraumbilical incision site. Patient notes that it actually is improved since it was first noticed several days ago. Apparent drainage this morning but not evident now. Skin appears well approximated. Did not do any cultures as it really was nothing to culture other than the skin surface. Recommended heat to the area and I'm going to cover her with doxycycline 100 mg twice a day 7 days. She does have a follow up appointment next week with [facility]. She is allergic to sulfa antibiotics.  Reviewed with patient that if the area starts to get worse, tender, enlarge or drain then I recommended that she call [facility] and follow up with them as they did her surgery.    Anastasio Auerbach MD, 3:49 PM 07/05/2014

## 2014-07-05 NOTE — Telephone Encounter (Signed)
Okay to she can come here recognizing that if she has a significant finding I will refer her back to [facility] is a did her surgery.

## 2014-07-05 NOTE — Patient Instructions (Signed)
Take the antibiotic twice daily for 7 days. Apply heat to the peri-umbilical area. Follow up if symptoms persist or worsen.

## 2014-07-14 DIAGNOSIS — N361 Urethral diverticulum: Secondary | ICD-10-CM | POA: Diagnosis not present

## 2014-07-14 DIAGNOSIS — N993 Prolapse of vaginal vault after hysterectomy: Secondary | ICD-10-CM | POA: Diagnosis not present

## 2014-08-04 DIAGNOSIS — M5032 Other cervical disc degeneration, mid-cervical region: Secondary | ICD-10-CM | POA: Diagnosis not present

## 2014-08-04 DIAGNOSIS — M9901 Segmental and somatic dysfunction of cervical region: Secondary | ICD-10-CM | POA: Diagnosis not present

## 2014-08-04 DIAGNOSIS — M4712 Other spondylosis with myelopathy, cervical region: Secondary | ICD-10-CM | POA: Diagnosis not present

## 2014-08-04 DIAGNOSIS — M542 Cervicalgia: Secondary | ICD-10-CM | POA: Diagnosis not present

## 2014-08-10 DIAGNOSIS — M5032 Other cervical disc degeneration, mid-cervical region: Secondary | ICD-10-CM | POA: Diagnosis not present

## 2014-08-10 DIAGNOSIS — M9901 Segmental and somatic dysfunction of cervical region: Secondary | ICD-10-CM | POA: Diagnosis not present

## 2014-08-10 DIAGNOSIS — M4712 Other spondylosis with myelopathy, cervical region: Secondary | ICD-10-CM | POA: Diagnosis not present

## 2014-08-10 DIAGNOSIS — M542 Cervicalgia: Secondary | ICD-10-CM | POA: Diagnosis not present

## 2014-08-11 DIAGNOSIS — M9901 Segmental and somatic dysfunction of cervical region: Secondary | ICD-10-CM | POA: Diagnosis not present

## 2014-08-11 DIAGNOSIS — M542 Cervicalgia: Secondary | ICD-10-CM | POA: Diagnosis not present

## 2014-08-11 DIAGNOSIS — M4712 Other spondylosis with myelopathy, cervical region: Secondary | ICD-10-CM | POA: Diagnosis not present

## 2014-08-11 DIAGNOSIS — M5032 Other cervical disc degeneration, mid-cervical region: Secondary | ICD-10-CM | POA: Diagnosis not present

## 2014-08-13 DIAGNOSIS — M4712 Other spondylosis with myelopathy, cervical region: Secondary | ICD-10-CM | POA: Diagnosis not present

## 2014-08-13 DIAGNOSIS — M5032 Other cervical disc degeneration, mid-cervical region: Secondary | ICD-10-CM | POA: Diagnosis not present

## 2014-08-13 DIAGNOSIS — M542 Cervicalgia: Secondary | ICD-10-CM | POA: Diagnosis not present

## 2014-08-13 DIAGNOSIS — M9901 Segmental and somatic dysfunction of cervical region: Secondary | ICD-10-CM | POA: Diagnosis not present

## 2014-08-16 DIAGNOSIS — M542 Cervicalgia: Secondary | ICD-10-CM | POA: Diagnosis not present

## 2014-08-16 DIAGNOSIS — M5032 Other cervical disc degeneration, mid-cervical region: Secondary | ICD-10-CM | POA: Diagnosis not present

## 2014-08-16 DIAGNOSIS — M4712 Other spondylosis with myelopathy, cervical region: Secondary | ICD-10-CM | POA: Diagnosis not present

## 2014-08-16 DIAGNOSIS — M9901 Segmental and somatic dysfunction of cervical region: Secondary | ICD-10-CM | POA: Diagnosis not present

## 2014-08-18 DIAGNOSIS — M542 Cervicalgia: Secondary | ICD-10-CM | POA: Diagnosis not present

## 2014-08-18 DIAGNOSIS — M5032 Other cervical disc degeneration, mid-cervical region: Secondary | ICD-10-CM | POA: Diagnosis not present

## 2014-08-18 DIAGNOSIS — H3531 Nonexudative age-related macular degeneration: Secondary | ICD-10-CM | POA: Diagnosis not present

## 2014-08-18 DIAGNOSIS — M4712 Other spondylosis with myelopathy, cervical region: Secondary | ICD-10-CM | POA: Diagnosis not present

## 2014-08-18 DIAGNOSIS — M9901 Segmental and somatic dysfunction of cervical region: Secondary | ICD-10-CM | POA: Diagnosis not present

## 2014-08-18 DIAGNOSIS — H35341 Macular cyst, hole, or pseudohole, right eye: Secondary | ICD-10-CM | POA: Diagnosis not present

## 2014-08-20 ENCOUNTER — Encounter (INDEPENDENT_AMBULATORY_CARE_PROVIDER_SITE_OTHER): Payer: Medicare Other | Admitting: Ophthalmology

## 2014-08-20 DIAGNOSIS — H43813 Vitreous degeneration, bilateral: Secondary | ICD-10-CM | POA: Diagnosis not present

## 2014-08-20 DIAGNOSIS — H3531 Nonexudative age-related macular degeneration: Secondary | ICD-10-CM | POA: Diagnosis not present

## 2014-08-20 DIAGNOSIS — I10 Essential (primary) hypertension: Secondary | ICD-10-CM

## 2014-08-20 DIAGNOSIS — H35033 Hypertensive retinopathy, bilateral: Secondary | ICD-10-CM

## 2014-08-24 DIAGNOSIS — M4712 Other spondylosis with myelopathy, cervical region: Secondary | ICD-10-CM | POA: Diagnosis not present

## 2014-08-24 DIAGNOSIS — M9901 Segmental and somatic dysfunction of cervical region: Secondary | ICD-10-CM | POA: Diagnosis not present

## 2014-08-24 DIAGNOSIS — Z1231 Encounter for screening mammogram for malignant neoplasm of breast: Secondary | ICD-10-CM | POA: Diagnosis not present

## 2014-08-24 DIAGNOSIS — M542 Cervicalgia: Secondary | ICD-10-CM | POA: Diagnosis not present

## 2014-08-24 DIAGNOSIS — M5032 Other cervical disc degeneration, mid-cervical region: Secondary | ICD-10-CM | POA: Diagnosis not present

## 2014-08-25 ENCOUNTER — Encounter: Payer: Self-pay | Admitting: Gynecology

## 2014-08-25 DIAGNOSIS — M9901 Segmental and somatic dysfunction of cervical region: Secondary | ICD-10-CM | POA: Diagnosis not present

## 2014-08-25 DIAGNOSIS — M542 Cervicalgia: Secondary | ICD-10-CM | POA: Diagnosis not present

## 2014-08-25 DIAGNOSIS — M5032 Other cervical disc degeneration, mid-cervical region: Secondary | ICD-10-CM | POA: Diagnosis not present

## 2014-08-25 DIAGNOSIS — M4712 Other spondylosis with myelopathy, cervical region: Secondary | ICD-10-CM | POA: Diagnosis not present

## 2014-08-27 DIAGNOSIS — M4712 Other spondylosis with myelopathy, cervical region: Secondary | ICD-10-CM | POA: Diagnosis not present

## 2014-08-27 DIAGNOSIS — M542 Cervicalgia: Secondary | ICD-10-CM | POA: Diagnosis not present

## 2014-08-27 DIAGNOSIS — M9901 Segmental and somatic dysfunction of cervical region: Secondary | ICD-10-CM | POA: Diagnosis not present

## 2014-08-27 DIAGNOSIS — M5032 Other cervical disc degeneration, mid-cervical region: Secondary | ICD-10-CM | POA: Diagnosis not present

## 2014-09-06 DIAGNOSIS — M5032 Other cervical disc degeneration, mid-cervical region: Secondary | ICD-10-CM | POA: Diagnosis not present

## 2014-09-06 DIAGNOSIS — M542 Cervicalgia: Secondary | ICD-10-CM | POA: Diagnosis not present

## 2014-09-06 DIAGNOSIS — M9901 Segmental and somatic dysfunction of cervical region: Secondary | ICD-10-CM | POA: Diagnosis not present

## 2014-09-06 DIAGNOSIS — M4712 Other spondylosis with myelopathy, cervical region: Secondary | ICD-10-CM | POA: Diagnosis not present

## 2014-09-08 DIAGNOSIS — M5032 Other cervical disc degeneration, mid-cervical region: Secondary | ICD-10-CM | POA: Diagnosis not present

## 2014-09-08 DIAGNOSIS — M4712 Other spondylosis with myelopathy, cervical region: Secondary | ICD-10-CM | POA: Diagnosis not present

## 2014-09-08 DIAGNOSIS — M9901 Segmental and somatic dysfunction of cervical region: Secondary | ICD-10-CM | POA: Diagnosis not present

## 2014-09-08 DIAGNOSIS — M542 Cervicalgia: Secondary | ICD-10-CM | POA: Diagnosis not present

## 2014-09-09 DIAGNOSIS — M5032 Other cervical disc degeneration, mid-cervical region: Secondary | ICD-10-CM | POA: Diagnosis not present

## 2014-09-09 DIAGNOSIS — M9901 Segmental and somatic dysfunction of cervical region: Secondary | ICD-10-CM | POA: Diagnosis not present

## 2014-09-09 DIAGNOSIS — M542 Cervicalgia: Secondary | ICD-10-CM | POA: Diagnosis not present

## 2014-09-09 DIAGNOSIS — M4712 Other spondylosis with myelopathy, cervical region: Secondary | ICD-10-CM | POA: Diagnosis not present

## 2014-09-16 DIAGNOSIS — M542 Cervicalgia: Secondary | ICD-10-CM | POA: Diagnosis not present

## 2014-09-16 DIAGNOSIS — M5032 Other cervical disc degeneration, mid-cervical region: Secondary | ICD-10-CM | POA: Diagnosis not present

## 2014-09-16 DIAGNOSIS — M9901 Segmental and somatic dysfunction of cervical region: Secondary | ICD-10-CM | POA: Diagnosis not present

## 2014-09-16 DIAGNOSIS — M4712 Other spondylosis with myelopathy, cervical region: Secondary | ICD-10-CM | POA: Diagnosis not present

## 2014-09-17 DIAGNOSIS — M4712 Other spondylosis with myelopathy, cervical region: Secondary | ICD-10-CM | POA: Diagnosis not present

## 2014-09-17 DIAGNOSIS — M5032 Other cervical disc degeneration, mid-cervical region: Secondary | ICD-10-CM | POA: Diagnosis not present

## 2014-09-17 DIAGNOSIS — M542 Cervicalgia: Secondary | ICD-10-CM | POA: Diagnosis not present

## 2014-09-17 DIAGNOSIS — M9901 Segmental and somatic dysfunction of cervical region: Secondary | ICD-10-CM | POA: Diagnosis not present

## 2014-09-20 DIAGNOSIS — M4712 Other spondylosis with myelopathy, cervical region: Secondary | ICD-10-CM | POA: Diagnosis not present

## 2014-09-20 DIAGNOSIS — M9901 Segmental and somatic dysfunction of cervical region: Secondary | ICD-10-CM | POA: Diagnosis not present

## 2014-09-20 DIAGNOSIS — M542 Cervicalgia: Secondary | ICD-10-CM | POA: Diagnosis not present

## 2014-09-20 DIAGNOSIS — M5032 Other cervical disc degeneration, mid-cervical region: Secondary | ICD-10-CM | POA: Diagnosis not present

## 2014-09-22 DIAGNOSIS — M542 Cervicalgia: Secondary | ICD-10-CM | POA: Diagnosis not present

## 2014-09-22 DIAGNOSIS — M5032 Other cervical disc degeneration, mid-cervical region: Secondary | ICD-10-CM | POA: Diagnosis not present

## 2014-09-22 DIAGNOSIS — M4712 Other spondylosis with myelopathy, cervical region: Secondary | ICD-10-CM | POA: Diagnosis not present

## 2014-09-22 DIAGNOSIS — M9901 Segmental and somatic dysfunction of cervical region: Secondary | ICD-10-CM | POA: Diagnosis not present

## 2014-09-27 DIAGNOSIS — M4712 Other spondylosis with myelopathy, cervical region: Secondary | ICD-10-CM | POA: Diagnosis not present

## 2014-09-27 DIAGNOSIS — M542 Cervicalgia: Secondary | ICD-10-CM | POA: Diagnosis not present

## 2014-09-27 DIAGNOSIS — M9901 Segmental and somatic dysfunction of cervical region: Secondary | ICD-10-CM | POA: Diagnosis not present

## 2014-09-27 DIAGNOSIS — M5032 Other cervical disc degeneration, mid-cervical region: Secondary | ICD-10-CM | POA: Diagnosis not present

## 2014-09-30 DIAGNOSIS — M5032 Other cervical disc degeneration, mid-cervical region: Secondary | ICD-10-CM | POA: Diagnosis not present

## 2014-09-30 DIAGNOSIS — M4712 Other spondylosis with myelopathy, cervical region: Secondary | ICD-10-CM | POA: Diagnosis not present

## 2014-09-30 DIAGNOSIS — M9901 Segmental and somatic dysfunction of cervical region: Secondary | ICD-10-CM | POA: Diagnosis not present

## 2014-09-30 DIAGNOSIS — M542 Cervicalgia: Secondary | ICD-10-CM | POA: Diagnosis not present

## 2014-10-07 DIAGNOSIS — M542 Cervicalgia: Secondary | ICD-10-CM | POA: Diagnosis not present

## 2014-10-07 DIAGNOSIS — M9901 Segmental and somatic dysfunction of cervical region: Secondary | ICD-10-CM | POA: Diagnosis not present

## 2014-10-07 DIAGNOSIS — M4712 Other spondylosis with myelopathy, cervical region: Secondary | ICD-10-CM | POA: Diagnosis not present

## 2014-10-07 DIAGNOSIS — M5032 Other cervical disc degeneration, mid-cervical region: Secondary | ICD-10-CM | POA: Diagnosis not present

## 2014-10-11 DIAGNOSIS — M542 Cervicalgia: Secondary | ICD-10-CM | POA: Diagnosis not present

## 2014-10-11 DIAGNOSIS — M5032 Other cervical disc degeneration, mid-cervical region: Secondary | ICD-10-CM | POA: Diagnosis not present

## 2014-10-11 DIAGNOSIS — M9901 Segmental and somatic dysfunction of cervical region: Secondary | ICD-10-CM | POA: Diagnosis not present

## 2014-10-11 DIAGNOSIS — M4712 Other spondylosis with myelopathy, cervical region: Secondary | ICD-10-CM | POA: Diagnosis not present

## 2014-10-13 DIAGNOSIS — N362 Urethral caruncle: Secondary | ICD-10-CM | POA: Diagnosis not present

## 2014-10-15 DIAGNOSIS — M4712 Other spondylosis with myelopathy, cervical region: Secondary | ICD-10-CM | POA: Diagnosis not present

## 2014-10-15 DIAGNOSIS — M542 Cervicalgia: Secondary | ICD-10-CM | POA: Diagnosis not present

## 2014-10-15 DIAGNOSIS — M9901 Segmental and somatic dysfunction of cervical region: Secondary | ICD-10-CM | POA: Diagnosis not present

## 2014-10-15 DIAGNOSIS — M5032 Other cervical disc degeneration, mid-cervical region: Secondary | ICD-10-CM | POA: Diagnosis not present

## 2014-10-29 DIAGNOSIS — M542 Cervicalgia: Secondary | ICD-10-CM | POA: Diagnosis not present

## 2014-10-29 DIAGNOSIS — M5032 Other cervical disc degeneration, mid-cervical region: Secondary | ICD-10-CM | POA: Diagnosis not present

## 2014-10-29 DIAGNOSIS — M4712 Other spondylosis with myelopathy, cervical region: Secondary | ICD-10-CM | POA: Diagnosis not present

## 2014-10-29 DIAGNOSIS — M9901 Segmental and somatic dysfunction of cervical region: Secondary | ICD-10-CM | POA: Diagnosis not present

## 2014-11-09 DIAGNOSIS — M4712 Other spondylosis with myelopathy, cervical region: Secondary | ICD-10-CM | POA: Diagnosis not present

## 2014-11-09 DIAGNOSIS — M542 Cervicalgia: Secondary | ICD-10-CM | POA: Diagnosis not present

## 2014-11-09 DIAGNOSIS — M5032 Other cervical disc degeneration, mid-cervical region: Secondary | ICD-10-CM | POA: Diagnosis not present

## 2014-11-09 DIAGNOSIS — M9901 Segmental and somatic dysfunction of cervical region: Secondary | ICD-10-CM | POA: Diagnosis not present

## 2014-11-11 DIAGNOSIS — M9901 Segmental and somatic dysfunction of cervical region: Secondary | ICD-10-CM | POA: Diagnosis not present

## 2014-11-11 DIAGNOSIS — M4712 Other spondylosis with myelopathy, cervical region: Secondary | ICD-10-CM | POA: Diagnosis not present

## 2014-11-11 DIAGNOSIS — M5032 Other cervical disc degeneration, mid-cervical region: Secondary | ICD-10-CM | POA: Diagnosis not present

## 2014-11-11 DIAGNOSIS — M542 Cervicalgia: Secondary | ICD-10-CM | POA: Diagnosis not present

## 2014-11-15 DIAGNOSIS — M9901 Segmental and somatic dysfunction of cervical region: Secondary | ICD-10-CM | POA: Diagnosis not present

## 2014-11-15 DIAGNOSIS — M5032 Other cervical disc degeneration, mid-cervical region: Secondary | ICD-10-CM | POA: Diagnosis not present

## 2014-11-15 DIAGNOSIS — M4712 Other spondylosis with myelopathy, cervical region: Secondary | ICD-10-CM | POA: Diagnosis not present

## 2014-11-15 DIAGNOSIS — M542 Cervicalgia: Secondary | ICD-10-CM | POA: Diagnosis not present

## 2014-11-17 DIAGNOSIS — M5032 Other cervical disc degeneration, mid-cervical region: Secondary | ICD-10-CM | POA: Diagnosis not present

## 2014-11-17 DIAGNOSIS — M4712 Other spondylosis with myelopathy, cervical region: Secondary | ICD-10-CM | POA: Diagnosis not present

## 2014-11-17 DIAGNOSIS — M542 Cervicalgia: Secondary | ICD-10-CM | POA: Diagnosis not present

## 2014-11-17 DIAGNOSIS — M9901 Segmental and somatic dysfunction of cervical region: Secondary | ICD-10-CM | POA: Diagnosis not present

## 2014-11-23 DIAGNOSIS — M9901 Segmental and somatic dysfunction of cervical region: Secondary | ICD-10-CM | POA: Diagnosis not present

## 2014-11-23 DIAGNOSIS — M5032 Other cervical disc degeneration, mid-cervical region: Secondary | ICD-10-CM | POA: Diagnosis not present

## 2014-11-23 DIAGNOSIS — M4712 Other spondylosis with myelopathy, cervical region: Secondary | ICD-10-CM | POA: Diagnosis not present

## 2014-11-23 DIAGNOSIS — M542 Cervicalgia: Secondary | ICD-10-CM | POA: Diagnosis not present

## 2014-11-25 DIAGNOSIS — M9901 Segmental and somatic dysfunction of cervical region: Secondary | ICD-10-CM | POA: Diagnosis not present

## 2014-11-25 DIAGNOSIS — M5032 Other cervical disc degeneration, mid-cervical region: Secondary | ICD-10-CM | POA: Diagnosis not present

## 2014-11-25 DIAGNOSIS — M542 Cervicalgia: Secondary | ICD-10-CM | POA: Diagnosis not present

## 2014-11-25 DIAGNOSIS — M4712 Other spondylosis with myelopathy, cervical region: Secondary | ICD-10-CM | POA: Diagnosis not present

## 2014-11-29 DIAGNOSIS — M5032 Other cervical disc degeneration, mid-cervical region: Secondary | ICD-10-CM | POA: Diagnosis not present

## 2014-11-29 DIAGNOSIS — M4712 Other spondylosis with myelopathy, cervical region: Secondary | ICD-10-CM | POA: Diagnosis not present

## 2014-11-29 DIAGNOSIS — M9901 Segmental and somatic dysfunction of cervical region: Secondary | ICD-10-CM | POA: Diagnosis not present

## 2014-11-29 DIAGNOSIS — M542 Cervicalgia: Secondary | ICD-10-CM | POA: Diagnosis not present

## 2014-12-01 DIAGNOSIS — M4712 Other spondylosis with myelopathy, cervical region: Secondary | ICD-10-CM | POA: Diagnosis not present

## 2014-12-01 DIAGNOSIS — M5032 Other cervical disc degeneration, mid-cervical region: Secondary | ICD-10-CM | POA: Diagnosis not present

## 2014-12-01 DIAGNOSIS — M542 Cervicalgia: Secondary | ICD-10-CM | POA: Diagnosis not present

## 2014-12-01 DIAGNOSIS — M9901 Segmental and somatic dysfunction of cervical region: Secondary | ICD-10-CM | POA: Diagnosis not present

## 2014-12-07 DIAGNOSIS — E039 Hypothyroidism, unspecified: Secondary | ICD-10-CM | POA: Diagnosis not present

## 2014-12-07 DIAGNOSIS — Z1389 Encounter for screening for other disorder: Secondary | ICD-10-CM | POA: Diagnosis not present

## 2014-12-07 DIAGNOSIS — Z79899 Other long term (current) drug therapy: Secondary | ICD-10-CM | POA: Diagnosis not present

## 2014-12-07 DIAGNOSIS — I10 Essential (primary) hypertension: Secondary | ICD-10-CM | POA: Diagnosis not present

## 2014-12-07 DIAGNOSIS — Z0000 Encounter for general adult medical examination without abnormal findings: Secondary | ICD-10-CM | POA: Diagnosis not present

## 2014-12-08 DIAGNOSIS — M5032 Other cervical disc degeneration, mid-cervical region: Secondary | ICD-10-CM | POA: Diagnosis not present

## 2014-12-08 DIAGNOSIS — M4712 Other spondylosis with myelopathy, cervical region: Secondary | ICD-10-CM | POA: Diagnosis not present

## 2014-12-08 DIAGNOSIS — M9901 Segmental and somatic dysfunction of cervical region: Secondary | ICD-10-CM | POA: Diagnosis not present

## 2014-12-08 DIAGNOSIS — M542 Cervicalgia: Secondary | ICD-10-CM | POA: Diagnosis not present

## 2014-12-09 ENCOUNTER — Ambulatory Visit (INDEPENDENT_AMBULATORY_CARE_PROVIDER_SITE_OTHER): Payer: Medicare Other | Admitting: Emergency Medicine

## 2014-12-09 VITALS — BP 130/80 | HR 91 | Temp 98.1°F | Resp 18 | Ht 61.0 in | Wt 119.0 lb

## 2014-12-09 DIAGNOSIS — M79605 Pain in left leg: Secondary | ICD-10-CM | POA: Diagnosis not present

## 2014-12-09 DIAGNOSIS — S81812A Laceration without foreign body, left lower leg, initial encounter: Secondary | ICD-10-CM | POA: Diagnosis not present

## 2014-12-09 NOTE — Progress Notes (Signed)
Procedure:  Consent obtained.  Local anesthesia with 2% lido with epi.  Cleaned wound.  Sterile drape placed.  5-0 Ethilon #1 horizontal suture placed.  Drsg placed.  Wound care d/w pt.

## 2014-12-09 NOTE — Progress Notes (Signed)
Subjective:  Patient ID: Breanna [name], female    DOB: 1930-07-20  Age: 81 y.o. MRN: [id number]  CC: Fall   HPI Breanna [name] presents  with an injury to her shin following a fall on the steps. She denies syncope or past out. Did not hit her head. Has a laceration on her leg. No other complaint of injury. She's currently on tetanus having received tetanus immunization last 2 days as any improvement with over-the-counter medication. This wound occurred today.  History Breanna [name] has a past medical history of Hypertension; Osteoporosis, senile (08/2013); Hypothyroidism; Vertigo (Nov 2013); and Stroke.   She has past surgical history that includes Back surgery (2006); Tonsillectomy; Tubal ligation; Vaginal hysterectomy (N/A, 08/19/2012); Salpingoophorectomy (Bilateral, 08/19/2012); Cystoscopy (N/A, 08/19/2012); Cystocele repair (N/A, 08/19/2012); and Vaginal prolapse repair (N/A, 08/19/2012).   Her  family history includes Cancer - Colon in her mother; Stroke in her father.  She   reports that she has never smoked. She has never used smokeless tobacco. She reports that she drinks alcohol. She reports that she does not use illicit drugs.  Outpatient Prescriptions Prior to Visit  Medication Sig Dispense Refill  . acetaminophen (TYLENOL) 500 MG tablet Take 1,000 mg by mouth every 6 (six) hours as needed for mild pain.    Marland Kitchen aspirin EC 81 MG tablet Take 81 mg by mouth daily.    . Calcium-Vitamin D (CALTRATE 600 PLUS-VIT D PO) Take 1 tablet by mouth 2 (two) times daily.    . Cholecalciferol 1000 UNITS tablet Take 1,000 Units by mouth daily.    . DOCOSAHEXAENOIC ACID PO Take 1 tablet by mouth 2 (two) times daily.    Marland Kitchen doxycycline (VIBRAMYCIN) 100 MG capsule Take 1 capsule (100 mg total) by mouth 2 (two) times daily. 14 capsule 0  . felodipine (PLENDIL) 10 MG 24 hr tablet Take 5 mg by mouth daily.     Marland Kitchen levothyroxine (SYNTHROID, LEVOTHROID) 88 MCG tablet Take 88 mcg by mouth daily before  breakfast.    . Multiple Vitamin (MULTIVITAMIN WITH MINERALS) TABS Take 1 tablet by mouth daily. Macular Protect.    . Multiple Vitamins-Minerals (MH MACULAR HEALTH) MISC Take 1 tablet by mouth daily.    Breanna Faster Glycol-Propyl Glycol (SYSTANE OP) Apply 2 drops to eye daily as needed (for dry eyes.).    Marland Kitchen polyethylene glycol (MIRALAX / GLYCOLAX) packet Take 17 g by mouth daily.     No facility-administered medications prior to visit.    Social History   Social History  . Marital Status: Married    Spouse Name: N/A  . Number of Children: N/A  . Years of Education: N/A   Social History Main Topics  . Smoking status: Never Smoker   . Smokeless tobacco: Never Used  . Alcohol Use: 0.0 oz/week    0 Standard drinks or equivalent per week     Comment: socially drinks wine  . Drug Use: No  . Sexual Activity: Not Currently     Comment: 1st intercourse 23 yo-1 partner   Other Topics Concern  . None   Social History Narrative     Review of Systems  Constitutional: Negative for fever, chills and appetite change.  HENT: Negative for congestion, ear pain, postnasal drip, sinus pressure and sore throat.   Eyes: Negative for pain and redness.  Respiratory: Negative for cough, shortness of breath and wheezing.   Cardiovascular: Negative for leg swelling.  Gastrointestinal: Negative for nausea, vomiting, abdominal pain, diarrhea, constipation and blood  in stool.  Endocrine: Negative for polyuria.  Genitourinary: Negative for dysuria, urgency, frequency and flank pain.  Musculoskeletal: Negative for gait problem.  Skin: Negative for rash.  Neurological: Negative for weakness and headaches.  Psychiatric/Behavioral: Negative for confusion and decreased concentration. The patient is not nervous/anxious.     Objective:  BP 130/80 mmHg  Pulse 91  Temp(Src) 98.1 F (36.7 C) (Oral)  Resp 18  Ht 5\' 1"  (1.549 m)  Wt 119 lb (53.978 kg)  BMI 22.50 kg/m2  SpO2 98%  Physical Exam    Constitutional: She is oriented to person, place, and time. She appears well-developed and well-nourished. No distress.  HENT:  Head: Normocephalic and atraumatic.  Right Ear: External ear normal.  Left Ear: External ear normal.  Nose: Nose normal.  Eyes: Conjunctivae and EOM are normal. Pupils are equal, round, and reactive to light. No scleral icterus.  Neck: Normal range of motion. Neck supple. No tracheal deviation present.  Cardiovascular: Normal rate, regular rhythm and normal heart sounds.   Pulmonary/Chest: Effort normal. No respiratory distress. She has no wheezes. She has no rales.  Abdominal: She exhibits no mass. There is no tenderness. There is no rebound and no guarding.  Musculoskeletal: She exhibits no edema.  Lymphadenopathy:    She has no cervical adenopathy.  Neurological: She is alert and oriented to person, place, and time. Coordination normal.  Skin: Skin is warm and dry. Laceration noted. No rash noted.  Psychiatric: She has a normal mood and affect. Her behavior is normal.      Assessment & Plan:   There are no diagnoses linked to this encounter. I am having Breanna [name] maintain her felodipine, multivitamin with minerals, aspirin EC, Polyethyl Glycol-Propyl Glycol (SYSTANE OP), levothyroxine, Calcium-Vitamin D (CALTRATE 600 PLUS-VIT D PO), Cholecalciferol, MH MACULAR HEALTH, polyethylene glycol, DOCOSAHEXAENOIC ACID PO, acetaminophen, and doxycycline.  No orders of the defined types were placed in this encounter.    Appropriate red flag conditions were discussed with the patient as well as actions that should be taken.  Patient expressed his understanding.  Follow-up: Return in about 1 week (around 12/16/2014).  Roselee Culver, MD

## 2014-12-09 NOTE — Patient Instructions (Signed)

## 2014-12-15 ENCOUNTER — Ambulatory Visit (INDEPENDENT_AMBULATORY_CARE_PROVIDER_SITE_OTHER): Payer: Medicare Other | Admitting: Emergency Medicine

## 2014-12-15 VITALS — BP 122/62 | HR 68 | Temp 97.6°F | Resp 18 | Ht 61.0 in | Wt 119.0 lb

## 2014-12-15 DIAGNOSIS — L03116 Cellulitis of left lower limb: Secondary | ICD-10-CM | POA: Diagnosis not present

## 2014-12-15 MED ORDER — DOXYCYCLINE HYCLATE 100 MG PO CAPS: 100.0000 mg | ORAL_CAPSULE | Freq: Two times a day (BID) | ORAL | Status: DC

## 2014-12-15 NOTE — Progress Notes (Signed)
Subjective:  Patient ID: Breanna [name], female    DOB: Jun 02, 1930  Age: 81 y.o. MRN: [id number]  CC: Suture / Staple Removal   HPI Breanna [name] presents  patient had a laceration on her left lower leg repaired on 12/09/2014. Since that time the wound is becoming infected and is draining. She has no fever or chills. She is ambulating and without difficulty and is getting around with no trouble.  History Breanna [name] has a past medical history of Hypertension; Osteoporosis, senile (08/2013); Hypothyroidism; Vertigo (Nov 2013); and Stroke.   She has past surgical history that includes Back surgery (2006); Tonsillectomy; Tubal ligation; Vaginal hysterectomy (N/A, 08/19/2012); Salpingoophorectomy (Bilateral, 08/19/2012); Cystoscopy (N/A, 08/19/2012); Cystocele repair (N/A, 08/19/2012); and Vaginal prolapse repair (N/A, 08/19/2012).   Her  family history includes Cancer - Colon in her mother; Stroke in her father.  She   reports that she has never smoked. She has never used smokeless tobacco. She reports that she drinks alcohol. She reports that she does not use illicit drugs.  Outpatient Prescriptions Prior to Visit  Medication Sig Dispense Refill  . acetaminophen (TYLENOL) 500 MG tablet Take 1,000 mg by mouth every 6 (six) hours as needed for mild pain.    Marland Kitchen aspirin EC 81 MG tablet Take 81 mg by mouth daily.    . Calcium-Vitamin D (CALTRATE 600 PLUS-VIT D PO) Take 1 tablet by mouth 2 (two) times daily.    . Cholecalciferol 1000 UNITS tablet Take 1,000 Units by mouth daily.    . DOCOSAHEXAENOIC ACID PO Take 1 tablet by mouth 2 (two) times daily.    Marland Kitchen doxycycline (VIBRAMYCIN) 100 MG capsule Take 1 capsule (100 mg total) by mouth 2 (two) times daily. 14 capsule 0  . felodipine (PLENDIL) 10 MG 24 hr tablet Take 5 mg by mouth daily.     Marland Kitchen levothyroxine (SYNTHROID, LEVOTHROID) 88 MCG tablet Take 88 mcg by mouth daily before breakfast.    . Multiple Vitamin (MULTIVITAMIN WITH MINERALS) TABS  Take 1 tablet by mouth daily. Macular Protect.    . Multiple Vitamins-Minerals (MH MACULAR HEALTH) MISC Take 1 tablet by mouth daily.    Breanna [name] Glycol-Propyl Glycol (SYSTANE OP) Apply 2 drops to eye daily as needed (for dry eyes.).    Marland Kitchen polyethylene glycol (MIRALAX / GLYCOLAX) packet Take 17 g by mouth daily.     No facility-administered medications prior to visit.    Social History   Social History  . Marital Status: Married    Spouse Name: N/A  . Number of Children: N/A  . Years of Education: N/A   Social History Main Topics  . Smoking status: Never Smoker   . Smokeless tobacco: Never Used  . Alcohol Use: 0.0 oz/week    0 Standard drinks or equivalent per week     Comment: socially drinks wine  . Drug Use: No  . Sexual Activity: Not Currently     Comment: 1st intercourse 23 yo-1 partner   Other Topics Concern  . None   Social History Narrative     Review of Systems  Constitutional: Negative for fever, chills and appetite change.  HENT: Negative for congestion, ear pain, postnasal drip, sinus pressure and sore throat.   Eyes: Negative for pain and redness.  Respiratory: Negative for cough, shortness of breath and wheezing.   Cardiovascular: Negative for leg swelling.  Gastrointestinal: Negative for nausea, vomiting, abdominal pain, diarrhea, constipation and blood in stool.  Endocrine: Negative for polyuria.  Genitourinary: Negative  for dysuria, urgency, frequency and flank pain.  Musculoskeletal: Negative for gait problem.  Skin: Negative for rash.  Neurological: Negative for weakness and headaches.  Psychiatric/Behavioral: Negative for confusion and decreased concentration. The patient is not nervous/anxious.     Objective:  BP 122/62 mmHg  Pulse 68  Temp(Src) 97.6 F (36.4 C) (Oral)  Resp 18  Ht 5\' 1"  (1.549 m)  Wt 119 lb (53.978 kg)  BMI 22.50 kg/m2  SpO2 99%  Physical Exam  Constitutional: She is oriented to person, place, and time. She appears  well-developed and well-nourished.  HENT:  Head: Normocephalic and atraumatic.  Eyes: Conjunctivae are normal. Pupils are equal, round, and reactive to light.  Pulmonary/Chest: Effort normal.  Musculoskeletal: She exhibits no edema.  Neurological: She is alert and oriented to person, place, and time.  Skin: Skin is dry. Laceration noted.  She has a wound that's draining of seropurulent material there is no pus visible. She has very limited cellulitis and limited tenderness surrounding the wound. She has a generalized ecchymosis  Psychiatric: She has a normal mood and affect. Her behavior is normal. Thought content normal.      Assessment & Plan:   Kadey was seen today for suture / staple removal.  Diagnoses and all orders for this visit:  Cellulitis of left lower extremity  Other orders -     doxycycline (VIBRAMYCIN) 100 MG capsule; Take 1 capsule (100 mg total) by mouth 2 (two) times daily.  I am having Breanna [name] start on doxycycline. I am also having her maintain her felodipine, multivitamin with minerals, aspirin EC, Polyethyl Glycol-Propyl Glycol (SYSTANE OP), levothyroxine, Calcium-Vitamin D (CALTRATE 600 PLUS-VIT D PO), Cholecalciferol, MH MACULAR HEALTH, polyethylene glycol, DOCOSAHEXAENOIC ACID PO, acetaminophen, and doxycycline.  Meds ordered this encounter  Medications  . doxycycline (VIBRAMYCIN) 100 MG capsule    Sig: Take 1 capsule (100 mg total) by mouth 2 (two) times daily.    Dispense:  20 capsule    Refill:  0    Appropriate red flag conditions were discussed with the patient as well as actions that should be taken.  Patient expressed his understanding.  Follow-up: Return in about 1 week (around 12/22/2014).  Roselee Culver, MD

## 2014-12-15 NOTE — Patient Instructions (Signed)

## 2014-12-26 ENCOUNTER — Ambulatory Visit (INDEPENDENT_AMBULATORY_CARE_PROVIDER_SITE_OTHER): Payer: Medicare Other | Admitting: Physician Assistant

## 2014-12-26 VITALS — BP 124/60 | HR 71 | Temp 97.9°F | Resp 17 | Ht 61.0 in | Wt 119.0 lb

## 2014-12-26 DIAGNOSIS — S81812D Laceration without foreign body, left lower leg, subsequent encounter: Secondary | ICD-10-CM | POA: Diagnosis not present

## 2014-12-26 NOTE — Progress Notes (Signed)
[facility] [address], [place] [address] [phone]  Date:  12/26/2014   Name:  Breanna [name]   DOB:  08-03-1930   MRN:  [id number]  PCP:  Mathews Argyle, MD    Chief Complaint: Suture / Staple Removal   History of Present Illness:  This is a 81 y.o. female who is presenting for suture removal. She was first seen on 9/8 for laceration repair. Laceration located to left leg below knee. #1 suture was placed. She returned on 9/14 d/t infected wound. She was placed on doxycycline and suture was left in place. She finished doxy 1 day ago. She states the wound is doing much better. No longer draining and no pain, fever or chills.  Review of Systems:  Review of Systems See HPI  Patient Active Problem List   Diagnosis Date Noted  . Cervical spinal stenosis 02/18/2012  . Cerebrovascular disease 02/18/2012  . Vertigo 02/17/2012  . Hypertension 02/17/2012  . Hypothyroid 02/17/2012  . H/O osteoporosis 02/17/2012    Prior to Admission medications   Medication Sig Start Date End Date Taking? Authorizing Provider  acetaminophen (TYLENOL) 500 MG tablet Take 1,000 mg by mouth every 6 (six) hours as needed for mild pain.   Yes Historical Provider, MD  aspirin EC 81 MG tablet Take 81 mg by mouth daily.   Yes Historical Provider, MD  Calcium-Vitamin D (CALTRATE 600 PLUS-VIT D PO) Take 1 tablet by mouth 2 (two) times daily.   Yes Historical Provider, MD  Cholecalciferol 1000 UNITS tablet Take 1,000 Units by mouth daily.   Yes Historical Provider, MD  DOCOSAHEXAENOIC ACID PO Take 1 tablet by mouth 2 (two) times daily.   Yes Historical Provider, MD  doxycycline (VIBRAMYCIN) 100 MG capsule Take 1 capsule (100 mg total) by mouth 2 (two) times daily. 07/05/14  Yes Anastasio Auerbach, MD  doxycycline (VIBRAMYCIN) 100 MG capsule Take 1 capsule (100 mg total) by mouth 2 (two) times daily. 12/15/14  Yes Roselee Culver, MD  felodipine (PLENDIL) 10 MG 24 hr tablet  Take 5 mg by mouth daily.    Yes Historical Provider, MD  levothyroxine (SYNTHROID, LEVOTHROID) 88 MCG tablet Take 88 mcg by mouth daily before breakfast.   Yes Historical Provider, MD  Multiple Vitamin (MULTIVITAMIN WITH MINERALS) TABS Take 1 tablet by mouth daily. Macular Protect.   Yes Historical Provider, MD  Multiple Vitamins-Minerals ([place]) MISC Take 1 tablet by mouth daily.   Yes Historical Provider, MD  Polyethyl Glycol-Propyl Glycol (SYSTANE OP) Apply 2 drops to [name] daily as needed (for dry eyes.).   Yes Historical Provider, MD  polyethylene glycol (MIRALAX / GLYCOLAX) packet Take 17 g by mouth daily.   Yes Historical Provider, MD    Allergies  Allergen Reactions  . Anesthetics, Amide Nausea And Vomiting    Also causes constipation.   Marland Kitchen Percocet [Oxycodone-Acetaminophen] Nausea And Vomiting  . Sulfa Antibiotics Nausea And Vomiting    Past Surgical History  Procedure Laterality Date  . Back surgery  2006  . Tonsillectomy    . Tubal ligation    . Vaginal hysterectomy N/A 08/19/2012    Procedure: HYSTERECTOMY VAGINAL;  Surgeon: Marvene Staff, MD;  Location: [facility];  Service: Gynecology;  Laterality: N/A;  . Salpingoophorectomy Bilateral 08/19/2012    Procedure: SALPINGO OOPHORECTOMY;  Surgeon: Marvene Staff, MD;  Location: [facility];  Service: Gynecology;  Laterality: Bilateral;  . Cystoscopy N/A 08/19/2012    Procedure:  CYSTOSCOPY;  Surgeon: Reece Packer, MD;  Location: [facility];  Service: Urology;  Laterality: N/A;  . Cystocele repair N/A 08/19/2012    Procedure: ANTERIOR REPAIR (CYSTOCELE);  Surgeon: Reece Packer, MD;  Location: [facility];  Service: Urology;  Laterality: N/A;  . Vaginal prolapse repair N/A 08/19/2012    Procedure: VAULT PROLAPSE WITH GRAFT;  Surgeon: Reece Packer, MD;  Location: [facility];  Service: Urology;  Laterality: N/A;    Social History  Substance Use Topics  . Smoking status: Never Smoker   . Smokeless tobacco: Never  Used  . Alcohol Use: 0.0 oz/week    0 Standard drinks or equivalent per week     Comment: socially drinks wine    Family History  Problem Relation Age of Onset  . Stroke Father   . Cancer - Colon Mother     Medication list has been reviewed and updated.  Physical Examination:  Physical Exam  Constitutional: She is oriented to person, place, and time. She appears well-developed and well-nourished. No distress.  HENT:  Head: Normocephalic and atraumatic.  Right Ear: Hearing normal.  Left Ear: Hearing normal.  Nose: Nose normal.  Eyes: Conjunctivae and lids are normal. Right [name] exhibits no discharge. Left [name] exhibits no discharge. No scleral icterus.  Pulmonary/Chest: Effort normal. No respiratory distress.  Musculoskeletal: Normal range of motion.  Neurological: She is alert and oriented to person, place, and time.  Skin: Skin is warm, dry and intact.  Small healing laceration on left leg under knee. No drainage or erythema. Not ttp. #1 suture removed.  Psychiatric: She has a normal mood and affect. Her speech is normal and behavior is normal. Thought content normal.   BP 124/60 mmHg  Pulse 71  Temp(Src) 97.9 F (36.6 C) (Oral)  Resp 17  Ht 5\' 1"  (1.549 m)  Wt 119 lb (53.978 kg)  BMI 22.50 kg/m2  SpO2 99%  Assessment and Plan:  1. Laceration of leg, left, subsequent encounter Wound healing well. #1 suture removed. No further follow up needed.   Benjaman Pott Drenda Freeze, MHS [facility]  12/26/2014

## 2015-01-17 DIAGNOSIS — N362 Urethral caruncle: Secondary | ICD-10-CM | POA: Diagnosis not present

## 2015-01-18 DIAGNOSIS — L648 Other androgenic alopecia: Secondary | ICD-10-CM | POA: Diagnosis not present

## 2015-01-18 DIAGNOSIS — L218 Other seborrheic dermatitis: Secondary | ICD-10-CM | POA: Diagnosis not present

## 2015-01-18 DIAGNOSIS — L821 Other seborrheic keratosis: Secondary | ICD-10-CM | POA: Diagnosis not present

## 2015-02-07 ENCOUNTER — Ambulatory Visit (INDEPENDENT_AMBULATORY_CARE_PROVIDER_SITE_OTHER): Payer: Medicare Other | Admitting: Ophthalmology

## 2015-02-07 DIAGNOSIS — H353132 Nonexudative age-related macular degeneration, bilateral, intermediate dry stage: Secondary | ICD-10-CM

## 2015-02-07 DIAGNOSIS — H35033 Hypertensive retinopathy, bilateral: Secondary | ICD-10-CM

## 2015-02-07 DIAGNOSIS — I10 Essential (primary) hypertension: Secondary | ICD-10-CM | POA: Diagnosis not present

## 2015-02-07 DIAGNOSIS — H43813 Vitreous degeneration, bilateral: Secondary | ICD-10-CM

## 2015-02-28 ENCOUNTER — Ambulatory Visit (INDEPENDENT_AMBULATORY_CARE_PROVIDER_SITE_OTHER): Payer: Medicare Other | Admitting: Ophthalmology

## 2015-03-15 ENCOUNTER — Ambulatory Visit (INDEPENDENT_AMBULATORY_CARE_PROVIDER_SITE_OTHER): Payer: Medicare Other | Admitting: Ophthalmology

## 2015-04-06 DIAGNOSIS — E039 Hypothyroidism, unspecified: Secondary | ICD-10-CM | POA: Diagnosis not present

## 2015-04-13 DIAGNOSIS — R159 Full incontinence of feces: Secondary | ICD-10-CM | POA: Diagnosis not present

## 2015-04-13 DIAGNOSIS — N362 Urethral caruncle: Secondary | ICD-10-CM | POA: Diagnosis not present

## 2015-05-12 DIAGNOSIS — L821 Other seborrheic keratosis: Secondary | ICD-10-CM | POA: Diagnosis not present

## 2015-05-12 DIAGNOSIS — L570 Actinic keratosis: Secondary | ICD-10-CM | POA: Diagnosis not present

## 2015-05-23 ENCOUNTER — Encounter: Payer: Self-pay | Admitting: Gynecology

## 2015-05-23 ENCOUNTER — Ambulatory Visit (INDEPENDENT_AMBULATORY_CARE_PROVIDER_SITE_OTHER): Payer: Medicare Other | Admitting: Gynecology

## 2015-05-23 VITALS — BP 112/60 | Ht 61.0 in | Wt 121.0 lb

## 2015-05-23 DIAGNOSIS — N816 Rectocele: Secondary | ICD-10-CM | POA: Diagnosis not present

## 2015-05-23 DIAGNOSIS — Z01419 Encounter for gynecological examination (general) (routine) without abnormal findings: Secondary | ICD-10-CM | POA: Diagnosis not present

## 2015-05-23 DIAGNOSIS — N952 Postmenopausal atrophic vaginitis: Secondary | ICD-10-CM

## 2015-05-23 DIAGNOSIS — M810 Age-related osteoporosis without current pathological fracture: Secondary | ICD-10-CM | POA: Diagnosis not present

## 2015-05-23 NOTE — Addendum Note (Signed)
Addended by: Joaquin Music on: 05/23/2015 12:43 PM   Modules accepted: Orders

## 2015-05-23 NOTE — Patient Instructions (Signed)
Follow-up for the bone density as scheduled. 

## 2015-05-23 NOTE — Progress Notes (Signed)
JAMILE WASKO [birth year] [id number]        82 y.o.  G4P4  for breast and pelvic exam. Several issues noted below.  Past medical history,surgical history, problem list, medications, allergies, family history and social history were all reviewed and documented as reviewed in the EPIC chart.  ROS:  Performed with pertinent positives and negatives included in the history, assessment and plan.   Additional significant findings :  none   Exam: [name] assistant Filed Vitals:   05/23/15 1002  BP: 112/60  Height: 5\' 1"  (1.549 m)  Weight: 121 lb (54.885 kg)   General appearance:  Normal affect, orientation and appearance. Skin: Grossly normal HEENT: Without gross lesions.  No cervical or supraclavicular adenopathy. Thyroid normal.  Lungs:  Clear without wheezing, rales or rhonchi Cardiac: RR, without RMG Abdominal:  Soft, nontender, without masses, guarding, rebound, organomegaly or hernia Breasts:  Examined lying and sitting without masses, retractions, discharge or axillary adenopathy.  Left nipple dimpled as always Pelvic:  Ext/BUS/vagina with atrophic changes. First-degree rectocele noted. Cuff well supported.  Adnexa without masses or tenderness    Anus and perineum normal   Rectovaginal normal sphincter tone without palpated masses or tenderness.    Assessment/Plan:  82 y.o. G22P4 female breast and pelvic exam.   1. Post menopausal/atrophic genital changes. Without significant hot flashes, night sweats, vaginal dryness. Continue to monitor and report any issues. 2. History of TVH/BSO/anterior colporrhaphy/vaginal vault graft suspension. Subsequent robotic sacral colpopexy for large rectocele. Does have small rectocele now but otherwise cuff well supported. Asymptomatic to the patient. Continue to monitor. 3. Osteoporosis. DEXA 08/2013 T score -2.8. Stable from prior DEXA. Had been on Actonel and Reclast close to 14 years. Discontinued 2015. Repeat DEXA after June this year at  a 2 year interval. 4. Mammography 08/2014. Continue with annual mammography when due. SBE monthly reviewed. 5. Pap smear 2011. No Pap smear done today. We both agree to stop screening per current screening guidelines based on hysterectomy history and age. 6. Colonoscopy 5-6 years ago. Repeat at their recommended interval. 7. Health maintenance. No routine blood work done as patient does this at her primary physician's office. Follow up 1 year, sooner as needed.   Anastasio Auerbach MD, 10:31 AM 05/23/2015

## 2015-08-26 DIAGNOSIS — Z1231 Encounter for screening mammogram for malignant neoplasm of breast: Secondary | ICD-10-CM | POA: Diagnosis not present

## 2015-09-09 ENCOUNTER — Encounter: Payer: Self-pay | Admitting: Gynecology

## 2015-09-23 DIAGNOSIS — M62838 Other muscle spasm: Secondary | ICD-10-CM | POA: Diagnosis not present

## 2015-10-06 DIAGNOSIS — E039 Hypothyroidism, unspecified: Secondary | ICD-10-CM | POA: Diagnosis not present

## 2015-10-27 DIAGNOSIS — Z1211 Encounter for screening for malignant neoplasm of colon: Secondary | ICD-10-CM | POA: Diagnosis not present

## 2015-11-08 ENCOUNTER — Ambulatory Visit (INDEPENDENT_AMBULATORY_CARE_PROVIDER_SITE_OTHER): Payer: Medicare Other

## 2015-11-08 DIAGNOSIS — M810 Age-related osteoporosis without current pathological fracture: Secondary | ICD-10-CM | POA: Diagnosis not present

## 2015-11-09 ENCOUNTER — Telehealth: Payer: Self-pay | Admitting: Gynecology

## 2015-11-09 DIAGNOSIS — M810 Age-related osteoporosis without current pathological fracture: Secondary | ICD-10-CM

## 2015-11-09 NOTE — Telephone Encounter (Signed)
Tell patient that her bone density shows osteoporosis with some loss from her prior study. I recommend patient follow up with Dr. Benjiman Core in consultation as she is an expert in this area and given the patient's prior treatments I think her input would be valuable.

## 2015-11-09 NOTE — Telephone Encounter (Signed)
Left message for pt to call.

## 2015-11-10 NOTE — Telephone Encounter (Signed)
Pt informed with the below note, referral placed. They will contact pt to schedule.

## 2015-11-14 DIAGNOSIS — M9901 Segmental and somatic dysfunction of cervical region: Secondary | ICD-10-CM | POA: Diagnosis not present

## 2015-11-14 DIAGNOSIS — M546 Pain in thoracic spine: Secondary | ICD-10-CM | POA: Diagnosis not present

## 2015-11-14 DIAGNOSIS — M542 Cervicalgia: Secondary | ICD-10-CM | POA: Diagnosis not present

## 2015-11-14 DIAGNOSIS — M9902 Segmental and somatic dysfunction of thoracic region: Secondary | ICD-10-CM | POA: Diagnosis not present

## 2015-11-16 ENCOUNTER — Ambulatory Visit: Payer: Medicare Other | Admitting: Internal Medicine

## 2015-11-16 DIAGNOSIS — M9902 Segmental and somatic dysfunction of thoracic region: Secondary | ICD-10-CM | POA: Diagnosis not present

## 2015-11-16 DIAGNOSIS — M542 Cervicalgia: Secondary | ICD-10-CM | POA: Diagnosis not present

## 2015-11-16 DIAGNOSIS — H903 Sensorineural hearing loss, bilateral: Secondary | ICD-10-CM | POA: Diagnosis not present

## 2015-11-16 DIAGNOSIS — M546 Pain in thoracic spine: Secondary | ICD-10-CM | POA: Diagnosis not present

## 2015-11-16 DIAGNOSIS — M9901 Segmental and somatic dysfunction of cervical region: Secondary | ICD-10-CM | POA: Diagnosis not present

## 2015-11-16 NOTE — Telephone Encounter (Signed)
appt 11/17/15 @ 3:00pm

## 2015-11-17 ENCOUNTER — Ambulatory Visit (INDEPENDENT_AMBULATORY_CARE_PROVIDER_SITE_OTHER): Payer: Medicare Other | Admitting: Internal Medicine

## 2015-11-17 VITALS — BP 118/58 | HR 88 | Ht 61.0 in | Wt 120.0 lb

## 2015-11-17 DIAGNOSIS — M810 Age-related osteoporosis without current pathological fracture: Secondary | ICD-10-CM

## 2015-11-17 LAB — BASIC METABOLIC PANEL WITH GFR
BUN: 23 mg/dL (ref 7–25)
CO2: 23 mmol/L (ref 20–31)
Calcium: 9.4 mg/dL (ref 8.6–10.4)
Chloride: 103 mmol/L (ref 98–110)
Creat: 0.8 mg/dL (ref 0.60–0.88)
GFR, EST AFRICAN AMERICAN: 79 mL/min (ref >=60)
GFR, Est Non African American: 68 mL/min (ref >=60)
Glucose, Bld: 114 mg/dL — ABNORMAL HIGH (ref 65–99)
POTASSIUM: 4.6 mmol/L (ref 3.5–5.3)
SODIUM: 136 mmol/L (ref 135–146)

## 2015-11-17 LAB — VITAMIN D 25 HYDROXY (VIT D DEFICIENCY, FRACTURES): VITD: 82.73 ng/mL (ref 30.00–100.00)

## 2015-11-17 NOTE — Patient Instructions (Signed)
Please stop at the lab.  Please continue the 5000 units of vitamin D and the 600 mg 2x daily.  We will start a Prolia preauthorization. We will let you know about the schedule.  Please return in 1 year.  How Can I Prevent Falls? Men and women with osteoporosis need to take care not to fall down. Falls can break bones. Some reasons people fall are: Poor vision  Poor balance  Certain diseases that affect how you walk  Some types of medicine, such as sleeping pills.  Some tips to help prevent falls outdoors are: Use a cane or walker  Wear rubber-soled shoes so you don't slip  Walk on grass when sidewalks are slippery  In winter, put salt or kitty litter on icy sidewalks.  Some ways to help prevent falls indoors are: Keep rooms free of clutter, especially on floors  Use plastic or carpet runners on slippery floors  Wear low-heeled shoes that provide good support  Do not walk in socks, stockings, or slippers  Be sure carpets and area rugs have skid-proof backs or are tacked to the floor  Be sure stairs are well lit and have rails on both sides  Put grab bars on bathroom walls near tub, shower, and toilet  Use a rubber bath mat in the shower or tub  Keep a flashlight next to your bed  Use a sturdy step stool with a handrail and wide steps  Add more lights in rooms (and night lights) Buy a cordless phone to keep with you so that you don't have to rush to the phone       when it rings and so that you can call for help if you fall.   (adapted from http://www.niams.NightlifePreviews.se)  Exercise for Strong Bones (from [place]) There are two types of exercises that are important for building and maintaining bone density:  weight-bearing and muscle-strengthening exercises. Weight-bearing Exercises These exercises include activities that make you move against gravity while staying upright. Weight-bearing exercises can be  high-impact or low-impact. High-impact weight-bearing exercises help build bones and keep them strong. If you have broken a bone due to osteoporosis or are at risk of breaking a bone, you may need to avoid high-impact exercises. If youre not sure, you should check with your healthcare provider. Examples of high-impact weight-bearing exercises are:  Dancing  Doing high-impact aerobics  Hiking  Jogging/running  Jumping Rope  Stair climbing  Tennis Low-impact weight-bearing exercises can also help keep bones strong and are a safe alternative if you cannot do high-impact exercises. Examples of low-impact weight-bearing exercises are:  Using elliptical training machines  Doing low-impact aerobics  Using stair-step machines  Fast walking on a treadmill or outside Muscle-Strengthening Exercises These exercises include activities where you move your body, a weight or some other resistance against gravity. They are also known as resistance exercises and include:  Lifting weights  Using elastic exercise bands  Using weight machines  Lifting your own body weight  Functional movements, such as standing and rising up on your toes Yoga and Pilates can also improve strength, balance and flexibility. However, certain positions may not be safe for people with osteoporosis or those at increased risk of broken bones. For example, exercises that have you bend forward may increase the chance of breaking a bone in the spine. A physical therapist should be able to help you learn which exercises are safe and appropriate for you. Non-Impact Exercises Non-impact exercises can help you to improve  balance, posture and how well you move in everyday activities. These exercises can also help to increase muscle strength and decrease the risk of falls and broken bones. Some of these exercises include:  Balance exercises that strengthen your legs and test your balance, such as Tai Chi, can decrease your  risk of falls.  Posture exercises that improve your posture and reduce rounded or sloping shoulders can help you decrease the chance of breaking a bone, especially in the spine.  Functional exercises that improve how well you move can help you with everyday activities and decrease your chance of falling and breaking a bone. For example, if you have trouble getting up from a chair or climbing stairs, you should do these activities as exercises. A physical therapist can teach you balance, posture and functional exercises. Starting a New Exercise Program If you havent exercised regularly for a while, check with your healthcare provider before beginning a new exercise program--particularly if you have health problems such as heart disease, diabetes or high blood pressure. If youre at high risk of breaking a bone, you should work with a physical therapist to develop a safe exercise program. Once you have your healthcare providers approval, start slowly. If youve already broken bones in the spine because of osteoporosis, be very careful to avoid activities that require reaching down, bending forward, rapid twisting motions, heavy lifting and those that increase your chance of a fall. As you get started, your muscles may feel sore for a day or two after you exercise. If soreness lasts longer, you may be working too hard and need to ease up. Exercises should be done in a pain-free range of motion. How Much Exercise Do You Need? Weight-bearing exercises 30 minutes on most days of the week. Do a 30-minutesession or multiple sessions spread out throughout the day. The benefits to your bones are the same.   Muscle-strengthening exercises Two to three days per week. If you dont have much time for strengthening/resistance training, do small amounts at a time. You can do just one body part each day. For example do arms one day, legs the next and trunk the next. You can also spread these exercises out during your  normal day.  Balance, posture and functional exercises Every day or as often as needed. You may want to focus on one area more than the others. If you have fallen or lose your balance, spend time doing balance exercises. If you are getting rounded shoulders, work more on posture exercises. If you have trouble climbing stairs or getting up from the couch, do more functional exercises. You can also perform these exercises at one time or spread them during your day. Work with a phyiscal therapist to learn the right exercises for you.    Denosumab: Patient drug information (Up-to-date) Copyright [phone] [place] rights reserved.  Brand Names: [place]  ProliaDelton See What is this drug used for?  It is used to treat soft, brittle bones (osteoporosis).  It is used for bone growth.  It is used when treating some cancers.  It may be given to you for other reasons. Talk with the doctor. What do I need to tell my doctor BEFORE I take this drug?  All products:  If you have an allergy to denosumab or any other part of this drug.  If you are allergic to any drugs like this one, any other drugs, foods, or other substances. Tell your doctor about the allergy and what signs you  had, like rash; hives; itching; shortness of breath; wheezing; cough; swelling of face, lips, tongue, or throat; or any other signs.  If you have low calcium levels.  Prolia:  If you are pregnant or may be pregnant. Do not take this drug if you are pregnant.  This is not a list of all drugs or health problems that interact with this drug.  Tell your doctor and pharmacist about all of your drugs (prescription or OTC, natural products, vitamins) and health problems. You must check to make sure that it is safe for you to take this drug with all of your drugs and health problems. Do not start, stop, or change the dose of any drug without checking with your doctor. What are some things I need to know or do while I take this  drug?  All products:  Tell dentists, surgeons, and other doctors that you use this drug.  This drug may raise the chance of a broken leg. Talk with your doctor.  Have your blood work checked. Talk with your doctor.  Have a bone density test. Talk with your doctor.  Take calcium and vitamin D as you were told by your doctor.  Have a dental exam before starting this drug.  Take good care of your teeth. See a dentist often.  If you smoke, talk with your doctor.  Do not give to a child. Talk with your doctor.  Tell your doctor if you are breast-feeding. You will need to talk about any risks to your baby.  Rivka Barbara:  This drug may cause harm to the unborn baby if you take it while you are pregnant. If you get pregnant while taking this drug, call your doctor right away.  Prolia:  Very bad infections have been reported with use of this drug. If you have any infection, are taking antibiotics now or in the recent past, or have many infections, talk with your doctor.  You may have more chance of getting an infection. Wash hands often. Stay away from people with infections, colds, or flu.  Use birth control that you can trust to prevent pregnancy while taking this drug.  If you are a man and your sex partner is pregnant or gets pregnant at any time while you are being treated, talk with your doctor. What are some side effects that I need to call my doctor about right away?  WARNING/CAUTION: Even though it may be rare, some people may have very bad and sometimes deadly side effects when taking a drug. Tell your doctor or get medical help right away if you have any of the following signs or symptoms that may be related to a very bad side effect:  All products:  Signs of an allergic reaction, like rash; hives; itching; red, swollen, blistered, or peeling skin with or without fever; wheezing; tightness in the chest or throat; trouble breathing or talking; unusual hoarseness; or swelling of the  mouth, face, lips, tongue, or throat.  Signs of low calcium levels like muscle cramps or spasms, numbness and tingling, or seizures.  Mouth sores.  Any new or strange groin, hip, or thigh pain.  This drug may cause jawbone problems. The chance may be higher the longer you take this drug. The chance may be higher if you have cancer, dental problems, dentures that do not fit well, anemia, blood clotting problems, or an infection. The chance may also be higher if you are having dental work or if you are getting chemo, some steroid  drugs, or radiation. Call your doctor right away if you have jaw swelling or pain.  Xgeva:  Not hungry.  Muscle pain or weakness.  Seizures.  Shortness of breath.  Prolia:  Signs of infection. These include a fever of 100.94F (38C) or higher, chills, very bad sore throat, ear or sinus pain, cough, more sputum or change in color of sputum, pain with passing urine, mouth sores, wound that will not heal, or anal itching or pain.  Signs of a pancreas problem (pancreatitis) like very bad stomach pain, very bad back pain, or very bad upset stomach or throwing up.  Chest pain.  A heartbeat that does not feel normal.  Very bad skin irritation.  Feeling very tired or weak.  Bladder pain or pain when passing urine or change in how much urine is passed.  Passing urine often.  Swelling in the arms or legs. What are some other side effects of this drug?  All drugs may cause side effects. However, many people have no side effects or only have minor side effects. Call your doctor or get medical help if any of these side effects or any other side effects bother you or do not go away:  Xgeva:  Feeling tired or weak.  Headache.  Upset stomach or throwing up.  Loose stools (diarrhea).  Cough.  Prolia:  Back pain.  Muscle or joint pain.  Sore throat.  Runny nose.  Pain in arms or legs.  These are not all of the side effects that may occur. If you  have questions about side effects, call your doctor. Call your doctor for medical advice about side effects.  You may report side effects to your national health agency. How is this drug best taken?  Use this drug as ordered by your doctor. Read and follow the dosing on the label closely.  It is given as a shot into the fatty part of the skin. What do I do if I miss a dose?  Call the doctor to find out what to do. How do I store and/or throw out this drug?  This drug will be given to you in a hospital or doctor's office. You will not store it at home.  Keep all drugs out of the reach of children and pets.  Check with your pharmacist about how to throw out unused drugs.  General drug facts  If your symptoms or health problems do not get better or if they become worse, call your doctor.  Do not share your drugs with others and do not take anyone else's drugs.  Keep a list of all your drugs (prescription, natural products, vitamins, OTC) with you. Give this list to your doctor.  Talk with the doctor before starting any new drug, including prescription or OTC, natural products, or vitamins.  Some drugs may have another patient information leaflet. If you have any questions about this drug, please talk with your doctor, pharmacist, or other health care provider.  If you think there has been an overdose, call your poison control center or get medical care right away. Be ready to tell or show what was taken, how much, and when it happened.

## 2015-11-17 NOTE — Progress Notes (Signed)
Patient ID: Breanna [name], female   DOB: [birth year], 82 y.o.   MRN: [id number]    HPI  Breanna [name] is a 82 y.o.-year-old femalemale, referred by Dr. Phineas Real, for management of osteoporosis.  Pt was dx with OP in ~2000.  I reviewed pt's DEXA scans: Date L1-L4 T score FN T score Distal Radius  11/08/2015 ([facility])  L1, L4: -1.3 (+7.9%*)  LFN: -2.9  RFN: -3.4  Left 33% distal: -2.4 (-6.5%*) Ultra distal: -2.0   09/22/2013 ([facility])  L1, L4: -1.9  LFN: -2.5  RFN: -2.8  Left 33% distal: -1.8  Ultra distal: -1.5  09/14/2011 ([facility])  L1-L4: -1.5 LFN: -2.5  RFN: -2.4  Left 33% distal: -2.4  Ultra distal: -2.6    She denies fractures or falls.  No dizziness/vertigo/orthostasis/poor vision.  Previous OP treatments:  - Actonel and Fosamax - up to 10 years - Reclast - 4 years She was on bisphosphonates for almost 14 years, stopped in 2015. At that time, she had to have oral surgery for "bone overgrowth related to bone meds" - apparently no ONJ. Pt saw oral surgeon then.   No h/o vitamin D deficiency. No available vit D levels: No results found for: VD25OH  Pt is on caltrate 600 mg 2x a day and vitamin D 5000 IU/day. She also eats dairy and green, leafy, vegetables.   + weight bearing exercises: walking.  Does yoga.  She does not take high vitamin A doses.  Menopause was at 20-50s y/o.   Pt does have a FH of osteoporosis - daughter at 5 y/o.  No h/o hyper/hypocalcemia or hyperparathyroidism. No h/o kidney stones. Lab Results  Component Value Date   CALCIUM 8.6 06/02/2014   CALCIUM 8.7 08/20/2012   CALCIUM 9.5 08/19/2012   CALCIUM 9.6 08/04/2012   CALCIUM 9.2 02/16/2012   CALCIUM 9.8 12/24/2006   No h/o thyrotoxicosis. She is on LT4. Dose changed a month ago from 88 g daily to alternating 88 with 75 micrograms every other day. Last TSH level: 10/06/2015: TSH 1.01  No h/o CKD. Last BUN/Cr: 12/07/2014: 22/0.68 Lab Results  Component Value Date    BUN 14 06/02/2014   CREATININE 0.52 06/02/2014    ROS: Constitutional: no weight gain/loss, no fatigue, no subjective hyperthermia/hypothermia, + Nocturia Eyes: no blurry vision, no xerophthalmia ENT: no sore throat, no nodules palpated in throat, no dysphagia/odynophagia, no hoarseness, + decreased hearing Cardiovascular: no CP/SOB/palpitations/leg swelling Respiratory: no cough/SOB Gastrointestinal: no N/V/D/C Musculoskeletal: no muscle/joint aches Skin: no rashes, + hair loss, + easy bruising Neurological: no tremors/numbness/tingling/dizziness Psychiatric: no depression/anxiety  Past Medical History:  Diagnosis Date  . Hypertension   . Hypothyroidism   . Osteoporosis, senile 08/2013   T score -2.8 overall stable to a slight decline from prior DEXA 2013  . Vertigo Nov 2013   Work up at [facility] for stroke-tests negative, no further problem   Past Surgical History:  Procedure Laterality Date  . BACK SURGERY  2006  . CYSTOCELE REPAIR N/A 08/19/2012   Procedure: ANTERIOR REPAIR (CYSTOCELE);  Surgeon: Reece Packer, MD;  Location: [facility];  Service: Urology;  Laterality: N/A;  . CYSTOSCOPY N/A 08/19/2012   Procedure: CYSTOSCOPY;  Surgeon: Reece Packer, MD;  Location: [facility];  Service: Urology;  Laterality: N/A;  . SALPINGOOPHORECTOMY Bilateral 08/19/2012   Procedure: SALPINGO OOPHORECTOMY;  Surgeon: Marvene Staff, MD;  Location: [facility];  Service: Gynecology;  Laterality: Bilateral;  . TONSILLECTOMY    . TUBAL LIGATION    .  VAGINAL HYSTERECTOMY N/A 08/19/2012   Procedure: HYSTERECTOMY VAGINAL;  Surgeon: Marvene Staff, MD;  Location: [facility];  Service: Gynecology;  Laterality: N/A;  . VAGINAL PROLAPSE REPAIR N/A 08/19/2012   Procedure: VAULT PROLAPSE WITH GRAFT;  Surgeon: Reece Packer, MD;  Location: [facility];  Service: Urology;  Laterality: N/A;   Social History   Social History  . Marital status: Married    Spouse name: N/A  . Children: Yes     Occupational History  . Retired    Social History Main Topics  . Smoking status: Never Smoker  . Smokeless tobacco: Never Used  . Alcohol use 0.0 oz/week     Comment: socially drinks wine  . Drug use: No   Current Outpatient Prescriptions on File Prior to Visit  Medication Sig Dispense Refill  . acetaminophen (TYLENOL) 500 MG tablet Take 1,000 mg by mouth every 6 (six) hours as needed for mild pain.    Marland Kitchen aspirin EC 81 MG tablet Take 81 mg by mouth daily.    . Calcium-Vitamin D (CALTRATE 600 PLUS-VIT D PO) Take 1 tablet by mouth 2 (two) times daily.    . Cholecalciferol 1000 UNITS tablet Take 1,000 Units by mouth daily.    . felodipine (PLENDIL) 10 MG 24 hr tablet Take 5 mg by mouth daily.     Marland Kitchen levothyroxine (SYNTHROID, LEVOTHROID) 88 MCG tablet Take 88 mcg by mouth daily before breakfast.    . Polyethyl Glycol-Propyl Glycol (SYSTANE OP) Apply 2 drops to eye daily as needed (for dry eyes.).    Marland Kitchen polyethylene glycol (MIRALAX / GLYCOLAX) packet Take 17 g by mouth daily.    . Multiple Vitamins-Minerals (MH MACULAR HEALTH) MISC Take 1 tablet by mouth daily.     No current facility-administered medications on file prior to visit.    Allergies  Allergen Reactions  . Anesthetics, Amide Nausea And Vomiting    Also causes constipation.   Marland Kitchen Percocet [Oxycodone-Acetaminophen] Nausea And Vomiting  . Sulfa Antibiotics Nausea And Vomiting   Family History  Problem Relation Age of Onset  . Stroke Father   . Cancer - Colon Mother    PE: BP (!) 118/58 (BP Location: Left Arm, Patient Position: Sitting)   Pulse 88   Ht 5\' 1"  (1.549 m)   Wt 120 lb (54.4 kg)   SpO2 95%   BMI 22.67 kg/m  Wt Readings from Last 3 Encounters:  11/17/15 120 lb (54.4 kg)  05/23/15 121 lb (54.9 kg)  12/26/14 119 lb (54 kg)   Constitutional: Thin, in NAD. No kyphosis. Eyes: PERRLA, EOMI, no exophthalmos ENT: moist mucous membranes, no thyromegaly, no cervical lymphadenopathy Cardiovascular: RRR, No  MRG Respiratory: CTA B Gastrointestinal: abdomen soft, NT, ND, BS+ Musculoskeletal: no deformities, strength intact in all 4 Skin: moist, warm, no rashes Neurological: no tremor with outstretched hands, DTR normal in all 4  Assessment: 1. Osteoporosis  Plan: 1. 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 We reviewed her last 2 DEXA scan reports together, and I explained that based on the T scores, she has an increased risk for fractures.  - we reviewed her dietary and supplemental calcium and vitamin D intake. She 1200 mg of calcium daily only from supplements >> I advised her to continue. I will check vit D  today to see if she needs more supplementation, however I believe her supplementation is sufficient, at 5000 units daily - discussed fall precautions   - given handout from [facility]: weight bearing exercises - advised to do this every day or at least 5/7 days - we discussed about maintaining a good amount of protein in her diet. The recommended daily protein intake is ~0.8 g per kilogram per day, which for her would be a little more than 40 g a day. I advised her to try to aim for this amount, since a diet low in proteins can exacerbate osteoporosis. She is not smoking or drinking >2 drinks of alcohol a day. - We discussed about the different medication classes, benefits and side effects she has been on bisphosphonates for 14 years, without atypical fractures, however, she developed a jaw bone growth which was attributed to bisphosphonates. From her description, this does not appear to have been consistent with ONJ. I did explain that ONJ is still a risk but this is especially for patients undergoing dental surgery or cancer patient's - no dental workup in  progress or planned for her.  - I explained that, since she had a long course of oral + IV  Bisphosphonates, with a bone density decreasing abruptly after stopping bisphosphonates, I would suggest treatment with  sq denosumab (Prolia) for 3-6 years. I explained that we need to continue with another agent after stopping Prolia, to avoid decrease in BMD. This could be another dose of zoledronic acid (iv Reclast) or maybe teriparatide for 2 years. Pt was given reading information about Prolia, and I explained the mechanism of action and expected benefits. Patient agrees to start Prolia.  -  will check her kidney function now, along with a vitamin D and the PTH level (since she has decreased BMD at 33% distal radius); if labs normal, will arrange for a Prolia inj - will check a new DEXA scan in 2 years after starting Prolia: unchanged or slightly higher T-scores are desirable - will see pt back in a year  Orders Placed This Encounter  Procedures  . BASIC METABOLIC PANEL WITH GFR  . Parathyroid hormone, intact (no Ca)  . VITAMIN D 25 Hydroxy (Vit-D Deficiency, Fractures)   - time spent with the patient: 1 hour, of which >50% was spent in obtaining information about her symptoms, reviewing her previous labs, evaluations, and treatments, counseling her about her condition (please see the discussed topics above), and developing a plan to further investigate it; she had a number of questions which I addressed.  Component     Latest Ref Rng & Units 11/17/2015          Sodium     135 - 146 mmol/L 136  Potassium     3.5 - 5.3 mmol/L 4.6  Chloride     98 - 110 mmol/L 103  CO2     20 - 31 mmol/L 23  Glucose     65 - 99 mg/dL 114 (H)  BUN     7 - 25 mg/dL 23  Creatinine     0.60 - 0.88 mg/dL 0.80  Calcium     8.6 - 10.4 mg/dL 9.4  GFR, Est African American     >=60 mL/min 79  GFR, Est Non African American     >=60 mL/min 68  PTH     15 - 65 pg/mL 37  VITD     30.00 - 100.00 ng/mL 82.73  Labs are normal, except hyperglycemia. We will go ahead with Prolia.  Philemon Kingdom, MD PhD [facility] Endocrinology

## 2015-11-18 ENCOUNTER — Telehealth: Payer: Self-pay

## 2015-11-18 ENCOUNTER — Encounter: Payer: Self-pay | Admitting: Internal Medicine

## 2015-11-18 DIAGNOSIS — M9902 Segmental and somatic dysfunction of thoracic region: Secondary | ICD-10-CM | POA: Diagnosis not present

## 2015-11-18 DIAGNOSIS — M542 Cervicalgia: Secondary | ICD-10-CM | POA: Diagnosis not present

## 2015-11-18 DIAGNOSIS — M546 Pain in thoracic spine: Secondary | ICD-10-CM | POA: Diagnosis not present

## 2015-11-18 DIAGNOSIS — M9901 Segmental and somatic dysfunction of cervical region: Secondary | ICD-10-CM | POA: Diagnosis not present

## 2015-11-18 LAB — PARATHYROID HORMONE, INTACT (NO CA): PTH: 37 pg/mL (ref 15–65)

## 2015-11-18 NOTE — Telephone Encounter (Signed)
Can we start a PA for prolia injection? Thank you!

## 2015-11-21 ENCOUNTER — Telehealth: Payer: Self-pay

## 2015-11-21 NOTE — Telephone Encounter (Signed)
Called and notified patient of lab results, and that we had sent of PA to be completed for Prolia injection; just waiting for response. Patient asked how to bring glucose down, I advised to eat healthy foods, and to watch sugar intake, patient stated she would. No other questions or concerns at this time.

## 2015-11-22 DIAGNOSIS — M9901 Segmental and somatic dysfunction of cervical region: Secondary | ICD-10-CM | POA: Diagnosis not present

## 2015-11-22 DIAGNOSIS — M542 Cervicalgia: Secondary | ICD-10-CM | POA: Diagnosis not present

## 2015-11-22 DIAGNOSIS — M9902 Segmental and somatic dysfunction of thoracic region: Secondary | ICD-10-CM | POA: Diagnosis not present

## 2015-11-22 DIAGNOSIS — M546 Pain in thoracic spine: Secondary | ICD-10-CM | POA: Diagnosis not present

## 2015-11-22 NOTE — Telephone Encounter (Signed)
I have electronically submitted pt's info for Prolia insurance verification and will notify you once I have a response. Thank you. °

## 2015-11-23 DIAGNOSIS — M542 Cervicalgia: Secondary | ICD-10-CM | POA: Diagnosis not present

## 2015-11-23 DIAGNOSIS — M546 Pain in thoracic spine: Secondary | ICD-10-CM | POA: Diagnosis not present

## 2015-11-23 DIAGNOSIS — M9901 Segmental and somatic dysfunction of cervical region: Secondary | ICD-10-CM | POA: Diagnosis not present

## 2015-11-23 DIAGNOSIS — M9902 Segmental and somatic dysfunction of thoracic region: Secondary | ICD-10-CM | POA: Diagnosis not present

## 2015-11-24 DIAGNOSIS — M9901 Segmental and somatic dysfunction of cervical region: Secondary | ICD-10-CM | POA: Diagnosis not present

## 2015-11-24 DIAGNOSIS — M9902 Segmental and somatic dysfunction of thoracic region: Secondary | ICD-10-CM | POA: Diagnosis not present

## 2015-11-24 DIAGNOSIS — M546 Pain in thoracic spine: Secondary | ICD-10-CM | POA: Diagnosis not present

## 2015-11-24 DIAGNOSIS — M542 Cervicalgia: Secondary | ICD-10-CM | POA: Diagnosis not present

## 2015-11-29 ENCOUNTER — Telehealth: Payer: Self-pay | Admitting: Gynecology

## 2015-11-29 DIAGNOSIS — M546 Pain in thoracic spine: Secondary | ICD-10-CM | POA: Diagnosis not present

## 2015-11-29 DIAGNOSIS — M9901 Segmental and somatic dysfunction of cervical region: Secondary | ICD-10-CM | POA: Diagnosis not present

## 2015-11-29 DIAGNOSIS — M9902 Segmental and somatic dysfunction of thoracic region: Secondary | ICD-10-CM | POA: Diagnosis not present

## 2015-11-29 DIAGNOSIS — M542 Cervicalgia: Secondary | ICD-10-CM | POA: Diagnosis not present

## 2015-11-29 NOTE — Telephone Encounter (Signed)
Voice mail from patient asking when she should come in for Prolia injection. I reviewed her notes and found that Dr Cruzita Lederer has ordered Prolia for her and that they are checking her benefits. I am confused due to phone call from patient asking me if I had checked her benefits and when could she come in for injection. PC to patient she was happy with this MD but she is confused is she to have Prolia here or at Dr Cruzita Lederer office. They told her to return for a visit and I noted the above notes after I received her phone call. Please advise me.

## 2015-11-29 NOTE — Telephone Encounter (Signed)
She should continue to follow up with Dr. Cruzita Lederer for both her injections and ongoing monitoring.

## 2015-11-29 NOTE — Telephone Encounter (Signed)
PC to pt left VM and message regarding Prolia per instructions from Dr Phineas Real.

## 2015-11-30 DIAGNOSIS — M546 Pain in thoracic spine: Secondary | ICD-10-CM | POA: Diagnosis not present

## 2015-11-30 DIAGNOSIS — M9902 Segmental and somatic dysfunction of thoracic region: Secondary | ICD-10-CM | POA: Diagnosis not present

## 2015-11-30 DIAGNOSIS — M9901 Segmental and somatic dysfunction of cervical region: Secondary | ICD-10-CM | POA: Diagnosis not present

## 2015-11-30 DIAGNOSIS — M542 Cervicalgia: Secondary | ICD-10-CM | POA: Diagnosis not present

## 2015-12-01 DIAGNOSIS — M9901 Segmental and somatic dysfunction of cervical region: Secondary | ICD-10-CM | POA: Diagnosis not present

## 2015-12-01 DIAGNOSIS — M9902 Segmental and somatic dysfunction of thoracic region: Secondary | ICD-10-CM | POA: Diagnosis not present

## 2015-12-01 DIAGNOSIS — M542 Cervicalgia: Secondary | ICD-10-CM | POA: Diagnosis not present

## 2015-12-01 DIAGNOSIS — M546 Pain in thoracic spine: Secondary | ICD-10-CM | POA: Diagnosis not present

## 2015-12-06 DIAGNOSIS — M542 Cervicalgia: Secondary | ICD-10-CM | POA: Diagnosis not present

## 2015-12-06 DIAGNOSIS — M9901 Segmental and somatic dysfunction of cervical region: Secondary | ICD-10-CM | POA: Diagnosis not present

## 2015-12-06 DIAGNOSIS — M546 Pain in thoracic spine: Secondary | ICD-10-CM | POA: Diagnosis not present

## 2015-12-06 DIAGNOSIS — M9902 Segmental and somatic dysfunction of thoracic region: Secondary | ICD-10-CM | POA: Diagnosis not present

## 2015-12-08 DIAGNOSIS — M546 Pain in thoracic spine: Secondary | ICD-10-CM | POA: Diagnosis not present

## 2015-12-08 DIAGNOSIS — M542 Cervicalgia: Secondary | ICD-10-CM | POA: Diagnosis not present

## 2015-12-08 DIAGNOSIS — M9901 Segmental and somatic dysfunction of cervical region: Secondary | ICD-10-CM | POA: Diagnosis not present

## 2015-12-08 DIAGNOSIS — M9902 Segmental and somatic dysfunction of thoracic region: Secondary | ICD-10-CM | POA: Diagnosis not present

## 2015-12-08 NOTE — Telephone Encounter (Signed)
I have rec'd Ms. Breanna [name]'s insurance verification for Prolia and her 2ndary insurance is requiring a prior authorization.  I have completed most of the p/a form, however, either you or Dr. Cruzita Lederer will need to complete questions 1-3 in the 2nd box and Dr. Cruzita Lederer will need to sign in the 4th box at the bottom.  I am faxing the form to your attn, so please let me know if you do not rec it.  Once complete you can return to me via fax [phone].  Thank you!

## 2015-12-13 DIAGNOSIS — M9902 Segmental and somatic dysfunction of thoracic region: Secondary | ICD-10-CM | POA: Diagnosis not present

## 2015-12-13 DIAGNOSIS — M542 Cervicalgia: Secondary | ICD-10-CM | POA: Diagnosis not present

## 2015-12-13 DIAGNOSIS — M546 Pain in thoracic spine: Secondary | ICD-10-CM | POA: Diagnosis not present

## 2015-12-13 DIAGNOSIS — M9901 Segmental and somatic dysfunction of cervical region: Secondary | ICD-10-CM | POA: Diagnosis not present

## 2015-12-13 NOTE — Telephone Encounter (Signed)
I have rec'd the completed form and sent it along w/the bone density results from 11/08/2015.  I will let you know once I have a response.  Thank you!

## 2015-12-15 DIAGNOSIS — M546 Pain in thoracic spine: Secondary | ICD-10-CM | POA: Diagnosis not present

## 2015-12-15 DIAGNOSIS — M9902 Segmental and somatic dysfunction of thoracic region: Secondary | ICD-10-CM | POA: Diagnosis not present

## 2015-12-15 DIAGNOSIS — M542 Cervicalgia: Secondary | ICD-10-CM | POA: Diagnosis not present

## 2015-12-15 DIAGNOSIS — M9901 Segmental and somatic dysfunction of cervical region: Secondary | ICD-10-CM | POA: Diagnosis not present

## 2015-12-19 DIAGNOSIS — I10 Essential (primary) hypertension: Secondary | ICD-10-CM | POA: Diagnosis not present

## 2015-12-19 DIAGNOSIS — Z1389 Encounter for screening for other disorder: Secondary | ICD-10-CM | POA: Diagnosis not present

## 2015-12-19 DIAGNOSIS — Z79899 Other long term (current) drug therapy: Secondary | ICD-10-CM | POA: Diagnosis not present

## 2015-12-19 DIAGNOSIS — E039 Hypothyroidism, unspecified: Secondary | ICD-10-CM | POA: Diagnosis not present

## 2015-12-19 DIAGNOSIS — Z0000 Encounter for general adult medical examination without abnormal findings: Secondary | ICD-10-CM | POA: Diagnosis not present

## 2015-12-19 DIAGNOSIS — D539 Nutritional anemia, unspecified: Secondary | ICD-10-CM | POA: Diagnosis not present

## 2015-12-19 DIAGNOSIS — Z23 Encounter for immunization: Secondary | ICD-10-CM | POA: Diagnosis not present

## 2015-12-20 DIAGNOSIS — M9902 Segmental and somatic dysfunction of thoracic region: Secondary | ICD-10-CM | POA: Diagnosis not present

## 2015-12-20 DIAGNOSIS — M546 Pain in thoracic spine: Secondary | ICD-10-CM | POA: Diagnosis not present

## 2015-12-20 DIAGNOSIS — M9901 Segmental and somatic dysfunction of cervical region: Secondary | ICD-10-CM | POA: Diagnosis not present

## 2015-12-20 DIAGNOSIS — M542 Cervicalgia: Secondary | ICD-10-CM | POA: Diagnosis not present

## 2015-12-22 DIAGNOSIS — M9902 Segmental and somatic dysfunction of thoracic region: Secondary | ICD-10-CM | POA: Diagnosis not present

## 2015-12-22 DIAGNOSIS — M9901 Segmental and somatic dysfunction of cervical region: Secondary | ICD-10-CM | POA: Diagnosis not present

## 2015-12-22 DIAGNOSIS — M542 Cervicalgia: Secondary | ICD-10-CM | POA: Diagnosis not present

## 2015-12-22 DIAGNOSIS — M546 Pain in thoracic spine: Secondary | ICD-10-CM | POA: Diagnosis not present

## 2015-12-26 DIAGNOSIS — M9902 Segmental and somatic dysfunction of thoracic region: Secondary | ICD-10-CM | POA: Diagnosis not present

## 2015-12-26 DIAGNOSIS — M546 Pain in thoracic spine: Secondary | ICD-10-CM | POA: Diagnosis not present

## 2015-12-26 DIAGNOSIS — M542 Cervicalgia: Secondary | ICD-10-CM | POA: Diagnosis not present

## 2015-12-26 DIAGNOSIS — M9901 Segmental and somatic dysfunction of cervical region: Secondary | ICD-10-CM | POA: Diagnosis not present

## 2015-12-29 DIAGNOSIS — M9902 Segmental and somatic dysfunction of thoracic region: Secondary | ICD-10-CM | POA: Diagnosis not present

## 2015-12-29 DIAGNOSIS — M546 Pain in thoracic spine: Secondary | ICD-10-CM | POA: Diagnosis not present

## 2015-12-29 DIAGNOSIS — M9901 Segmental and somatic dysfunction of cervical region: Secondary | ICD-10-CM | POA: Diagnosis not present

## 2015-12-29 DIAGNOSIS — M542 Cervicalgia: Secondary | ICD-10-CM | POA: Diagnosis not present

## 2015-12-30 IMAGING — CT CT ABD-PELV W/ CM
1 of 5 series · 13 of 32 positions shown, 19 images · IV contrast (OMNIPAQUE 300)
Comparison: 12/24/2006

CLINICAL DATA: Lower abdominal pain after pelvic reconstructive
surgery 05/31/2014.

EXAM:
CT ABDOMEN AND PELVIS WITH CONTRAST
TECHNIQUE: Multidetector CT imaging of the abdomen and pelvis was performed
using the standard protocol following bolus administration of
intravenous contrast.
CONTRAST:  50mL OMNIPAQUE IOHEXOL 300 MG/ML SOLN, 100mL OMNIPAQUE
IOHEXOL 300 MG/ML SOLN

[Series 2: abd/pel with · axial · 0.64mm/px · z∈[-76,+264]mm · 13 of 80 slices shown, 19 images]
[im 6/80  soft-tissue]
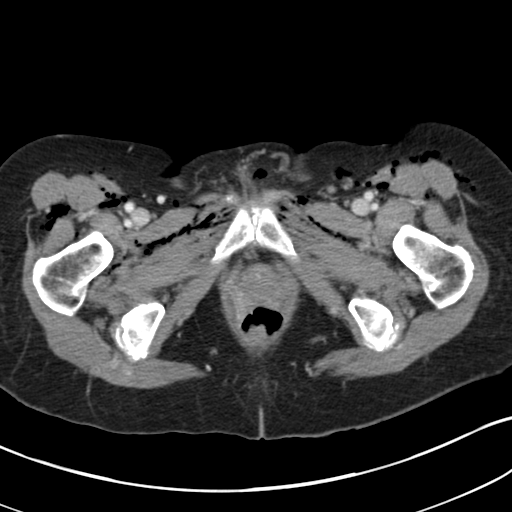
[im 6/80  bone]
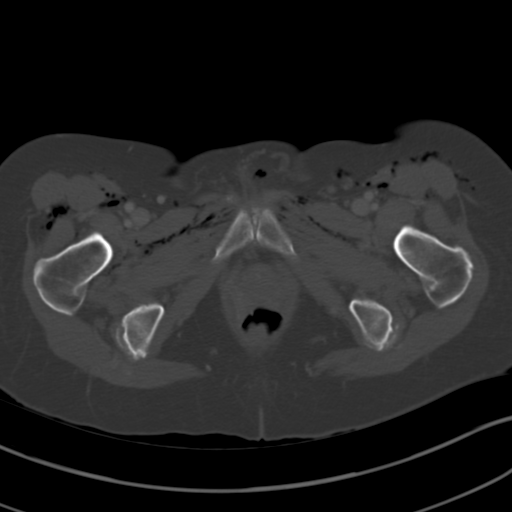
[im 12/80  soft-tissue]
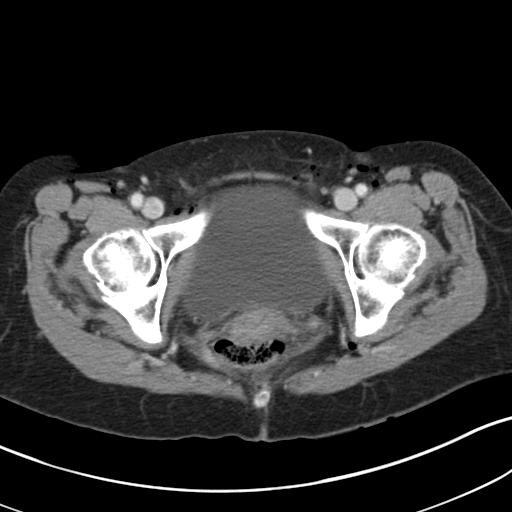
[im 17/80  soft-tissue]
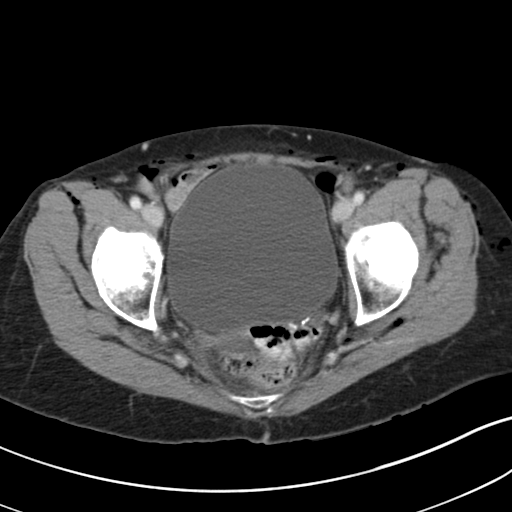
[im 23/80  soft-tissue]
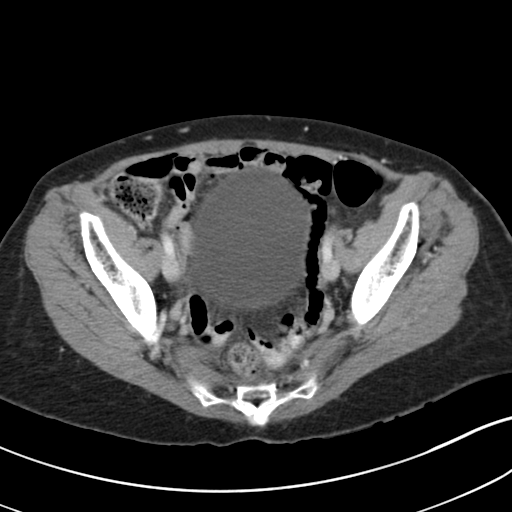
[im 29/80  soft-tissue]
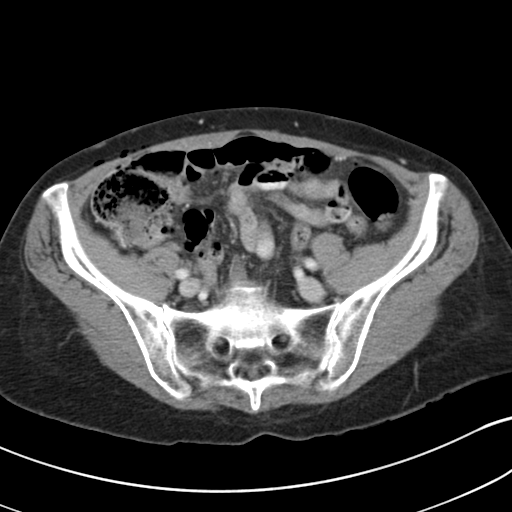
[im 34/80  soft-tissue]
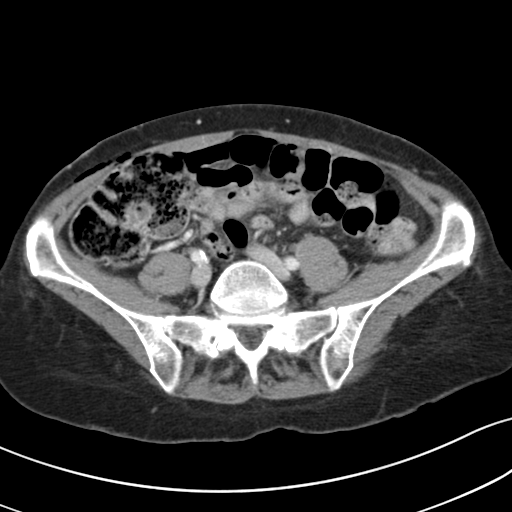
[im 40/80  soft-tissue]
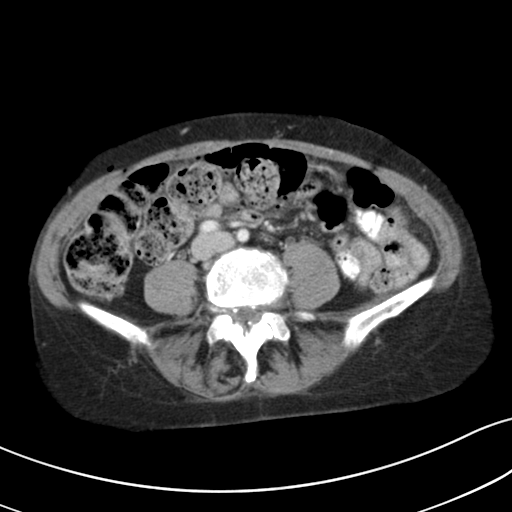
[im 46/80  soft-tissue]
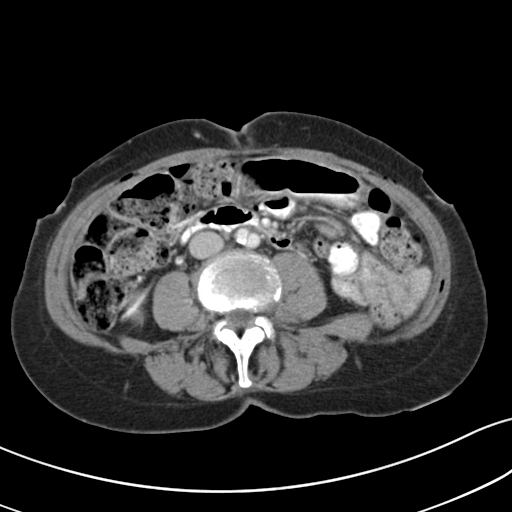
[im 51/80  soft-tissue]
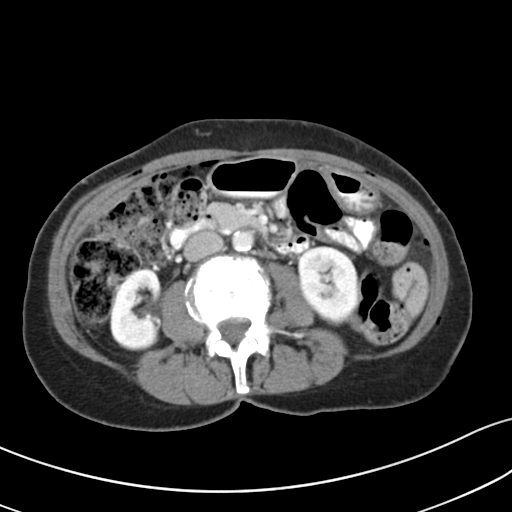
[im 51/80  bone]
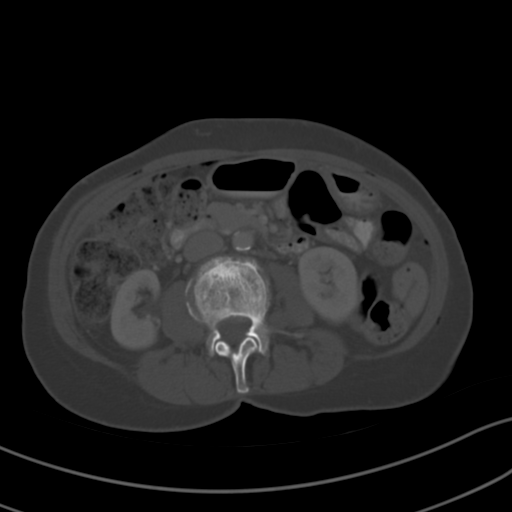
[im 57/80  soft-tissue]
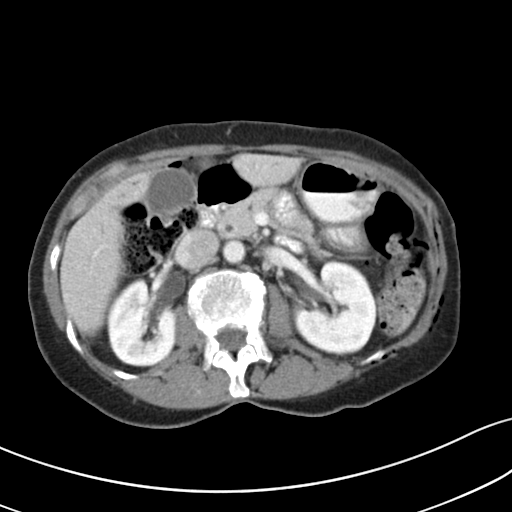
[im 57/80  lung]
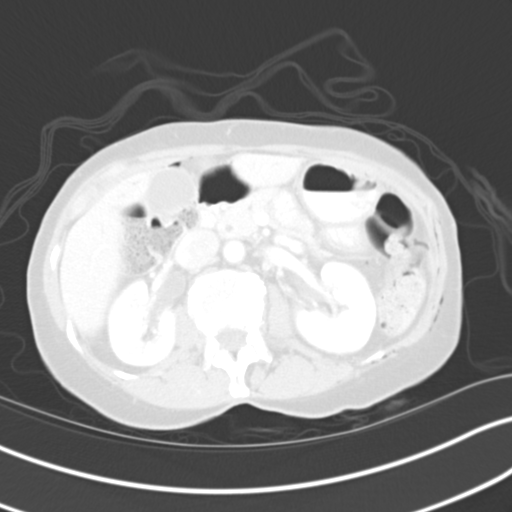
[im 63/80  soft-tissue]
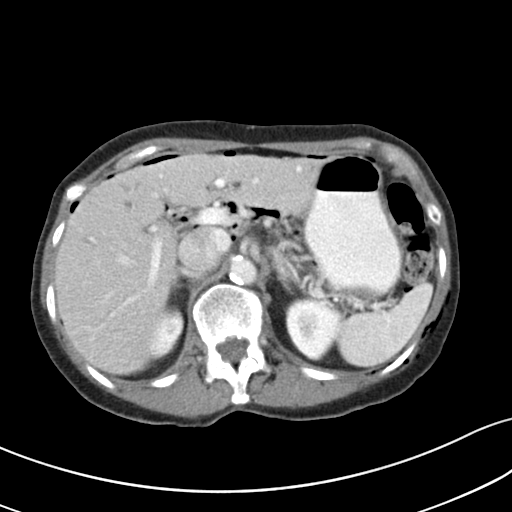
[im 63/80  lung]
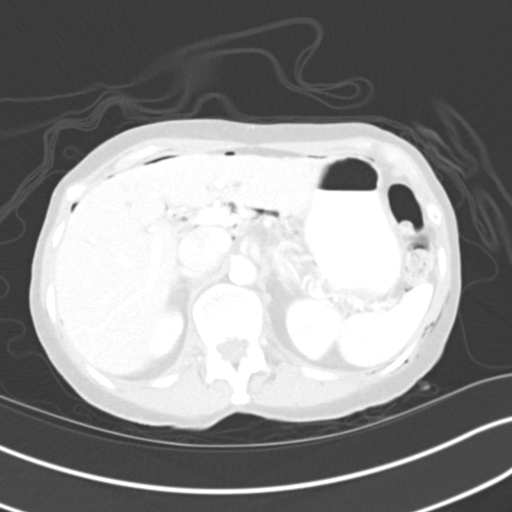
[im 68/80  soft-tissue]
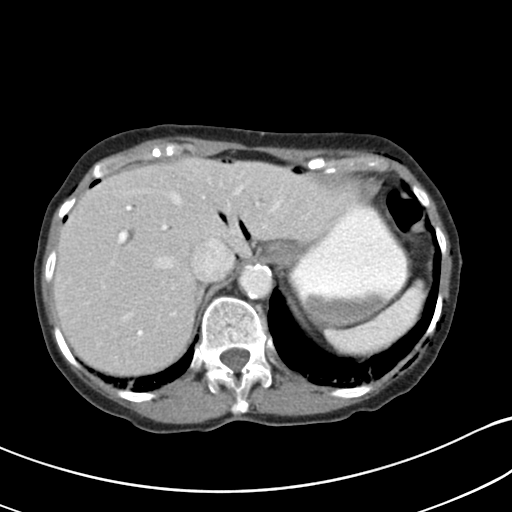
[im 68/80  lung]
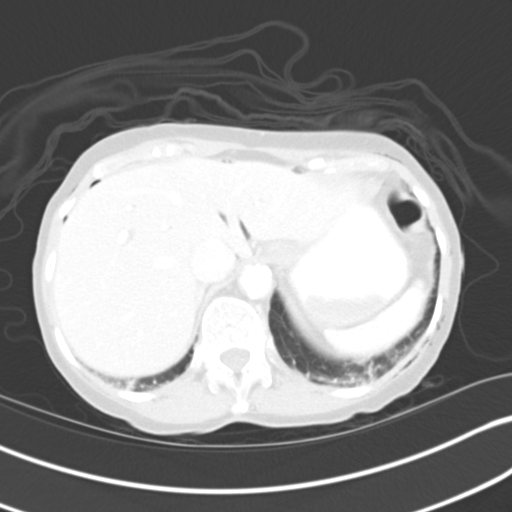
[im 74/80  soft-tissue]
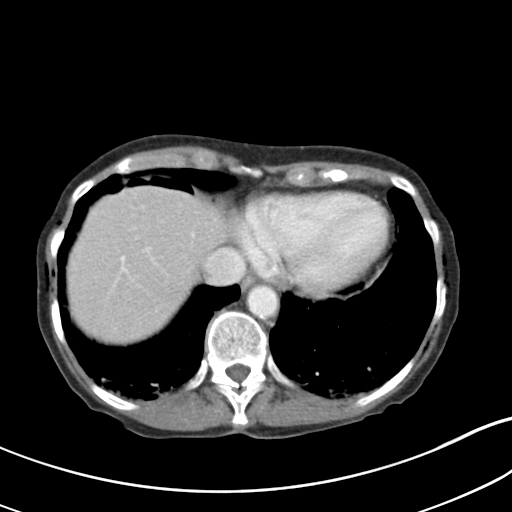
[im 74/80  lung]
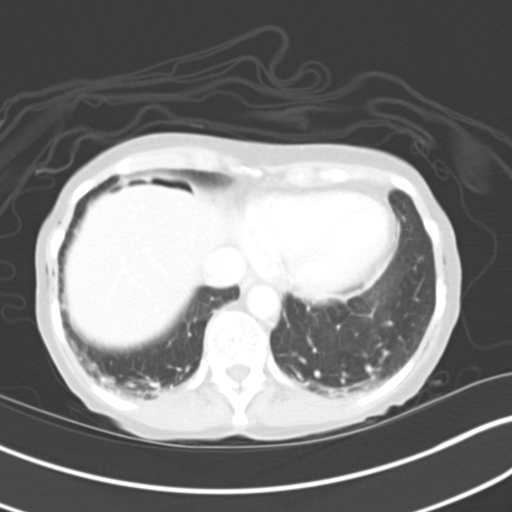

[13 of 32 positions shown; findings below may reference images not displayed]

FINDINGS: BODY WALL: Diffuse subcutaneous gas correlating with recent
abdominal surgery

LOWER CHEST: Unremarkable.

ABDOMEN/PELVIS:

Liver: No focal abnormality.

Biliary: Chronic intrahepatic biliary dilatation. The gallbladder
and common bile duct are negative.

Pancreas: Unremarkable.

Spleen: Unremarkable.

Adrenals: Unremarkable.

Kidneys and ureters: No hydronephrosis or stone.

Bladder: Distended urinary bladder without wall thickening. There is
gas in the urinary bladder, possibly related to recent Foley
catheter. No urine leak on delayed imaging.

Reproductive: Hysterectomy and probable oophorectomies. There is
mild fat inflammation in the pelvis which is expected
postoperatively. No hematoma or abscess.

Bowel: No obstruction.

Retroperitoneum: No mass or adenopathy.

Peritoneum: Small pneumoperitoneum and extraperitoneal gas
correlating with recent surgery.

Vascular: No acute abnormality.

OSSEOUS: No acute abnormalities.
IMPRESSION: 1. Bladder distention, correlate for urinary retention.
2. Pelvic edema and small pneumoperitoneum correlating with recent
surgery.

## 2016-01-02 DIAGNOSIS — M9901 Segmental and somatic dysfunction of cervical region: Secondary | ICD-10-CM | POA: Diagnosis not present

## 2016-01-02 DIAGNOSIS — M542 Cervicalgia: Secondary | ICD-10-CM | POA: Diagnosis not present

## 2016-01-02 DIAGNOSIS — M546 Pain in thoracic spine: Secondary | ICD-10-CM | POA: Diagnosis not present

## 2016-01-02 DIAGNOSIS — M9902 Segmental and somatic dysfunction of thoracic region: Secondary | ICD-10-CM | POA: Diagnosis not present

## 2016-01-02 NOTE — Telephone Encounter (Signed)
I contacted [employer] in ref to p/a which was faxed to them 05/15/2015 and per Nania P. (ref #[id number]), since [facility] is primary no p/a will be required.  Ms. Dewinter estimated responsibility will be $125.  Please make pt aware this is an estimate and we will not know an exact amt until insurance(s) has/have paid.  I have sent a copy of the summary of benefits to be scanned into pt's chart.    Once pt recs injection, please let me know actual injection date so I can update the Prolia portal.  If you have any questions, please let me know.  Thank you!

## 2016-01-03 ENCOUNTER — Telehealth: Payer: Self-pay

## 2016-01-03 NOTE — Telephone Encounter (Signed)
Called patient and left message advising of Prolia injection payment of $125. Advised this was an estimate amount, until insurance does pay. Gave call back number so patient could call and schedule Prolia injection.

## 2016-01-04 ENCOUNTER — Ambulatory Visit (INDEPENDENT_AMBULATORY_CARE_PROVIDER_SITE_OTHER): Payer: Medicare Other

## 2016-01-04 ENCOUNTER — Ambulatory Visit: Payer: Medicare Other

## 2016-01-04 DIAGNOSIS — M9901 Segmental and somatic dysfunction of cervical region: Secondary | ICD-10-CM | POA: Diagnosis not present

## 2016-01-04 DIAGNOSIS — M9902 Segmental and somatic dysfunction of thoracic region: Secondary | ICD-10-CM | POA: Diagnosis not present

## 2016-01-04 DIAGNOSIS — M546 Pain in thoracic spine: Secondary | ICD-10-CM | POA: Diagnosis not present

## 2016-01-04 DIAGNOSIS — M810 Age-related osteoporosis without current pathological fracture: Secondary | ICD-10-CM | POA: Diagnosis not present

## 2016-01-04 DIAGNOSIS — M542 Cervicalgia: Secondary | ICD-10-CM | POA: Diagnosis not present

## 2016-01-04 MED ORDER — DENOSUMAB 60 MG/ML ~~LOC~~ SOLN
60.0000 mg | Freq: Once | SUBCUTANEOUS | Status: DC
  Administered 2016-01-04: 60 mg via SUBCUTANEOUS

## 2016-01-05 MED ORDER — DENOSUMAB 60 MG/ML ~~LOC~~ SOLN: 60.0000 mg | Freq: Once | SUBCUTANEOUS | Status: DC

## 2016-01-09 DIAGNOSIS — M542 Cervicalgia: Secondary | ICD-10-CM | POA: Diagnosis not present

## 2016-01-09 DIAGNOSIS — M9902 Segmental and somatic dysfunction of thoracic region: Secondary | ICD-10-CM | POA: Diagnosis not present

## 2016-01-09 DIAGNOSIS — M546 Pain in thoracic spine: Secondary | ICD-10-CM | POA: Diagnosis not present

## 2016-01-09 DIAGNOSIS — M9901 Segmental and somatic dysfunction of cervical region: Secondary | ICD-10-CM | POA: Diagnosis not present

## 2016-01-12 DIAGNOSIS — M9902 Segmental and somatic dysfunction of thoracic region: Secondary | ICD-10-CM | POA: Diagnosis not present

## 2016-01-12 DIAGNOSIS — M542 Cervicalgia: Secondary | ICD-10-CM | POA: Diagnosis not present

## 2016-01-12 DIAGNOSIS — M546 Pain in thoracic spine: Secondary | ICD-10-CM | POA: Diagnosis not present

## 2016-01-12 DIAGNOSIS — M9901 Segmental and somatic dysfunction of cervical region: Secondary | ICD-10-CM | POA: Diagnosis not present

## 2016-01-18 DIAGNOSIS — M546 Pain in thoracic spine: Secondary | ICD-10-CM | POA: Diagnosis not present

## 2016-01-18 DIAGNOSIS — M9901 Segmental and somatic dysfunction of cervical region: Secondary | ICD-10-CM | POA: Diagnosis not present

## 2016-01-18 DIAGNOSIS — M9902 Segmental and somatic dysfunction of thoracic region: Secondary | ICD-10-CM | POA: Diagnosis not present

## 2016-01-18 DIAGNOSIS — M542 Cervicalgia: Secondary | ICD-10-CM | POA: Diagnosis not present

## 2016-01-24 DIAGNOSIS — M542 Cervicalgia: Secondary | ICD-10-CM | POA: Diagnosis not present

## 2016-01-24 DIAGNOSIS — M9902 Segmental and somatic dysfunction of thoracic region: Secondary | ICD-10-CM | POA: Diagnosis not present

## 2016-01-24 DIAGNOSIS — M546 Pain in thoracic spine: Secondary | ICD-10-CM | POA: Diagnosis not present

## 2016-01-24 DIAGNOSIS — M9901 Segmental and somatic dysfunction of cervical region: Secondary | ICD-10-CM | POA: Diagnosis not present

## 2016-02-01 DIAGNOSIS — M546 Pain in thoracic spine: Secondary | ICD-10-CM | POA: Diagnosis not present

## 2016-02-01 DIAGNOSIS — M9901 Segmental and somatic dysfunction of cervical region: Secondary | ICD-10-CM | POA: Diagnosis not present

## 2016-02-01 DIAGNOSIS — M9902 Segmental and somatic dysfunction of thoracic region: Secondary | ICD-10-CM | POA: Diagnosis not present

## 2016-02-01 DIAGNOSIS — M542 Cervicalgia: Secondary | ICD-10-CM | POA: Diagnosis not present

## 2016-02-08 DIAGNOSIS — M9902 Segmental and somatic dysfunction of thoracic region: Secondary | ICD-10-CM | POA: Diagnosis not present

## 2016-02-08 DIAGNOSIS — M542 Cervicalgia: Secondary | ICD-10-CM | POA: Diagnosis not present

## 2016-02-08 DIAGNOSIS — M546 Pain in thoracic spine: Secondary | ICD-10-CM | POA: Diagnosis not present

## 2016-02-08 DIAGNOSIS — M9901 Segmental and somatic dysfunction of cervical region: Secondary | ICD-10-CM | POA: Diagnosis not present

## 2016-02-13 ENCOUNTER — Ambulatory Visit (INDEPENDENT_AMBULATORY_CARE_PROVIDER_SITE_OTHER): Payer: Medicare Other | Admitting: Ophthalmology

## 2016-02-13 DIAGNOSIS — H3554 Dystrophies primarily involving the retinal pigment epithelium: Secondary | ICD-10-CM

## 2016-02-13 DIAGNOSIS — I10 Essential (primary) hypertension: Secondary | ICD-10-CM

## 2016-02-13 DIAGNOSIS — H353132 Nonexudative age-related macular degeneration, bilateral, intermediate dry stage: Secondary | ICD-10-CM | POA: Diagnosis not present

## 2016-02-13 DIAGNOSIS — H35033 Hypertensive retinopathy, bilateral: Secondary | ICD-10-CM

## 2016-02-13 DIAGNOSIS — H43813 Vitreous degeneration, bilateral: Secondary | ICD-10-CM

## 2016-03-05 DIAGNOSIS — M79604 Pain in right leg: Secondary | ICD-10-CM | POA: Diagnosis not present

## 2016-03-05 DIAGNOSIS — M6281 Muscle weakness (generalized): Secondary | ICD-10-CM | POA: Diagnosis not present

## 2016-03-05 DIAGNOSIS — M5431 Sciatica, right side: Secondary | ICD-10-CM | POA: Diagnosis not present

## 2016-03-05 DIAGNOSIS — M545 Low back pain: Secondary | ICD-10-CM | POA: Diagnosis not present

## 2016-03-09 DIAGNOSIS — M79604 Pain in right leg: Secondary | ICD-10-CM | POA: Diagnosis not present

## 2016-03-09 DIAGNOSIS — M545 Low back pain: Secondary | ICD-10-CM | POA: Diagnosis not present

## 2016-03-09 DIAGNOSIS — M6281 Muscle weakness (generalized): Secondary | ICD-10-CM | POA: Diagnosis not present

## 2016-03-09 DIAGNOSIS — M5431 Sciatica, right side: Secondary | ICD-10-CM | POA: Diagnosis not present

## 2016-03-13 DIAGNOSIS — M6281 Muscle weakness (generalized): Secondary | ICD-10-CM | POA: Diagnosis not present

## 2016-03-13 DIAGNOSIS — M5431 Sciatica, right side: Secondary | ICD-10-CM | POA: Diagnosis not present

## 2016-03-13 DIAGNOSIS — M545 Low back pain: Secondary | ICD-10-CM | POA: Diagnosis not present

## 2016-03-13 DIAGNOSIS — M79604 Pain in right leg: Secondary | ICD-10-CM | POA: Diagnosis not present

## 2016-03-16 DIAGNOSIS — M79604 Pain in right leg: Secondary | ICD-10-CM | POA: Diagnosis not present

## 2016-03-16 DIAGNOSIS — M6281 Muscle weakness (generalized): Secondary | ICD-10-CM | POA: Diagnosis not present

## 2016-03-16 DIAGNOSIS — M5431 Sciatica, right side: Secondary | ICD-10-CM | POA: Diagnosis not present

## 2016-03-16 DIAGNOSIS — M545 Low back pain: Secondary | ICD-10-CM | POA: Diagnosis not present

## 2016-03-20 DIAGNOSIS — M6281 Muscle weakness (generalized): Secondary | ICD-10-CM | POA: Diagnosis not present

## 2016-03-20 DIAGNOSIS — M5431 Sciatica, right side: Secondary | ICD-10-CM | POA: Diagnosis not present

## 2016-03-20 DIAGNOSIS — M79604 Pain in right leg: Secondary | ICD-10-CM | POA: Diagnosis not present

## 2016-03-20 DIAGNOSIS — M545 Low back pain: Secondary | ICD-10-CM | POA: Diagnosis not present

## 2016-03-22 DIAGNOSIS — M79604 Pain in right leg: Secondary | ICD-10-CM | POA: Diagnosis not present

## 2016-03-22 DIAGNOSIS — M5431 Sciatica, right side: Secondary | ICD-10-CM | POA: Diagnosis not present

## 2016-03-22 DIAGNOSIS — M6281 Muscle weakness (generalized): Secondary | ICD-10-CM | POA: Diagnosis not present

## 2016-03-22 DIAGNOSIS — M545 Low back pain: Secondary | ICD-10-CM | POA: Diagnosis not present

## 2016-04-03 DIAGNOSIS — J0110 Acute frontal sinusitis, unspecified: Secondary | ICD-10-CM | POA: Diagnosis not present

## 2016-04-03 DIAGNOSIS — I10 Essential (primary) hypertension: Secondary | ICD-10-CM | POA: Diagnosis not present

## 2016-04-10 DIAGNOSIS — M79604 Pain in right leg: Secondary | ICD-10-CM | POA: Diagnosis not present

## 2016-04-10 DIAGNOSIS — H52223 Regular astigmatism, bilateral: Secondary | ICD-10-CM | POA: Diagnosis not present

## 2016-04-10 DIAGNOSIS — H5213 Myopia, bilateral: Secondary | ICD-10-CM | POA: Diagnosis not present

## 2016-04-10 DIAGNOSIS — H35341 Macular cyst, hole, or pseudohole, right eye: Secondary | ICD-10-CM | POA: Diagnosis not present

## 2016-04-10 DIAGNOSIS — H02831 Dermatochalasis of right upper eyelid: Secondary | ICD-10-CM | POA: Diagnosis not present

## 2016-04-10 DIAGNOSIS — M6281 Muscle weakness (generalized): Secondary | ICD-10-CM | POA: Diagnosis not present

## 2016-04-10 DIAGNOSIS — H02834 Dermatochalasis of left upper eyelid: Secondary | ICD-10-CM | POA: Diagnosis not present

## 2016-04-10 DIAGNOSIS — M545 Low back pain: Secondary | ICD-10-CM | POA: Diagnosis not present

## 2016-04-10 DIAGNOSIS — H353132 Nonexudative age-related macular degeneration, bilateral, intermediate dry stage: Secondary | ICD-10-CM | POA: Diagnosis not present

## 2016-04-10 DIAGNOSIS — H524 Presbyopia: Secondary | ICD-10-CM | POA: Diagnosis not present

## 2016-04-10 DIAGNOSIS — M5431 Sciatica, right side: Secondary | ICD-10-CM | POA: Diagnosis not present

## 2016-04-13 DIAGNOSIS — M5431 Sciatica, right side: Secondary | ICD-10-CM | POA: Diagnosis not present

## 2016-04-13 DIAGNOSIS — M6281 Muscle weakness (generalized): Secondary | ICD-10-CM | POA: Diagnosis not present

## 2016-04-13 DIAGNOSIS — M79604 Pain in right leg: Secondary | ICD-10-CM | POA: Diagnosis not present

## 2016-04-13 DIAGNOSIS — M545 Low back pain: Secondary | ICD-10-CM | POA: Diagnosis not present

## 2016-04-26 DIAGNOSIS — M79604 Pain in right leg: Secondary | ICD-10-CM | POA: Diagnosis not present

## 2016-04-26 DIAGNOSIS — M5431 Sciatica, right side: Secondary | ICD-10-CM | POA: Diagnosis not present

## 2016-04-26 DIAGNOSIS — M6281 Muscle weakness (generalized): Secondary | ICD-10-CM | POA: Diagnosis not present

## 2016-04-26 DIAGNOSIS — M545 Low back pain: Secondary | ICD-10-CM | POA: Diagnosis not present

## 2016-05-23 ENCOUNTER — Ambulatory Visit (INDEPENDENT_AMBULATORY_CARE_PROVIDER_SITE_OTHER): Payer: Medicare Other | Admitting: Gynecology

## 2016-05-23 ENCOUNTER — Telehealth: Payer: Self-pay | Admitting: Gynecology

## 2016-05-23 ENCOUNTER — Encounter: Payer: Self-pay | Admitting: Gynecology

## 2016-05-23 VITALS — BP 118/76 | Ht 61.0 in | Wt 123.0 lb

## 2016-05-23 DIAGNOSIS — Z01411 Encounter for gynecological examination (general) (routine) with abnormal findings: Secondary | ICD-10-CM | POA: Diagnosis not present

## 2016-05-23 DIAGNOSIS — N816 Rectocele: Secondary | ICD-10-CM

## 2016-05-23 DIAGNOSIS — M810 Age-related osteoporosis without current pathological fracture: Secondary | ICD-10-CM

## 2016-05-23 DIAGNOSIS — N952 Postmenopausal atrophic vaginitis: Secondary | ICD-10-CM

## 2016-05-23 DIAGNOSIS — N368 Other specified disorders of urethra: Secondary | ICD-10-CM

## 2016-05-23 NOTE — Telephone Encounter (Signed)
Patient received her first Prolia at Dr Arman Filter office in October. She would like to continue to have them done here as it is more convenient. Can you check with their office to make sure they are okay with this and if so then go ahead and arrange for her first shot here in April.

## 2016-05-23 NOTE — Patient Instructions (Signed)
Office will call you to arrange for Prolia which should be due in April.

## 2016-05-23 NOTE — Telephone Encounter (Signed)
I will ask Anderson Malta to contact office to check on this note from Dr Phineas Real.

## 2016-05-23 NOTE — Progress Notes (Signed)
    Breanna [name] [birth year] [id number]        83 y.o.  G4P4 for breast and pelvic exam.  Past medical history,surgical history, problem list, medications, allergies, family history and social history were all reviewed and documented as reviewed in the EPIC chart.  ROS:  Performed with pertinent positives and negatives included in the history, assessment and plan.   Additional significant findings :  None   Exam: [name] assistant Vitals:   05/23/16 1021  BP: 118/76  Weight: 123 lb (55.8 kg)  Height: 5\' 1"  (1.549 m)   Body mass index is 23.24 kg/m.  General appearance:  Normal affect, orientation and appearance. Skin: Grossly normal HEENT: Without gross lesions.  No cervical or supraclavicular adenopathy. Thyroid normal.  Lungs:  Clear without wheezing, rales or rhonchi Cardiac: RR, without RMG Abdominal:  Soft, nontender, without masses, guarding, rebound, organomegaly or hernia Breasts:  Examined lying and sitting without masses, retractions, discharge or axillary adenopathy. Pelvic:  Ext, BUS, Vagina with atrophic changes. Slight rectocele noted. Cuff well supported.  Adnexa without masses or tenderness    Anus and perineum normal   Rectovaginal normal sphincter tone without palpated masses or tenderness.    Assessment/Plan:  83 y.o. G63P4 female for breast and pelvic exam .   1. Postmenopausal/atrophic genital changes. No significant hot flushes, night sweats, vaginal dryness. 2. History of TVH/BSO/anterior colporrhaphy/vaginal vault graft suspension. Subsequent robotic sacral colpopexy for a large rectocele. Doing well with slight rectocele now but otherwise good support. 3. Osteoporosis.  DEXA/2017 T score -3.4 with statistically significant loss all measured sites. Saw Dr. Cruzita Lederer and had Prolia initiated with first shot in October. Due this coming April. She asked about having the injections done here as it is more convenient for her. Will check with Dr.  Arman Filter office make sure they are okay with this and we'll initiate here. We'll plan on continuing through next year will and schedule repeat DEXA next fall. 4. Mammography coming due in May patient will schedule. SBE monthly reviewed. 5. Pap smear 2011. No Pap smear done today. No history of significant abnormal Pap smears. We both agree to stop screening per current screening guidelines. 6. Colonoscopy reported 2007. Will discuss with her primary physician about continuing screening and options. 7. Health maintenance. No routine lab work done as patient does this at her primary physician's office. Follow up for Prolia and April otherwise follow up in one year, sooner as needed.  Anastasio Auerbach MD, 10:49 AM 05/23/2016

## 2016-05-23 NOTE — Telephone Encounter (Signed)
Dr.Gherge note on 11/17/2015  said it was fine for her to have Prolia injection done at her office. Pt needs to call and contact her to proceed with this.

## 2016-05-30 DIAGNOSIS — H35341 Macular cyst, hole, or pseudohole, right eye: Secondary | ICD-10-CM | POA: Diagnosis not present

## 2016-05-30 DIAGNOSIS — H02834 Dermatochalasis of left upper eyelid: Secondary | ICD-10-CM | POA: Diagnosis not present

## 2016-05-30 DIAGNOSIS — H02831 Dermatochalasis of right upper eyelid: Secondary | ICD-10-CM | POA: Diagnosis not present

## 2016-05-30 DIAGNOSIS — H353132 Nonexudative age-related macular degeneration, bilateral, intermediate dry stage: Secondary | ICD-10-CM | POA: Diagnosis not present

## 2016-05-31 NOTE — Telephone Encounter (Signed)
Past history of Actonel  For 10-12 years  And Reclast for 3 years with prior OBGYN. Recent switch to Prolia 01/04/16 Patient would like to receive Prolia at [facility] next injection due 07/05/16

## 2016-06-01 ENCOUNTER — Encounter (INDEPENDENT_AMBULATORY_CARE_PROVIDER_SITE_OTHER): Payer: Medicare Other | Admitting: Ophthalmology

## 2016-06-01 DIAGNOSIS — H35033 Hypertensive retinopathy, bilateral: Secondary | ICD-10-CM

## 2016-06-01 DIAGNOSIS — I10 Essential (primary) hypertension: Secondary | ICD-10-CM

## 2016-06-01 DIAGNOSIS — H43813 Vitreous degeneration, bilateral: Secondary | ICD-10-CM | POA: Diagnosis not present

## 2016-06-01 DIAGNOSIS — H43821 Vitreomacular adhesion, right eye: Secondary | ICD-10-CM | POA: Diagnosis not present

## 2016-06-01 DIAGNOSIS — H353132 Nonexudative age-related macular degeneration, bilateral, intermediate dry stage: Secondary | ICD-10-CM

## 2016-06-05 ENCOUNTER — Telehealth: Payer: Self-pay | Admitting: *Deleted

## 2016-06-05 NOTE — Telephone Encounter (Signed)
Information has been submitted to pts insurance for verification of benefits. Awaiting response for coverage  

## 2016-06-18 ENCOUNTER — Telehealth: Payer: Self-pay

## 2016-06-18 NOTE — Telephone Encounter (Signed)
Called patient and LVM advising her to call back regarding the Prolia injection.  Gave call back number.

## 2016-06-18 NOTE — Telephone Encounter (Signed)
Next injection is not due until 07/05/2016.

## 2016-06-18 NOTE — Telephone Encounter (Signed)
Verification of benefits have been processed and an approval has been received for pts prolia injection. Pts estimated cost are appx $100, along with a $60 deductible if she has not already met it. This is only an estimate and cannot be confirmed until benefits are paid. Please advise pt and schedule if needed. If scheduled, once the injection is received, pls contact me back with the date it was received so that I am able to update prolia folder. thanks    Original form indicated PA required. Spoke with Michel Bickers who states no PA required for any outpatient procedures. Call ref # [id number]

## 2016-06-18 NOTE — Telephone Encounter (Signed)
Called and notified patient, LVM to call back regarding Prolia injection, gave call back number.

## 2016-06-19 ENCOUNTER — Telehealth: Payer: Self-pay

## 2016-06-19 NOTE — Telephone Encounter (Signed)
Called and notified patient of the insurance information for the Prolia, Patient states she wants to start getting those done at her OBGYN office, as it is closer to her. Note about that conversation is in her doctors previous note. Please advise. Thank you!

## 2016-06-19 NOTE — Telephone Encounter (Signed)
Ok, no pb

## 2016-07-10 NOTE — Telephone Encounter (Addendum)
Complete exam TF 24/93/24 calicum 9.4  N99.1  44/45/84. Insurance Medicare primary deductible [phone] (met),  Allowable [id number].  Co insurance 20% [phone], Secondary insurance deductible [phone] [id number] met), balance= $4 OOPM $2248 (244 met), secondary coverage 80% of balance =$170 plus $4 dedu balance. Total approx $174 NO PA needed per Clair Gulling at [employer] due to Medicare primary. LM of VM pt

## 2016-07-12 ENCOUNTER — Encounter: Payer: Self-pay | Admitting: Gynecology

## 2016-07-13 DIAGNOSIS — Z23 Encounter for immunization: Secondary | ICD-10-CM | POA: Diagnosis not present

## 2016-07-13 DIAGNOSIS — I10 Essential (primary) hypertension: Secondary | ICD-10-CM | POA: Diagnosis not present

## 2016-07-13 DIAGNOSIS — E039 Hypothyroidism, unspecified: Secondary | ICD-10-CM | POA: Diagnosis not present

## 2016-07-20 NOTE — Telephone Encounter (Signed)
LM for her to call me

## 2016-07-23 NOTE — Telephone Encounter (Addendum)
LM to call me. Patient called me back I left message again on home . I talked with her on Mobile phone she is concerned about the increased cost. I told her to check on her charge last year and I also explained that cost for Prolia had increased. She will call back and leave a message.[employer] recheck benefits for PA needed by secondary insurance.

## 2016-07-24 ENCOUNTER — Ambulatory Visit (INDEPENDENT_AMBULATORY_CARE_PROVIDER_SITE_OTHER): Payer: Medicare Other | Admitting: Gynecology

## 2016-07-24 DIAGNOSIS — M810 Age-related osteoporosis without current pathological fracture: Secondary | ICD-10-CM

## 2016-07-24 MED ORDER — DENOSUMAB 60 MG/ML ~~LOC~~ SOLN
60.0000 mg | Freq: Once | SUBCUTANEOUS | Status: AC
  Administered 2016-07-24: 60 mg via SUBCUTANEOUS

## 2016-07-24 NOTE — Telephone Encounter (Addendum)
[employer] recheck benefits.  Medicare primary allowable charge Prolia [id number]  $1065. [id number] $19.62. Deductible $183 (met) , 20% co insurance approx $217.00 Secondary coverage NO PA NEEDED  Deductible $281 (met $277) co insurance 20% (43.00 plus $4 total due $47.00  Due date Prolia 07/05/16. Appointment today at 2 30 Nurse only. Next injection due 01/24/17

## 2016-07-25 ENCOUNTER — Encounter: Payer: Self-pay | Admitting: Gynecology

## 2016-08-13 ENCOUNTER — Other Ambulatory Visit: Payer: Self-pay | Admitting: Gynecology

## 2016-08-13 ENCOUNTER — Encounter: Payer: Self-pay | Admitting: Gynecology

## 2016-08-13 ENCOUNTER — Ambulatory Visit (INDEPENDENT_AMBULATORY_CARE_PROVIDER_SITE_OTHER): Payer: Medicare Other | Admitting: Gynecology

## 2016-08-13 VITALS — BP 120/64

## 2016-08-13 DIAGNOSIS — N898 Other specified noninflammatory disorders of vagina: Secondary | ICD-10-CM

## 2016-08-13 DIAGNOSIS — N390 Urinary tract infection, site not specified: Secondary | ICD-10-CM

## 2016-08-13 LAB — URINALYSIS W MICROSCOPIC + REFLEX CULTURE
Bilirubin Urine: NEGATIVE
Casts: NONE SEEN [LPF]
Crystals: NONE SEEN [HPF]
GLUCOSE, UA: NEGATIVE
Nitrite: POSITIVE — AB
Specific Gravity, Urine: 1.015 (ref 1.001–1.035)
YEAST: NONE SEEN [HPF]
pH: 5.5 (ref 5.0–8.0)

## 2016-08-13 LAB — WET PREP FOR TRICH, YEAST, CLUE
Clue Cells Wet Prep HPF POC: NONE SEEN
Trich, Wet Prep: NONE SEEN
YEAST WET PREP: NONE SEEN

## 2016-08-13 MED ORDER — CIPROFLOXACIN HCL 250 MG PO TABS: 250.0000 mg | ORAL_TABLET | Freq: Two times a day (BID) | ORAL | 0 refills | Status: DC

## 2016-08-13 NOTE — Addendum Note (Signed)
Addended by: Nelva Nay on: 08/13/2016 04:15 PM   Modules accepted: Orders

## 2016-08-13 NOTE — Patient Instructions (Signed)
Take antibiotic pill twice daily for 3 days

## 2016-08-13 NOTE — Progress Notes (Signed)
    TITILAYO HAGANS 1930-06-21 [id number]        83 y.o.  G4P4 presents with 3 day history of dysuria, frequency, some urgency. No fever or chills. No low back pain. No nausea vomiting diarrhea constipation. History of UTI years ago. Notes some vaginal burning also no discharge or odor.  Past medical history,surgical history, problem list, medications, allergies, family history and social history were all reviewed and documented in the EPIC chart.  Directed ROS with pertinent positives and negatives documented in the history of present illness/assessment and plan.  Exam: [name] assistant Vitals:   08/13/16 1543  BP: 120/64   General appearance:  Normal Spine without CVA tenderness Abdomen soft nontender without masses guarding rebound Pelvic external BUS vagina with atrophic changes. Mild urethral prolapse noted. No significant discharge. Bimanual without masses or tenderness  Assessment/Plan:  83 y.o. G4P4 with history consistent with UTI. Urinalysis appears somewhat contaminated but does show packed WBCs, 40-60 RBCs and many bacteria. Will cover with ciprofloxacin 250 mg twice a day 3 days. Issues of use of antibiotic in elderly patients reviewed with the patient and accepted. Wet prep was negative. Patient will follow up if her symptoms persist, worsen or recur.  Anastasio Auerbach MD, 4:05 PM 08/13/2016

## 2016-08-16 LAB — URINE CULTURE

## 2016-08-20 DIAGNOSIS — R151 Fecal smearing: Secondary | ICD-10-CM | POA: Diagnosis not present

## 2016-08-28 DIAGNOSIS — Z1231 Encounter for screening mammogram for malignant neoplasm of breast: Secondary | ICD-10-CM | POA: Diagnosis not present

## 2016-08-29 ENCOUNTER — Encounter: Payer: Self-pay | Admitting: Gynecology

## 2016-08-30 DIAGNOSIS — N6323 Unspecified lump in the left breast, lower outer quadrant: Secondary | ICD-10-CM | POA: Diagnosis not present

## 2016-08-30 DIAGNOSIS — N6002 Solitary cyst of left breast: Secondary | ICD-10-CM | POA: Diagnosis not present

## 2016-09-03 ENCOUNTER — Encounter: Payer: Self-pay | Admitting: Gynecology

## 2016-09-12 DIAGNOSIS — H35341 Macular cyst, hole, or pseudohole, right eye: Secondary | ICD-10-CM | POA: Diagnosis not present

## 2016-09-12 DIAGNOSIS — H353132 Nonexudative age-related macular degeneration, bilateral, intermediate dry stage: Secondary | ICD-10-CM | POA: Diagnosis not present

## 2016-11-02 DIAGNOSIS — J029 Acute pharyngitis, unspecified: Secondary | ICD-10-CM | POA: Diagnosis not present

## 2016-12-12 DIAGNOSIS — H43821 Vitreomacular adhesion, right eye: Secondary | ICD-10-CM | POA: Diagnosis not present

## 2016-12-12 DIAGNOSIS — H02831 Dermatochalasis of right upper eyelid: Secondary | ICD-10-CM | POA: Diagnosis not present

## 2016-12-12 DIAGNOSIS — H02832 Dermatochalasis of right lower eyelid: Secondary | ICD-10-CM | POA: Diagnosis not present

## 2016-12-12 DIAGNOSIS — H353132 Nonexudative age-related macular degeneration, bilateral, intermediate dry stage: Secondary | ICD-10-CM | POA: Diagnosis not present

## 2016-12-12 DIAGNOSIS — H524 Presbyopia: Secondary | ICD-10-CM | POA: Diagnosis not present

## 2016-12-12 DIAGNOSIS — H02834 Dermatochalasis of left upper eyelid: Secondary | ICD-10-CM | POA: Diagnosis not present

## 2016-12-17 ENCOUNTER — Encounter (INDEPENDENT_AMBULATORY_CARE_PROVIDER_SITE_OTHER): Payer: Medicare Other | Admitting: Ophthalmology

## 2016-12-17 DIAGNOSIS — H353132 Nonexudative age-related macular degeneration, bilateral, intermediate dry stage: Secondary | ICD-10-CM

## 2016-12-17 DIAGNOSIS — I10 Essential (primary) hypertension: Secondary | ICD-10-CM

## 2016-12-17 DIAGNOSIS — H35033 Hypertensive retinopathy, bilateral: Secondary | ICD-10-CM | POA: Diagnosis not present

## 2016-12-17 DIAGNOSIS — H35341 Macular cyst, hole, or pseudohole, right eye: Secondary | ICD-10-CM | POA: Diagnosis not present

## 2016-12-17 DIAGNOSIS — H43813 Vitreous degeneration, bilateral: Secondary | ICD-10-CM

## 2016-12-17 NOTE — H&P (Signed)
Breanna [name] is an 83 y.o. female.   Chief Complaint:Loss of vision right eye HPI: ten days ago lost vision in right eye    Past Medical History:  Diagnosis Date  . Hypertension   . Hypothyroidism   . Osteoporosis, senile 08/2013   T score -2.8 overall stable to a slight decline from prior DEXA 2013  . Vertigo Nov 2013   Work up at [facility] for stroke-tests negative, no further problem    Past Surgical History:  Procedure Laterality Date  . BACK SURGERY  2006  . CYSTOCELE REPAIR N/A 08/19/2012   Procedure: ANTERIOR REPAIR (CYSTOCELE);  Surgeon: Reece Packer, MD;  Location: [facility];  Service: Urology;  Laterality: N/A;  . CYSTOSCOPY N/A 08/19/2012   Procedure: CYSTOSCOPY;  Surgeon: Reece Packer, MD;  Location: [facility];  Service: Urology;  Laterality: N/A;  . SALPINGOOPHORECTOMY Bilateral 08/19/2012   Procedure: SALPINGO OOPHORECTOMY;  Surgeon: Marvene Staff, MD;  Location: [facility];  Service: Gynecology;  Laterality: Bilateral;  . TONSILLECTOMY    . TUBAL LIGATION    . VAGINAL HYSTERECTOMY N/A 08/19/2012   Procedure: HYSTERECTOMY VAGINAL;  Surgeon: Marvene Staff, MD;  Location: [facility];  Service: Gynecology;  Laterality: N/A;  . VAGINAL PROLAPSE REPAIR N/A 08/19/2012   Procedure: VAULT PROLAPSE WITH GRAFT;  Surgeon: Reece Packer, MD;  Location: [facility];  Service: Urology;  Laterality: N/A;    Family History  Problem Relation Age of Onset  . Stroke Father   . Cancer - Colon Mother    Social History:  reports that she has never smoked. She has never used smokeless tobacco. She reports that she drinks alcohol. She reports that she does not use drugs.  Allergies:  Allergies  Allergen Reactions  . Anesthetics, Amide Nausea And Vomiting    Also causes constipation.   Marland Kitchen Percocet [Oxycodone-Acetaminophen] Nausea And Vomiting  . Sulfa Antibiotics Nausea And Vomiting    No prescriptions prior to admission.    Review of systems otherwise negative  There  were no vitals taken for this visit.  Physical exam: Mental status: oriented x3. Eyes: See eye exam associated with this date of surgery in media tab.  Scanned in by scanning center Ears, Nose, Throat: within normal limits Neck: Within Normal limits General: within normal limits Chest: Within normal limits Breast: deferred Heart: Within normal limits Abdomen: Within normal limits GU: deferred Extremities: within normal limits Skin: within normal limits  Assessment/Plan Macular hole right eye Plan: To [facility] for Pars plana vitrectomy, membrane peel, laser, gas injection, serum patch. Right eye.  Hayden Pedro 12/17/2016, 5:15 PM

## 2016-12-26 ENCOUNTER — Encounter (INDEPENDENT_AMBULATORY_CARE_PROVIDER_SITE_OTHER): Payer: Medicare Other | Admitting: Ophthalmology

## 2016-12-26 DIAGNOSIS — I10 Essential (primary) hypertension: Secondary | ICD-10-CM | POA: Diagnosis not present

## 2016-12-26 DIAGNOSIS — E039 Hypothyroidism, unspecified: Secondary | ICD-10-CM | POA: Diagnosis not present

## 2016-12-26 DIAGNOSIS — Z79899 Other long term (current) drug therapy: Secondary | ICD-10-CM | POA: Diagnosis not present

## 2017-01-01 DIAGNOSIS — H35341 Macular cyst, hole, or pseudohole, right eye: Secondary | ICD-10-CM | POA: Diagnosis not present

## 2017-01-01 DIAGNOSIS — H43822 Vitreomacular adhesion, left eye: Secondary | ICD-10-CM | POA: Diagnosis not present

## 2017-01-01 DIAGNOSIS — H353111 Nonexudative age-related macular degeneration, right eye, early dry stage: Secondary | ICD-10-CM | POA: Diagnosis not present

## 2017-01-01 DIAGNOSIS — H353122 Nonexudative age-related macular degeneration, left eye, intermediate dry stage: Secondary | ICD-10-CM | POA: Diagnosis not present

## 2017-01-03 DIAGNOSIS — E039 Hypothyroidism, unspecified: Secondary | ICD-10-CM | POA: Diagnosis not present

## 2017-01-03 DIAGNOSIS — Z23 Encounter for immunization: Secondary | ICD-10-CM | POA: Diagnosis not present

## 2017-01-03 DIAGNOSIS — M810 Age-related osteoporosis without current pathological fracture: Secondary | ICD-10-CM | POA: Diagnosis not present

## 2017-01-03 DIAGNOSIS — I10 Essential (primary) hypertension: Secondary | ICD-10-CM | POA: Diagnosis not present

## 2017-01-03 DIAGNOSIS — Z0000 Encounter for general adult medical examination without abnormal findings: Secondary | ICD-10-CM | POA: Diagnosis not present

## 2017-01-03 DIAGNOSIS — Z1389 Encounter for screening for other disorder: Secondary | ICD-10-CM | POA: Diagnosis not present

## 2017-01-07 ENCOUNTER — Telehealth: Payer: Self-pay | Admitting: Gynecology

## 2017-01-07 DIAGNOSIS — H35341 Macular cyst, hole, or pseudohole, right eye: Secondary | ICD-10-CM | POA: Diagnosis not present

## 2017-01-07 DIAGNOSIS — H35371 Puckering of macula, right eye: Secondary | ICD-10-CM | POA: Diagnosis not present

## 2017-01-07 NOTE — Telephone Encounter (Signed)
prolia due 01/24/17

## 2017-01-15 ENCOUNTER — Ambulatory Visit: Admit: 2017-01-15 | Payer: Medicare Other | Admitting: Ophthalmology

## 2017-01-15 DIAGNOSIS — H35341 Macular cyst, hole, or pseudohole, right eye: Secondary | ICD-10-CM | POA: Diagnosis not present

## 2017-01-15 DIAGNOSIS — H43822 Vitreomacular adhesion, left eye: Secondary | ICD-10-CM | POA: Diagnosis not present

## 2017-01-15 SURGERY — 25 GAUGE PARS PLANA VITRECTOMY WITH 20 GAUGE MVR PORT FOR MACULAR HOLE
Anesthesia: General | Laterality: Right

## 2017-01-21 DIAGNOSIS — H35341 Macular cyst, hole, or pseudohole, right eye: Secondary | ICD-10-CM | POA: Diagnosis not present

## 2017-01-22 ENCOUNTER — Encounter (INDEPENDENT_AMBULATORY_CARE_PROVIDER_SITE_OTHER): Payer: Medicare Other | Admitting: Ophthalmology

## 2017-01-22 DIAGNOSIS — E039 Hypothyroidism, unspecified: Secondary | ICD-10-CM | POA: Diagnosis not present

## 2017-01-22 DIAGNOSIS — I10 Essential (primary) hypertension: Secondary | ICD-10-CM | POA: Diagnosis not present

## 2017-01-22 DIAGNOSIS — Z79899 Other long term (current) drug therapy: Secondary | ICD-10-CM | POA: Diagnosis not present

## 2017-01-24 NOTE — Telephone Encounter (Addendum)
Pc to pt Prolia due 01/24/17, Calcium 9.5  01/22/17  complete TF 05/2016 Insurance= Deductible $183 (met), 20% co insurance approx $213. Secondary insurance deductible [phone] (met), approx cost  Pt  co insurance 20% approx $46.60.  OOPM [id number]  ($368.76 met)/ Scheduled for 01/30/17  At 11 am Nurse only.

## 2017-01-29 ENCOUNTER — Encounter: Payer: Self-pay | Admitting: Gynecology

## 2017-01-30 ENCOUNTER — Ambulatory Visit (INDEPENDENT_AMBULATORY_CARE_PROVIDER_SITE_OTHER): Payer: Medicare Other | Admitting: Gynecology

## 2017-01-30 DIAGNOSIS — M810 Age-related osteoporosis without current pathological fracture: Secondary | ICD-10-CM

## 2017-01-30 MED ORDER — DENOSUMAB 60 MG/ML ~~LOC~~ SOLN
60.0000 mg | Freq: Once | SUBCUTANEOUS | Status: AC
  Administered 2017-01-30: 60 mg via SUBCUTANEOUS

## 2017-02-05 DIAGNOSIS — H35341 Macular cyst, hole, or pseudohole, right eye: Secondary | ICD-10-CM | POA: Diagnosis not present

## 2017-02-06 NOTE — Telephone Encounter (Signed)
Prolia given 01/30/17  Next due after 07/31/17

## 2017-02-13 ENCOUNTER — Ambulatory Visit (INDEPENDENT_AMBULATORY_CARE_PROVIDER_SITE_OTHER): Payer: Medicare Other | Admitting: Ophthalmology

## 2017-02-19 DIAGNOSIS — H0102A Squamous blepharitis right eye, upper and lower eyelids: Secondary | ICD-10-CM | POA: Diagnosis not present

## 2017-02-19 DIAGNOSIS — H0102B Squamous blepharitis left eye, upper and lower eyelids: Secondary | ICD-10-CM | POA: Diagnosis not present

## 2017-02-27 DIAGNOSIS — H353132 Nonexudative age-related macular degeneration, bilateral, intermediate dry stage: Secondary | ICD-10-CM | POA: Diagnosis not present

## 2017-02-27 DIAGNOSIS — H02831 Dermatochalasis of right upper eyelid: Secondary | ICD-10-CM | POA: Diagnosis not present

## 2017-02-27 DIAGNOSIS — H02835 Dermatochalasis of left lower eyelid: Secondary | ICD-10-CM | POA: Diagnosis not present

## 2017-02-27 DIAGNOSIS — H02832 Dermatochalasis of right lower eyelid: Secondary | ICD-10-CM | POA: Diagnosis not present

## 2017-02-27 DIAGNOSIS — H02834 Dermatochalasis of left upper eyelid: Secondary | ICD-10-CM | POA: Diagnosis not present

## 2017-03-18 DIAGNOSIS — H02831 Dermatochalasis of right upper eyelid: Secondary | ICD-10-CM | POA: Diagnosis not present

## 2017-03-18 DIAGNOSIS — H02832 Dermatochalasis of right lower eyelid: Secondary | ICD-10-CM | POA: Diagnosis not present

## 2017-03-18 DIAGNOSIS — H02834 Dermatochalasis of left upper eyelid: Secondary | ICD-10-CM | POA: Diagnosis not present

## 2017-03-18 DIAGNOSIS — H35341 Macular cyst, hole, or pseudohole, right eye: Secondary | ICD-10-CM | POA: Diagnosis not present

## 2017-03-19 DIAGNOSIS — E039 Hypothyroidism, unspecified: Secondary | ICD-10-CM | POA: Diagnosis not present

## 2017-04-01 DIAGNOSIS — H5711 Ocular pain, right eye: Secondary | ICD-10-CM | POA: Diagnosis not present

## 2017-04-01 DIAGNOSIS — H01001 Unspecified blepharitis right upper eyelid: Secondary | ICD-10-CM | POA: Diagnosis not present

## 2017-04-01 DIAGNOSIS — H01004 Unspecified blepharitis left upper eyelid: Secondary | ICD-10-CM | POA: Diagnosis not present

## 2017-04-01 DIAGNOSIS — H01005 Unspecified blepharitis left lower eyelid: Secondary | ICD-10-CM | POA: Diagnosis not present

## 2017-04-01 DIAGNOSIS — H01002 Unspecified blepharitis right lower eyelid: Secondary | ICD-10-CM | POA: Diagnosis not present

## 2017-05-06 DIAGNOSIS — H01004 Unspecified blepharitis left upper eyelid: Secondary | ICD-10-CM | POA: Diagnosis not present

## 2017-05-06 DIAGNOSIS — H5711 Ocular pain, right eye: Secondary | ICD-10-CM | POA: Diagnosis not present

## 2017-05-06 DIAGNOSIS — H02052 Trichiasis without entropian right lower eyelid: Secondary | ICD-10-CM | POA: Diagnosis not present

## 2017-05-06 DIAGNOSIS — H01001 Unspecified blepharitis right upper eyelid: Secondary | ICD-10-CM | POA: Diagnosis not present

## 2017-05-06 DIAGNOSIS — H01002 Unspecified blepharitis right lower eyelid: Secondary | ICD-10-CM | POA: Diagnosis not present

## 2017-06-06 ENCOUNTER — Encounter: Payer: Medicare Other | Admitting: Gynecology

## 2017-06-07 DIAGNOSIS — H353114 Nonexudative age-related macular degeneration, right eye, advanced atrophic with subfoveal involvement: Secondary | ICD-10-CM | POA: Diagnosis not present

## 2017-06-07 DIAGNOSIS — H43812 Vitreous degeneration, left eye: Secondary | ICD-10-CM | POA: Diagnosis not present

## 2017-06-07 DIAGNOSIS — H43822 Vitreomacular adhesion, left eye: Secondary | ICD-10-CM | POA: Diagnosis not present

## 2017-06-07 DIAGNOSIS — H353122 Nonexudative age-related macular degeneration, left eye, intermediate dry stage: Secondary | ICD-10-CM | POA: Diagnosis not present

## 2017-06-11 DIAGNOSIS — E039 Hypothyroidism, unspecified: Secondary | ICD-10-CM | POA: Diagnosis not present

## 2017-06-14 DIAGNOSIS — L814 Other melanin hyperpigmentation: Secondary | ICD-10-CM | POA: Diagnosis not present

## 2017-06-14 DIAGNOSIS — I10 Essential (primary) hypertension: Secondary | ICD-10-CM | POA: Diagnosis not present

## 2017-06-14 DIAGNOSIS — D1801 Hemangioma of skin and subcutaneous tissue: Secondary | ICD-10-CM | POA: Diagnosis not present

## 2017-06-14 DIAGNOSIS — L649 Androgenic alopecia, unspecified: Secondary | ICD-10-CM | POA: Diagnosis not present

## 2017-06-14 DIAGNOSIS — L821 Other seborrheic keratosis: Secondary | ICD-10-CM | POA: Diagnosis not present

## 2017-06-14 DIAGNOSIS — D692 Other nonthrombocytopenic purpura: Secondary | ICD-10-CM | POA: Diagnosis not present

## 2017-06-14 DIAGNOSIS — D2261 Melanocytic nevi of right upper limb, including shoulder: Secondary | ICD-10-CM | POA: Diagnosis not present

## 2017-06-14 DIAGNOSIS — L570 Actinic keratosis: Secondary | ICD-10-CM | POA: Diagnosis not present

## 2017-06-14 DIAGNOSIS — D485 Neoplasm of uncertain behavior of skin: Secondary | ICD-10-CM | POA: Diagnosis not present

## 2017-06-21 ENCOUNTER — Encounter: Payer: Medicare Other | Admitting: Gynecology

## 2017-06-24 DIAGNOSIS — J019 Acute sinusitis, unspecified: Secondary | ICD-10-CM | POA: Diagnosis not present

## 2017-07-01 ENCOUNTER — Other Ambulatory Visit: Payer: Self-pay | Admitting: Geriatric Medicine

## 2017-07-01 ENCOUNTER — Ambulatory Visit
Admission: RE | Admit: 2017-07-01 | Discharge: 2017-07-01 | Disposition: A | Discharge status: Home / Self Care | Payer: Medicare Other | Source: Ambulatory Visit | Attending: Geriatric Medicine | Admitting: Geriatric Medicine

## 2017-07-01 DIAGNOSIS — R059 Cough, unspecified: Secondary | ICD-10-CM

## 2017-07-01 DIAGNOSIS — R05 Cough: Secondary | ICD-10-CM

## 2017-07-01 DIAGNOSIS — J0100 Acute maxillary sinusitis, unspecified: Secondary | ICD-10-CM | POA: Diagnosis not present

## 2017-07-04 ENCOUNTER — Encounter: Payer: Self-pay | Admitting: Gynecology

## 2017-07-04 ENCOUNTER — Telehealth: Payer: Self-pay | Admitting: *Deleted

## 2017-07-04 NOTE — Telephone Encounter (Addendum)
Deductible $185 ($185 met)  OOP MAX Met  Secondary insurance [employer]  Deductible [phone] met OOP [phone] met)  Annual exam up coming 07/30/17  Calcium 9.5      Date 01/22/17  Upcoming dental procedures NO  Prior Authorization needed NO  Pt estimated Cost $ 222  New Summary of benefits scanned in system   appt 09/11/17 @ 10:30  Coverage Details: 20% One dose and 20% admin fee

## 2017-07-18 ENCOUNTER — Encounter: Payer: Medicare Other | Admitting: Gynecology

## 2017-07-18 NOTE — Telephone Encounter (Signed)
Left message for pt to return my call.

## 2017-07-24 NOTE — Telephone Encounter (Signed)
Pt has upcoming appt that will go towards her deductible. Pt request for me to run SOB on June 10 th

## 2017-07-29 DIAGNOSIS — H01005 Unspecified blepharitis left lower eyelid: Secondary | ICD-10-CM | POA: Diagnosis not present

## 2017-07-29 DIAGNOSIS — H01002 Unspecified blepharitis right lower eyelid: Secondary | ICD-10-CM | POA: Diagnosis not present

## 2017-07-29 DIAGNOSIS — H01001 Unspecified blepharitis right upper eyelid: Secondary | ICD-10-CM | POA: Diagnosis not present

## 2017-07-29 DIAGNOSIS — H01004 Unspecified blepharitis left upper eyelid: Secondary | ICD-10-CM | POA: Diagnosis not present

## 2017-07-30 ENCOUNTER — Encounter: Payer: Medicare Other | Admitting: Gynecology

## 2017-08-28 ENCOUNTER — Ambulatory Visit (INDEPENDENT_AMBULATORY_CARE_PROVIDER_SITE_OTHER): Payer: Medicare Other | Admitting: Gynecology

## 2017-08-28 ENCOUNTER — Encounter: Payer: Self-pay | Admitting: Gynecology

## 2017-08-28 VITALS — BP 118/78 | Ht 60.0 in | Wt 123.0 lb

## 2017-08-28 DIAGNOSIS — M810 Age-related osteoporosis without current pathological fracture: Secondary | ICD-10-CM

## 2017-08-28 DIAGNOSIS — N368 Other specified disorders of urethra: Secondary | ICD-10-CM

## 2017-08-28 DIAGNOSIS — Z01411 Encounter for gynecological examination (general) (routine) with abnormal findings: Secondary | ICD-10-CM | POA: Diagnosis not present

## 2017-08-28 DIAGNOSIS — N952 Postmenopausal atrophic vaginitis: Secondary | ICD-10-CM

## 2017-08-28 NOTE — Telephone Encounter (Signed)
Pt had visit with TF, while here she asked me to run her summary of benefits.  Were waiting on the new summary of benefits pt states she has met ded

## 2017-08-28 NOTE — Progress Notes (Signed)
    Breanna [name] 09-01-1930 [id number]        84 y.o.  G4P4 for breast and pelvic exam  Past medical history,surgical history, problem list, medications, allergies, family history and social history were all reviewed and documented as reviewed in the EPIC chart.  ROS:  Performed with pertinent positives and negatives included in the history, assessment and plan.   Additional significant findings : None   Exam: [name] assistant Vitals:   08/28/17 0935  BP: 118/78  Weight: 123 lb (55.8 kg)  Height: 5' (1.524 m)   Body mass index is 24.02 kg/m.  General appearance:  Normal affect, orientation and appearance. Skin: Grossly normal HEENT: Without gross lesions.  No cervical or supraclavicular adenopathy. Thyroid normal.  Lungs:  Clear without wheezing, rales or rhonchi Cardiac: RR, without RMG Abdominal:  Soft, nontender, without masses, guarding, rebound, organomegaly or hernia Breasts:  Examined lying and sitting without masses, retractions, discharge or axillary adenopathy. Pelvic:  Ext, BUS, Vagina: With atrophic changes.  Classic mild urethral prolapse noted asymptomatic to the patient.  Cuff well supported.  Adnexa: Without masses or tenderness    Anus and perineum: Normal   Rectovaginal: Normal sphincter tone without palpated masses or tenderness.    Assessment/Plan:  84 y.o. G28P4 female for breast and pelvic exam.   1. Postmenopausal/atrophic genital changes.  No significant hot flushes, night sweats or vaginal dryness. 2. History of TVH/BSO with anterior colporrhaphy and vaginal vault graft suspension.  Subsequently had robotic sacral colpopexy.  She doing well with exam showing good support. 3. Osteoporosis.  Saw Dr. Cruzita Lederer and initiated on Prolia last year.  Doing well with this.  Will continue on Prolia.  DEXA 2017 T score -3.4.  Recommended follow-up DEXA this coming fall at a 2-year interval and she will go ahead make arrangements for this. 4. Mammography  scheduled and she will follow-up for this.  Breast exam normal today. 5. Pap smear 2011.  No Pap smear done today.  No history of significant abnormal Pap smears.  We both agreed to stop screening per current screening guidelines based on age and hysterectomy history. 6. Colonoscopy 2007.  Will follow-up for colon screening per primary physician recommendations. 7. Health maintenance.  No routine lab work done as patient does this elsewhere.  Follow-up 1 year, sooner as needed.   Anastasio Auerbach MD, 10:06 AM 08/28/2017

## 2017-08-28 NOTE — Patient Instructions (Signed)
Follow-up for bone density as scheduled this coming fall.  Continue on your Prolia as arranged.

## 2017-08-29 ENCOUNTER — Encounter: Payer: Self-pay | Admitting: Gynecology

## 2017-09-11 ENCOUNTER — Ambulatory Visit (INDEPENDENT_AMBULATORY_CARE_PROVIDER_SITE_OTHER): Payer: Medicare Other | Admitting: Gynecology

## 2017-09-11 DIAGNOSIS — M810 Age-related osteoporosis without current pathological fracture: Secondary | ICD-10-CM

## 2017-09-11 MED ORDER — DENOSUMAB 60 MG/ML ~~LOC~~ SOSY
60.0000 mg | PREFILLED_SYRINGE | Freq: Once | SUBCUTANEOUS | Status: AC
  Administered 2017-09-11: 60 mg via SUBCUTANEOUS

## 2017-09-12 NOTE — Telephone Encounter (Signed)
PROLIA GIVEN 09/11/17 NEXT INJECTION 03/14/18

## 2017-09-19 ENCOUNTER — Encounter: Payer: Self-pay | Admitting: Gynecology

## 2017-09-24 ENCOUNTER — Ambulatory Visit: Payer: Self-pay

## 2017-09-24 ENCOUNTER — Ambulatory Visit (INDEPENDENT_AMBULATORY_CARE_PROVIDER_SITE_OTHER): Payer: Medicare Other | Admitting: Physician Assistant

## 2017-09-24 ENCOUNTER — Other Ambulatory Visit: Payer: Self-pay

## 2017-09-24 ENCOUNTER — Encounter: Payer: Self-pay | Admitting: Physician Assistant

## 2017-09-24 ENCOUNTER — Ambulatory Visit: Payer: Medicare Other | Admitting: Physician Assistant

## 2017-09-24 VITALS — BP 128/72 | HR 76 | Temp 98.0°F | Resp 16 | Ht 60.0 in | Wt 123.4 lb

## 2017-09-24 DIAGNOSIS — T24111A Burn of first degree of right thigh, initial encounter: Secondary | ICD-10-CM | POA: Diagnosis not present

## 2017-09-24 MED ORDER — CHLORHEXIDINE GLUCONATE 2 % EX SOLN: 1.0000 "application " | Freq: Two times a day (BID) | CUTANEOUS | 0 refills | Status: AC

## 2017-09-24 NOTE — Progress Notes (Signed)
Breanna [name]  MRN: [id number] DOB: 28-May-1930  PCP: Lajean Manes, MD  Subjective:  Pt is an 84 year old female who presents to clinic for burn on her thigh after spilling hot water.  She endorses a very large blister on her right thigh.  She has not popped it.  She has been keeping it covered and cleaning it with soap and water.  Review of Systems  Constitutional: Negative for chills, diaphoresis, fatigue and fever.  Gastrointestinal: Negative for abdominal pain, nausea and vomiting.  Musculoskeletal: Negative for arthralgias and joint swelling.  Skin: Positive for wound.    Patient Active Problem List   Diagnosis Date Noted  . Cervical spinal stenosis 02/18/2012  . Cerebrovascular disease 02/18/2012  . Vertigo 02/17/2012  . Hypertension 02/17/2012  . Hypothyroid 02/17/2012  . Osteoporosis 02/17/2012    Current Outpatient Medications on File Prior to Visit  Medication Sig Dispense Refill  . acetaminophen (TYLENOL) 500 MG tablet Take 1,000 mg by mouth every 6 (six) hours as needed for mild pain.    Marland Kitchen aspirin EC 81 MG tablet Take 81 mg by mouth daily.    . Calcium-Vitamin D (CALTRATE 600 PLUS-VIT D PO) Take 1 tablet by mouth 2 (two) times daily.    . Cholecalciferol 1000 UNITS tablet Take 1,000 Units by mouth daily.    Marland Kitchen conjugated estrogens (PREMARIN) vaginal cream Place 1 Applicatorful vaginally daily.    Marland Kitchen denosumab (PROLIA) 60 MG/ML SOSY injection Inject into the skin.    . felodipine (PLENDIL) 10 MG 24 hr tablet Take 5 mg by mouth daily.     Marland Kitchen levothyroxine (SYNTHROID, LEVOTHROID) 88 MCG tablet Take 88 mcg by mouth daily before breakfast.    . Multiple Vitamins-Minerals (MH MACULAR HEALTH) MISC Take 1 tablet by mouth daily.    Vladimir Faster Glycol-Propyl Glycol (SYSTANE OP) Apply 2 drops to eye daily as needed (for dry eyes.).    Marland Kitchen polyethylene glycol (MIRALAX / GLYCOLAX) packet Take 17 g by mouth daily.    Marland Kitchen lisinopril (PRINIVIL,ZESTRIL) 10 MG tablet Take by  mouth.     Current Facility-Administered Medications on File Prior to Visit  Medication Dose Route Frequency Provider Last Rate Last Dose  . denosumab (PROLIA) injection 60 mg  60 mg Subcutaneous Once Elayne Snare, MD        Allergies  Allergen Reactions  . Anesthetics, Amide Nausea And Vomiting    Also causes constipation.   Marland Kitchen Percocet [Oxycodone-Acetaminophen] Nausea And Vomiting  . Sulfa Antibiotics Nausea And Vomiting     Objective:  BP 128/72   Pulse 76   Temp 98 F (36.7 C)   Resp 16   Ht 5' (1.524 m)   Wt 123 lb 6.4 oz (56 kg)   SpO2 98%   BMI 24.10 kg/m   Physical Exam  Constitutional: She is oriented to person, place, and time. No distress.  Neurological: She is alert and oriented to person, place, and time.  Skin: Skin is warm and dry. Burn (superficial with blister.) noted.     Psychiatric: Judgment normal.  Vitals reviewed.     Assessment and Plan :  1. Superficial burn of right thigh, initial encounter -Patient complains of burn after spilling hot water on her right thigh.  No red flags.  Appears to be superficial with very large blister.  Burn care discussed.  Return to clinic if symptoms worsen. - Chlorhexidine Gluconate 2 % SOLN; Apply 1 application topically 2 (two) times daily for 5 days.  Dispense: 60 mL; Refill: 0   Whitney Tyland Klemens, PA-C  [facility] 09/24/2017 4:42 PM

## 2017-09-24 NOTE — Patient Instructions (Addendum)
-  Wash your burn daily with mild soap and water during dressing changes. Chlorhexidine wash (without alcohol) is also effective for cleaning burn wounds. (do not buy this if it is expensive. See next line) - You can also apply aloe vera or a basic topical antibiotic (like bacitracin) to superficial burns. Both are inexpensive, and aloe vera provides some antibacterial activity - Do not pop your blister.  - Peeling skin, including ruptured blisters, should be debrided before applying a dressing. Lightly scrub the area with a clean cloth.   - Superficial burns do not require dressings. However, if you feel that you would prefer to wear a bandage, a basic gauze dressing provides adequate burn coverage. It is placed after the application of topical antibiotic and consists of a first layer of nonadherent gauze (eg, Adaptic or Xeroform) placed over the burn, a second layer of fluffed dry gauze, and an outer layer of an elastic gauze roll (eg, Kerlix).   - Itching is a common problem during the healing process. It is often triggered or worsened by environmental extremes (especially heat), physical activity, and stress.  - come back if you are not improving.   Thank you for coming in today. I hope you feel we met your needs.  Feel free to call PCP if you have any questions or further requests.  Please consider signing up for MyChart if you do not already have it, as this is a great way to communicate with me.  Best,  [name], PA-C  IF you received an x-ray today, you will receive an invoice from [facility]. Please contact [facility] at [phone] with questions or concerns regarding your invoice.   IF you received labwork today, you will receive an invoice from [facility]. Please contact [facility] at [phone] with questions or concerns regarding your invoice.   Our billing staff will not be able to assist you with questions regarding bills from these companies.  You  will be contacted with the lab results as soon as they are available. The fastest way to get your results is to activate your My Chart account. Instructions are located on the last page of this paperwork. If you have not heard from [place] regarding the results in 2 weeks, please contact this office.

## 2017-09-24 NOTE — Telephone Encounter (Addendum)
Saturday, pt was boiling eggs on the stove and as she went to the sink to drain the hot water, some of the water splashed on her right upper leg. Area affected is 6-8 inches by 4 inches and rectangular in shape. The blister is 2 inches by 4 inches. The blister has opened and is starting to drain fluid. Pt denies pain.  Care advice given with instruction to call doctor for any redness, swelling or drainage. Appt made with [name] today at 4:20. Called PCP office and ok  For pt to come to office instead of ED per Almyra Free. Reason for Disposition . [1] Blister (intact or ruptured) AND [2] larger than 2 inches (5 cm)  Answer Assessment - Initial Assessment Questions 1. ONSET: "When did it happen?" If happened < 10 minutes ago, ask: "Did you apply cold water?" If not, give First Aid Advice immediately.      [place] 2. LOCATION: "Where is the burn located?"      Right upper leg 3. BURN SIZE: "How large is the burn?"  The palm is roughly 1% of the total body surface area (BSA).     6-8 inches by 4 inches 4. SEVERITY OF THE BURN: "Are there any blisters?"      Yes 2 x 4 inches 5. MECHANISM: "Tell me how it happened."     Was carrying a pot of hot water (boiling eggs) and the pot hit the sink and the hot water splashed her right upper leg.  6. PAIN: "Are you having any pain?" "How bad is the pain?" (Scale 1-10; or mild, moderate, severe)   - MILD (1-3): doesn't interfere with normal activities    - MODERATE (4-7): interferes with normal activities or awakens from sleep    - SEVERE (8-10): excruciating pain, unable to do any normal activities      No pain 7. INHALATION INJURY: "Were you exposed to any smoke or fumes?" If yes: "Do you have any cough or difficulty breathing?"     no 8. OTHER SYMPTOMS: "Do you have any other symptoms?" (e.g., headache, nausea)     no 9. PREGNANCY: "Is there any chance you are pregnant?" "When was your last menstrual period?"     n/a  Protocols used: BURNS -  [facility]

## 2017-09-25 DIAGNOSIS — Z1231 Encounter for screening mammogram for malignant neoplasm of breast: Secondary | ICD-10-CM | POA: Diagnosis not present

## 2017-09-26 ENCOUNTER — Encounter: Payer: Self-pay | Admitting: Gynecology

## 2017-09-26 DIAGNOSIS — M5441 Lumbago with sciatica, right side: Secondary | ICD-10-CM | POA: Diagnosis not present

## 2017-09-27 DIAGNOSIS — T24211A Burn of second degree of right thigh, initial encounter: Secondary | ICD-10-CM | POA: Diagnosis not present

## 2017-09-28 ENCOUNTER — Ambulatory Visit: Payer: Medicare Other | Admitting: Family Medicine

## 2017-09-30 DIAGNOSIS — T24211D Burn of second degree of right thigh, subsequent encounter: Secondary | ICD-10-CM | POA: Diagnosis not present

## 2017-10-09 DIAGNOSIS — H353114 Nonexudative age-related macular degeneration, right eye, advanced atrophic with subfoveal involvement: Secondary | ICD-10-CM | POA: Diagnosis not present

## 2017-10-09 DIAGNOSIS — H43812 Vitreous degeneration, left eye: Secondary | ICD-10-CM | POA: Diagnosis not present

## 2017-10-09 DIAGNOSIS — H353122 Nonexudative age-related macular degeneration, left eye, intermediate dry stage: Secondary | ICD-10-CM | POA: Diagnosis not present

## 2017-10-09 DIAGNOSIS — H43822 Vitreomacular adhesion, left eye: Secondary | ICD-10-CM | POA: Diagnosis not present

## 2017-10-10 ENCOUNTER — Encounter: Payer: Self-pay | Admitting: Gynecology

## 2017-10-11 DIAGNOSIS — M545 Low back pain: Secondary | ICD-10-CM | POA: Diagnosis not present

## 2017-10-11 DIAGNOSIS — M47896 Other spondylosis, lumbar region: Secondary | ICD-10-CM | POA: Diagnosis not present

## 2017-10-11 DIAGNOSIS — R0789 Other chest pain: Secondary | ICD-10-CM | POA: Diagnosis not present

## 2017-10-11 DIAGNOSIS — T300 Burn of unspecified body region, unspecified degree: Secondary | ICD-10-CM | POA: Diagnosis not present

## 2017-10-11 DIAGNOSIS — I10 Essential (primary) hypertension: Secondary | ICD-10-CM | POA: Diagnosis not present

## 2017-10-11 DIAGNOSIS — M5416 Radiculopathy, lumbar region: Secondary | ICD-10-CM | POA: Diagnosis not present

## 2017-10-15 DIAGNOSIS — M47896 Other spondylosis, lumbar region: Secondary | ICD-10-CM | POA: Diagnosis not present

## 2017-10-15 DIAGNOSIS — M5416 Radiculopathy, lumbar region: Secondary | ICD-10-CM | POA: Diagnosis not present

## 2017-10-15 DIAGNOSIS — M545 Low back pain: Secondary | ICD-10-CM | POA: Diagnosis not present

## 2017-10-17 DIAGNOSIS — M47896 Other spondylosis, lumbar region: Secondary | ICD-10-CM | POA: Diagnosis not present

## 2017-10-17 DIAGNOSIS — M545 Low back pain: Secondary | ICD-10-CM | POA: Diagnosis not present

## 2017-10-17 DIAGNOSIS — M5416 Radiculopathy, lumbar region: Secondary | ICD-10-CM | POA: Diagnosis not present

## 2017-10-22 DIAGNOSIS — M5416 Radiculopathy, lumbar region: Secondary | ICD-10-CM | POA: Diagnosis not present

## 2017-10-22 DIAGNOSIS — M545 Low back pain: Secondary | ICD-10-CM | POA: Diagnosis not present

## 2017-10-22 DIAGNOSIS — M47896 Other spondylosis, lumbar region: Secondary | ICD-10-CM | POA: Diagnosis not present

## 2017-10-24 DIAGNOSIS — M5416 Radiculopathy, lumbar region: Secondary | ICD-10-CM | POA: Diagnosis not present

## 2017-10-24 DIAGNOSIS — M545 Low back pain: Secondary | ICD-10-CM | POA: Diagnosis not present

## 2017-10-24 DIAGNOSIS — M47896 Other spondylosis, lumbar region: Secondary | ICD-10-CM | POA: Diagnosis not present

## 2017-10-29 DIAGNOSIS — M545 Low back pain: Secondary | ICD-10-CM | POA: Diagnosis not present

## 2017-10-29 DIAGNOSIS — M47896 Other spondylosis, lumbar region: Secondary | ICD-10-CM | POA: Diagnosis not present

## 2017-10-29 DIAGNOSIS — M5416 Radiculopathy, lumbar region: Secondary | ICD-10-CM | POA: Diagnosis not present

## 2017-10-31 DIAGNOSIS — M5416 Radiculopathy, lumbar region: Secondary | ICD-10-CM | POA: Diagnosis not present

## 2017-10-31 DIAGNOSIS — M545 Low back pain: Secondary | ICD-10-CM | POA: Diagnosis not present

## 2017-10-31 DIAGNOSIS — M47896 Other spondylosis, lumbar region: Secondary | ICD-10-CM | POA: Diagnosis not present

## 2017-11-05 DIAGNOSIS — M47896 Other spondylosis, lumbar region: Secondary | ICD-10-CM | POA: Diagnosis not present

## 2017-11-05 DIAGNOSIS — M5416 Radiculopathy, lumbar region: Secondary | ICD-10-CM | POA: Diagnosis not present

## 2017-11-05 DIAGNOSIS — M545 Low back pain: Secondary | ICD-10-CM | POA: Diagnosis not present

## 2017-11-07 DIAGNOSIS — M545 Low back pain: Secondary | ICD-10-CM | POA: Diagnosis not present

## 2017-11-07 DIAGNOSIS — M47896 Other spondylosis, lumbar region: Secondary | ICD-10-CM | POA: Diagnosis not present

## 2017-11-07 DIAGNOSIS — M5416 Radiculopathy, lumbar region: Secondary | ICD-10-CM | POA: Diagnosis not present

## 2017-11-18 DIAGNOSIS — M47896 Other spondylosis, lumbar region: Secondary | ICD-10-CM | POA: Diagnosis not present

## 2017-11-18 DIAGNOSIS — M545 Low back pain: Secondary | ICD-10-CM | POA: Diagnosis not present

## 2017-11-18 DIAGNOSIS — M5416 Radiculopathy, lumbar region: Secondary | ICD-10-CM | POA: Diagnosis not present

## 2017-11-19 DIAGNOSIS — M545 Low back pain: Secondary | ICD-10-CM | POA: Diagnosis not present

## 2017-11-19 DIAGNOSIS — M5441 Lumbago with sciatica, right side: Secondary | ICD-10-CM | POA: Diagnosis not present

## 2017-11-20 DIAGNOSIS — M545 Low back pain: Secondary | ICD-10-CM | POA: Diagnosis not present

## 2017-11-20 DIAGNOSIS — M5416 Radiculopathy, lumbar region: Secondary | ICD-10-CM | POA: Diagnosis not present

## 2017-11-20 DIAGNOSIS — M47896 Other spondylosis, lumbar region: Secondary | ICD-10-CM | POA: Diagnosis not present

## 2017-11-26 ENCOUNTER — Ambulatory Visit (INDEPENDENT_AMBULATORY_CARE_PROVIDER_SITE_OTHER): Payer: Medicare Other

## 2017-11-26 ENCOUNTER — Other Ambulatory Visit: Payer: Self-pay | Admitting: Gynecology

## 2017-11-26 DIAGNOSIS — M810 Age-related osteoporosis without current pathological fracture: Secondary | ICD-10-CM | POA: Diagnosis not present

## 2017-11-26 DIAGNOSIS — M47896 Other spondylosis, lumbar region: Secondary | ICD-10-CM | POA: Diagnosis not present

## 2017-11-26 DIAGNOSIS — M5416 Radiculopathy, lumbar region: Secondary | ICD-10-CM | POA: Diagnosis not present

## 2017-11-26 DIAGNOSIS — M545 Low back pain: Secondary | ICD-10-CM | POA: Diagnosis not present

## 2017-11-28 DIAGNOSIS — M545 Low back pain: Secondary | ICD-10-CM | POA: Diagnosis not present

## 2017-11-28 DIAGNOSIS — M47896 Other spondylosis, lumbar region: Secondary | ICD-10-CM | POA: Diagnosis not present

## 2017-11-28 DIAGNOSIS — M5416 Radiculopathy, lumbar region: Secondary | ICD-10-CM | POA: Diagnosis not present

## 2017-12-03 ENCOUNTER — Encounter: Payer: Self-pay | Admitting: Gynecology

## 2017-12-03 DIAGNOSIS — M5416 Radiculopathy, lumbar region: Secondary | ICD-10-CM | POA: Diagnosis not present

## 2017-12-03 DIAGNOSIS — M545 Low back pain: Secondary | ICD-10-CM | POA: Diagnosis not present

## 2017-12-03 DIAGNOSIS — M47896 Other spondylosis, lumbar region: Secondary | ICD-10-CM | POA: Diagnosis not present

## 2017-12-05 DIAGNOSIS — M5416 Radiculopathy, lumbar region: Secondary | ICD-10-CM | POA: Diagnosis not present

## 2017-12-05 DIAGNOSIS — M47896 Other spondylosis, lumbar region: Secondary | ICD-10-CM | POA: Diagnosis not present

## 2017-12-05 DIAGNOSIS — M545 Low back pain: Secondary | ICD-10-CM | POA: Diagnosis not present

## 2017-12-10 DIAGNOSIS — M5416 Radiculopathy, lumbar region: Secondary | ICD-10-CM | POA: Diagnosis not present

## 2017-12-10 DIAGNOSIS — M47896 Other spondylosis, lumbar region: Secondary | ICD-10-CM | POA: Diagnosis not present

## 2017-12-10 DIAGNOSIS — M545 Low back pain: Secondary | ICD-10-CM | POA: Diagnosis not present

## 2017-12-30 DIAGNOSIS — Z23 Encounter for immunization: Secondary | ICD-10-CM | POA: Diagnosis not present

## 2017-12-31 DIAGNOSIS — M1711 Unilateral primary osteoarthritis, right knee: Secondary | ICD-10-CM | POA: Diagnosis not present

## 2018-01-07 DIAGNOSIS — H353114 Nonexudative age-related macular degeneration, right eye, advanced atrophic with subfoveal involvement: Secondary | ICD-10-CM | POA: Diagnosis not present

## 2018-01-07 DIAGNOSIS — H353122 Nonexudative age-related macular degeneration, left eye, intermediate dry stage: Secondary | ICD-10-CM | POA: Diagnosis not present

## 2018-01-08 DIAGNOSIS — E039 Hypothyroidism, unspecified: Secondary | ICD-10-CM | POA: Diagnosis not present

## 2018-01-08 DIAGNOSIS — M810 Age-related osteoporosis without current pathological fracture: Secondary | ICD-10-CM | POA: Diagnosis not present

## 2018-01-08 DIAGNOSIS — Z79899 Other long term (current) drug therapy: Secondary | ICD-10-CM | POA: Diagnosis not present

## 2018-01-08 DIAGNOSIS — Z1389 Encounter for screening for other disorder: Secondary | ICD-10-CM | POA: Diagnosis not present

## 2018-01-08 DIAGNOSIS — Z23 Encounter for immunization: Secondary | ICD-10-CM | POA: Diagnosis not present

## 2018-01-08 DIAGNOSIS — I10 Essential (primary) hypertension: Secondary | ICD-10-CM | POA: Diagnosis not present

## 2018-01-08 DIAGNOSIS — Z0000 Encounter for general adult medical examination without abnormal findings: Secondary | ICD-10-CM | POA: Diagnosis not present

## 2018-01-15 DIAGNOSIS — Z79899 Other long term (current) drug therapy: Secondary | ICD-10-CM | POA: Diagnosis not present

## 2018-01-16 DIAGNOSIS — L820 Inflamed seborrheic keratosis: Secondary | ICD-10-CM | POA: Diagnosis not present

## 2018-01-16 DIAGNOSIS — L821 Other seborrheic keratosis: Secondary | ICD-10-CM | POA: Diagnosis not present

## 2018-01-16 DIAGNOSIS — D485 Neoplasm of uncertain behavior of skin: Secondary | ICD-10-CM | POA: Diagnosis not present

## 2018-01-27 ENCOUNTER — Telehealth: Payer: Self-pay | Admitting: *Deleted

## 2018-01-27 DIAGNOSIS — H35341 Macular cyst, hole, or pseudohole, right eye: Secondary | ICD-10-CM | POA: Diagnosis not present

## 2018-01-27 DIAGNOSIS — H01001 Unspecified blepharitis right upper eyelid: Secondary | ICD-10-CM | POA: Diagnosis not present

## 2018-01-27 DIAGNOSIS — H01005 Unspecified blepharitis left lower eyelid: Secondary | ICD-10-CM | POA: Diagnosis not present

## 2018-01-27 DIAGNOSIS — H353132 Nonexudative age-related macular degeneration, bilateral, intermediate dry stage: Secondary | ICD-10-CM | POA: Diagnosis not present

## 2018-01-27 DIAGNOSIS — M810 Age-related osteoporosis without current pathological fracture: Secondary | ICD-10-CM

## 2018-01-27 DIAGNOSIS — H01002 Unspecified blepharitis right lower eyelid: Secondary | ICD-10-CM | POA: Diagnosis not present

## 2018-01-27 DIAGNOSIS — H01004 Unspecified blepharitis left upper eyelid: Secondary | ICD-10-CM | POA: Diagnosis not present

## 2018-01-27 NOTE — Telephone Encounter (Addendum)
Deductible Amount Met  OOP MAX  $2365(413mt)  Annual exam 08/28/17 TF  Calcium  9.8            Date 02/19/18   Upcoming dental procedures NO  Prior Authorization needed NO  Pt estimated Cost  $222  Appt 03/11/18 at 9:30     Coverage Details:20% of one dose,20% admin fee

## 2018-01-28 DIAGNOSIS — Z79899 Other long term (current) drug therapy: Secondary | ICD-10-CM | POA: Diagnosis not present

## 2018-02-13 DIAGNOSIS — M545 Low back pain: Secondary | ICD-10-CM | POA: Diagnosis not present

## 2018-02-13 DIAGNOSIS — M1711 Unilateral primary osteoarthritis, right knee: Secondary | ICD-10-CM | POA: Diagnosis not present

## 2018-02-17 ENCOUNTER — Other Ambulatory Visit: Payer: Medicare Other

## 2018-02-18 ENCOUNTER — Other Ambulatory Visit: Payer: Medicare Other

## 2018-02-19 ENCOUNTER — Other Ambulatory Visit: Payer: Medicare Other

## 2018-02-19 DIAGNOSIS — M810 Age-related osteoporosis without current pathological fracture: Secondary | ICD-10-CM

## 2018-02-19 LAB — CALCIUM: CALCIUM: 9.8 mg/dL (ref 8.6–10.4)

## 2018-03-03 DIAGNOSIS — M5416 Radiculopathy, lumbar region: Secondary | ICD-10-CM | POA: Diagnosis not present

## 2018-03-11 ENCOUNTER — Ambulatory Visit: Payer: Medicare Other

## 2018-03-11 DIAGNOSIS — M5416 Radiculopathy, lumbar region: Secondary | ICD-10-CM | POA: Diagnosis not present

## 2018-03-18 ENCOUNTER — Ambulatory Visit (INDEPENDENT_AMBULATORY_CARE_PROVIDER_SITE_OTHER): Payer: Medicare Other | Admitting: Gynecology

## 2018-03-18 DIAGNOSIS — M810 Age-related osteoporosis without current pathological fracture: Secondary | ICD-10-CM | POA: Diagnosis not present

## 2018-03-18 MED ORDER — DENOSUMAB 60 MG/ML ~~LOC~~ SOSY
60.0000 mg | PREFILLED_SYRINGE | Freq: Once | SUBCUTANEOUS | Status: AC
  Administered 2018-03-18: 60 mg via SUBCUTANEOUS

## 2018-04-04 DIAGNOSIS — M5416 Radiculopathy, lumbar region: Secondary | ICD-10-CM | POA: Diagnosis not present

## 2018-04-04 DIAGNOSIS — J069 Acute upper respiratory infection, unspecified: Secondary | ICD-10-CM | POA: Diagnosis not present

## 2018-04-04 NOTE — Telephone Encounter (Signed)
PROLIA GIVEN 03/18/18 NEXT INJECTION 09/18/2018

## 2018-04-09 DIAGNOSIS — H43822 Vitreomacular adhesion, left eye: Secondary | ICD-10-CM | POA: Diagnosis not present

## 2018-04-09 DIAGNOSIS — H43812 Vitreous degeneration, left eye: Secondary | ICD-10-CM | POA: Diagnosis not present

## 2018-04-09 DIAGNOSIS — H353114 Nonexudative age-related macular degeneration, right eye, advanced atrophic with subfoveal involvement: Secondary | ICD-10-CM | POA: Diagnosis not present

## 2018-04-09 DIAGNOSIS — H353122 Nonexudative age-related macular degeneration, left eye, intermediate dry stage: Secondary | ICD-10-CM | POA: Diagnosis not present

## 2018-04-09 DIAGNOSIS — E039 Hypothyroidism, unspecified: Secondary | ICD-10-CM | POA: Diagnosis not present

## 2018-04-11 DIAGNOSIS — M5416 Radiculopathy, lumbar region: Secondary | ICD-10-CM | POA: Diagnosis not present

## 2018-04-15 ENCOUNTER — Encounter: Payer: Self-pay | Admitting: Gynecology

## 2018-05-02 DIAGNOSIS — M5416 Radiculopathy, lumbar region: Secondary | ICD-10-CM | POA: Diagnosis not present

## 2018-05-08 DIAGNOSIS — M5416 Radiculopathy, lumbar region: Secondary | ICD-10-CM | POA: Diagnosis not present

## 2018-06-09 DIAGNOSIS — H35341 Macular cyst, hole, or pseudohole, right eye: Secondary | ICD-10-CM | POA: Diagnosis not present

## 2018-06-09 DIAGNOSIS — H01004 Unspecified blepharitis left upper eyelid: Secondary | ICD-10-CM | POA: Diagnosis not present

## 2018-06-09 DIAGNOSIS — H01005 Unspecified blepharitis left lower eyelid: Secondary | ICD-10-CM | POA: Diagnosis not present

## 2018-06-09 DIAGNOSIS — Z961 Presence of intraocular lens: Secondary | ICD-10-CM | POA: Diagnosis not present

## 2018-06-09 DIAGNOSIS — H01002 Unspecified blepharitis right lower eyelid: Secondary | ICD-10-CM | POA: Diagnosis not present

## 2018-06-09 DIAGNOSIS — H01001 Unspecified blepharitis right upper eyelid: Secondary | ICD-10-CM | POA: Diagnosis not present

## 2018-06-09 DIAGNOSIS — H353132 Nonexudative age-related macular degeneration, bilateral, intermediate dry stage: Secondary | ICD-10-CM | POA: Diagnosis not present

## 2018-06-11 DIAGNOSIS — H903 Sensorineural hearing loss, bilateral: Secondary | ICD-10-CM | POA: Diagnosis not present

## 2018-07-09 DIAGNOSIS — E039 Hypothyroidism, unspecified: Secondary | ICD-10-CM | POA: Diagnosis not present

## 2018-07-09 DIAGNOSIS — I10 Essential (primary) hypertension: Secondary | ICD-10-CM | POA: Diagnosis not present

## 2018-09-02 ENCOUNTER — Other Ambulatory Visit: Payer: Self-pay

## 2018-09-04 ENCOUNTER — Ambulatory Visit (INDEPENDENT_AMBULATORY_CARE_PROVIDER_SITE_OTHER): Payer: Medicare Other | Admitting: Gynecology

## 2018-09-04 ENCOUNTER — Encounter: Payer: Self-pay | Admitting: Gynecology

## 2018-09-04 ENCOUNTER — Telehealth: Payer: Self-pay | Admitting: *Deleted

## 2018-09-04 ENCOUNTER — Other Ambulatory Visit: Payer: Self-pay

## 2018-09-04 VITALS — BP 116/74 | Ht 60.0 in | Wt 122.0 lb

## 2018-09-04 DIAGNOSIS — N952 Postmenopausal atrophic vaginitis: Secondary | ICD-10-CM

## 2018-09-04 DIAGNOSIS — Z01419 Encounter for gynecological examination (general) (routine) without abnormal findings: Secondary | ICD-10-CM

## 2018-09-04 DIAGNOSIS — M810 Age-related osteoporosis without current pathological fracture: Secondary | ICD-10-CM

## 2018-09-04 MED ORDER — NYSTATIN-TRIAMCINOLONE 100000-0.1 UNIT/GM-% EX OINT: 1.0000 "application " | TOPICAL_OINTMENT | Freq: Two times a day (BID) | CUTANEOUS | 0 refills | Status: AC

## 2018-09-04 NOTE — Progress Notes (Signed)
    LAKIYA COTTAM [birth year] [id number]        85 y.o.  G4P4 for breast and pelvic exam.  Without gynecologic complaints  Past medical history,surgical history, problem list, medications, allergies, family history and social history were all reviewed and documented as reviewed in the EPIC chart.  ROS:  Performed with pertinent positives and negatives included in the history, assessment and plan.   Additional significant findings : None   Exam: [name] assistant Vitals:   09/04/18 0946  BP: 116/74  Weight: 122 lb (55.3 kg)  Height: 5' (1.524 m)   Body mass index is 23.83 kg/m.  General appearance:  Normal affect, orientation and appearance. Skin: Grossly normal HEENT: Without gross lesions.  No cervical or supraclavicular adenopathy. Thyroid normal.  Lungs:  Clear without wheezing, rales or rhonchi Cardiac: RR, without RMG Abdominal:  Soft, nontender, without masses, guarding, rebound, organomegaly or hernia Breasts:  Examined lying and sitting without masses, retractions, discharge or axillary adenopathy. Pelvic:  Ext, BUS, Vagina: With atrophic changes.  Mild urethral prolapse asymptomatic to the patient.  Cuff well supported  Adnexa: Without masses or tenderness    Anus and perineum: Normal   Rectovaginal: Normal sphincter tone without palpated masses or tenderness.    Assessment/Plan:  85 y.o. G44P4 female for breast and pelvic exam  1. Postmenopausal.  No significant menopausal symptoms.  Status post TVH/BSO/anterior colporrhaphy and vaginal vault graft suspension.  Subsequently had robotic sacral colpopexy.  She is doing well with exam showing good support. 2. Osteoporosis.  DEXA 10/2017 T score -3 overall stable/improved from prior study.  Saw Dr Cruzita Lederer in the past.  Initiated Prolia 2018.  Is doing well with this.  Due for shot later this month. 3. Colonoscopy 2007.  Will follow-up with primary physician for colon screening recommendations. 4. Pap smear 2011.   No Pap smear done today.  No history of significant abnormal Pap smears.  We both agree to stop screening per current screening guidelines. 5. Mammography coming due in July and I reminded her to schedule this.  Breast exam normal today. 6. Health maintenance.  No routine lab work done as patient does this elsewhere.  Follow-up every 6 months for Prolia.  Follow-up in 1 year for annual exam   Anastasio Auerbach MD, 10:13 AM 09/04/2018

## 2018-09-04 NOTE — Patient Instructions (Signed)
Follow-up for your Prolia shot as arranged.  Follow-up in 1 year for annual exam

## 2018-09-04 NOTE — Telephone Encounter (Signed)
Deductible 198(167mt)primary secondary ded $305)$2929m)  OOP MAX primary amount met,secondary $2435($29671m  Annual exam 09/04/2018 TF  Calcium 9.8             Date 02/19/18  Upcoming dental procedures NO  Prior Authorization needed NO  Pt estimated Cost $231  Appt 09/23/18 @ 10:00     Coverage Details: 20% one dose,20% admin fee

## 2018-09-23 ENCOUNTER — Ambulatory Visit: Payer: Medicare Other

## 2018-09-23 ENCOUNTER — Ambulatory Visit (INDEPENDENT_AMBULATORY_CARE_PROVIDER_SITE_OTHER): Payer: Medicare Other | Admitting: Gynecology

## 2018-09-23 ENCOUNTER — Other Ambulatory Visit: Payer: Self-pay

## 2018-09-23 DIAGNOSIS — M810 Age-related osteoporosis without current pathological fracture: Secondary | ICD-10-CM | POA: Diagnosis not present

## 2018-09-23 MED ORDER — DENOSUMAB 60 MG/ML ~~LOC~~ SOSY
60.0000 mg | PREFILLED_SYRINGE | Freq: Once | SUBCUTANEOUS | Status: AC
  Administered 2018-09-23: 60 mg via SUBCUTANEOUS

## 2018-10-08 DIAGNOSIS — H43822 Vitreomacular adhesion, left eye: Secondary | ICD-10-CM | POA: Diagnosis not present

## 2018-10-08 DIAGNOSIS — H353134 Nonexudative age-related macular degeneration, bilateral, advanced atrophic with subfoveal involvement: Secondary | ICD-10-CM | POA: Diagnosis not present

## 2018-10-08 DIAGNOSIS — H43812 Vitreous degeneration, left eye: Secondary | ICD-10-CM | POA: Diagnosis not present

## 2018-10-22 ENCOUNTER — Encounter: Payer: Self-pay | Admitting: Gynecology

## 2018-10-31 DIAGNOSIS — Z1231 Encounter for screening mammogram for malignant neoplasm of breast: Secondary | ICD-10-CM | POA: Diagnosis not present

## 2018-11-04 ENCOUNTER — Encounter: Payer: Self-pay | Admitting: Gynecology

## 2018-11-04 DIAGNOSIS — L814 Other melanin hyperpigmentation: Secondary | ICD-10-CM | POA: Diagnosis not present

## 2018-11-04 DIAGNOSIS — L821 Other seborrheic keratosis: Secondary | ICD-10-CM | POA: Diagnosis not present

## 2018-11-04 DIAGNOSIS — D1801 Hemangioma of skin and subcutaneous tissue: Secondary | ICD-10-CM | POA: Diagnosis not present

## 2018-11-04 DIAGNOSIS — L905 Scar conditions and fibrosis of skin: Secondary | ICD-10-CM | POA: Diagnosis not present

## 2018-11-04 DIAGNOSIS — L910 Hypertrophic scar: Secondary | ICD-10-CM | POA: Diagnosis not present

## 2018-11-04 DIAGNOSIS — N6001 Solitary cyst of right breast: Secondary | ICD-10-CM | POA: Diagnosis not present

## 2018-11-04 DIAGNOSIS — R922 Inconclusive mammogram: Secondary | ICD-10-CM | POA: Diagnosis not present

## 2018-11-05 ENCOUNTER — Encounter: Payer: Self-pay | Admitting: Gynecology

## 2018-11-17 DIAGNOSIS — N6001 Solitary cyst of right breast: Secondary | ICD-10-CM | POA: Diagnosis not present

## 2018-11-18 ENCOUNTER — Encounter: Payer: Self-pay | Admitting: Gynecology

## 2018-12-10 DIAGNOSIS — H353132 Nonexudative age-related macular degeneration, bilateral, intermediate dry stage: Secondary | ICD-10-CM | POA: Diagnosis not present

## 2018-12-10 DIAGNOSIS — H35341 Macular cyst, hole, or pseudohole, right eye: Secondary | ICD-10-CM | POA: Diagnosis not present

## 2018-12-10 DIAGNOSIS — H01004 Unspecified blepharitis left upper eyelid: Secondary | ICD-10-CM | POA: Diagnosis not present

## 2018-12-10 DIAGNOSIS — H01001 Unspecified blepharitis right upper eyelid: Secondary | ICD-10-CM | POA: Diagnosis not present

## 2018-12-10 DIAGNOSIS — H02831 Dermatochalasis of right upper eyelid: Secondary | ICD-10-CM | POA: Diagnosis not present

## 2019-01-08 ENCOUNTER — Encounter: Payer: Self-pay | Admitting: Gynecology

## 2019-01-16 DIAGNOSIS — Z23 Encounter for immunization: Secondary | ICD-10-CM | POA: Diagnosis not present

## 2019-01-16 DIAGNOSIS — J301 Allergic rhinitis due to pollen: Secondary | ICD-10-CM | POA: Diagnosis not present

## 2019-01-16 DIAGNOSIS — Z0000 Encounter for general adult medical examination without abnormal findings: Secondary | ICD-10-CM | POA: Diagnosis not present

## 2019-01-16 DIAGNOSIS — Z79899 Other long term (current) drug therapy: Secondary | ICD-10-CM | POA: Diagnosis not present

## 2019-01-16 DIAGNOSIS — I10 Essential (primary) hypertension: Secondary | ICD-10-CM | POA: Diagnosis not present

## 2019-01-16 DIAGNOSIS — E039 Hypothyroidism, unspecified: Secondary | ICD-10-CM | POA: Diagnosis not present

## 2019-01-16 DIAGNOSIS — Z1389 Encounter for screening for other disorder: Secondary | ICD-10-CM | POA: Diagnosis not present

## 2019-01-16 NOTE — Telephone Encounter (Signed)
Breanna [name] GIVEN 09/23/18 NEXT INJECTION  03/26/2019

## 2019-01-29 ENCOUNTER — Telehealth: Payer: Self-pay | Admitting: *Deleted

## 2019-01-29 NOTE — Telephone Encounter (Signed)
Patient called and left message in triage voicemail asking for return call regarding question about medication. I called and left message for patient to call me back.

## 2019-01-30 MED ORDER — CLOBETASOL PROPIONATE 0.05 % EX CREA: 1.0000 "application " | TOPICAL_CREAM | Freq: Two times a day (BID) | CUTANEOUS | 0 refills | Status: DC | PRN

## 2019-01-30 NOTE — Telephone Encounter (Signed)
Dr. Phineas Real patient called back regarding the mycology ointment prescribed on 09/04/18. Reports the cream didn't help much with external irritation. She has used twice daily as directed, irritation is not daily. No itching, no discharge, no odor. Please advise

## 2019-01-30 NOTE — Telephone Encounter (Signed)
Let us try clobetasol 0.05% cream 30 g apply nightly as needed

## 2019-01-30 NOTE — Telephone Encounter (Signed)
Patient aware, Rx sent.  

## 2019-03-02 ENCOUNTER — Telehealth: Payer: Self-pay | Admitting: *Deleted

## 2019-03-02 DIAGNOSIS — M810 Age-related osteoporosis without current pathological fracture: Secondary | ICD-10-CM

## 2019-03-02 NOTE — Telephone Encounter (Signed)
Prolia insurance verification has been sent awaiting Summary of benefits  

## 2019-03-06 NOTE — Telephone Encounter (Addendum)
Deductible $198($165met)  OOP MAX [id number] [phone])  Annual exam 09/04/2018 TF  Calcium  9.3           Date 03/25/2019  Upcoming dental procedures NO  Prior Authorization needed NO  Pt estimated Cost $231   Waiting on COVID test results to proceed with Prolia pt states she will call me back with results.    Coverage Details:20% OME DOSE,20%ADMIN FEE

## 2019-03-25 ENCOUNTER — Other Ambulatory Visit: Payer: Medicare Other

## 2019-03-30 ENCOUNTER — Other Ambulatory Visit: Payer: Self-pay

## 2019-03-31 ENCOUNTER — Ambulatory Visit: Payer: Medicare Other | Attending: Internal Medicine

## 2019-03-31 ENCOUNTER — Other Ambulatory Visit: Payer: Self-pay

## 2019-03-31 ENCOUNTER — Other Ambulatory Visit: Payer: Medicare Other

## 2019-03-31 DIAGNOSIS — Z20822 Contact with and (suspected) exposure to covid-19: Secondary | ICD-10-CM

## 2019-03-31 DIAGNOSIS — M810 Age-related osteoporosis without current pathological fracture: Secondary | ICD-10-CM

## 2019-04-01 LAB — CALCIUM: Calcium: 9.3 mg/dL (ref 8.6–10.4)

## 2019-04-01 LAB — NOVEL CORONAVIRUS, NAA: SARS-CoV-2, NAA: NOT DETECTED

## 2019-04-02 ENCOUNTER — Ambulatory Visit (INDEPENDENT_AMBULATORY_CARE_PROVIDER_SITE_OTHER): Payer: Medicare Other | Admitting: *Deleted

## 2019-04-02 ENCOUNTER — Telehealth: Payer: Self-pay

## 2019-04-02 ENCOUNTER — Other Ambulatory Visit: Payer: Self-pay

## 2019-04-02 DIAGNOSIS — M810 Age-related osteoporosis without current pathological fracture: Secondary | ICD-10-CM

## 2019-04-02 MED ORDER — DENOSUMAB 60 MG/ML ~~LOC~~ SOSY: 60.0000 mg | PREFILLED_SYRINGE | Freq: Once | SUBCUTANEOUS | Status: DC

## 2019-04-02 MED ORDER — DENOSUMAB 60 MG/ML ~~LOC~~ SOSY
60.0000 mg | PREFILLED_SYRINGE | Freq: Once | SUBCUTANEOUS | Status: AC
  Administered 2019-04-02: 16:00:00 60 mg via SUBCUTANEOUS

## 2019-04-02 NOTE — Telephone Encounter (Signed)
Daughter given result and verbalized understanding

## 2019-04-02 NOTE — Progress Notes (Signed)
p 

## 2019-04-20 ENCOUNTER — Other Ambulatory Visit: Payer: Self-pay

## 2019-04-20 ENCOUNTER — Encounter (HOSPITAL_COMMUNITY): Payer: Self-pay | Admitting: Emergency Medicine

## 2019-04-20 DIAGNOSIS — E039 Hypothyroidism, unspecified: Secondary | ICD-10-CM | POA: Diagnosis not present

## 2019-04-20 DIAGNOSIS — Y929 Unspecified place or not applicable: Secondary | ICD-10-CM | POA: Insufficient documentation

## 2019-04-20 DIAGNOSIS — Y939 Activity, unspecified: Secondary | ICD-10-CM | POA: Insufficient documentation

## 2019-04-20 DIAGNOSIS — Z79899 Other long term (current) drug therapy: Secondary | ICD-10-CM | POA: Insufficient documentation

## 2019-04-20 DIAGNOSIS — S51811A Laceration without foreign body of right forearm, initial encounter: Secondary | ICD-10-CM | POA: Insufficient documentation

## 2019-04-20 DIAGNOSIS — S59911A Unspecified injury of right forearm, initial encounter: Secondary | ICD-10-CM | POA: Diagnosis present

## 2019-04-20 DIAGNOSIS — I10 Essential (primary) hypertension: Secondary | ICD-10-CM | POA: Insufficient documentation

## 2019-04-20 DIAGNOSIS — Y999 Unspecified external cause status: Secondary | ICD-10-CM | POA: Insufficient documentation

## 2019-04-20 DIAGNOSIS — W541XXA Struck by dog, initial encounter: Secondary | ICD-10-CM | POA: Diagnosis not present

## 2019-04-20 NOTE — ED Triage Notes (Signed)
Pt reports that she was sitting on her daughter's couch when the dogs jumped on her and one paw of the dog caused open skin tear on right forearm. Open wound covered with saline moist gauze and coban.

## 2019-04-21 ENCOUNTER — Emergency Department (HOSPITAL_COMMUNITY)
Admission: EM | Admit: 2019-04-21 | Discharge: 2019-04-21 | Disposition: A | Discharge status: Home / Self Care | Payer: Medicare Other | Attending: Emergency Medicine | Admitting: Emergency Medicine

## 2019-04-21 DIAGNOSIS — S51811A Laceration without foreign body of right forearm, initial encounter: Secondary | ICD-10-CM

## 2019-04-21 NOTE — Discharge Instructions (Addendum)
Steri-strip wound closure care instructions are provided with your discharge papers.   Nice to see you, Breanna [name]. Follow up with Dr. Felipa Eth for wound check or for any concerning symptoms. Return here as needed.

## 2019-04-21 NOTE — ED Provider Notes (Signed)
Breanna [name] Provider Note   CSN: [id number] Arrival date & time: 04/20/19  1840     History Chief Complaint  Patient presents with  . Arm Injury  . Laceration    Breanna [name] is a 86 y.o. female.  Patient to ED with right arm injury/laceration. She reports her dog was on her lap and jumped down suddenly causing a skin tear to dorsal right forearm. No other injury. Tetanus is UTD. Not anticoagulated.   The history is provided by the patient. No language interpreter was used.       Past Medical History:  Diagnosis Date  . Hypertension   . Hypothyroidism   . Osteoporosis, senile 10/2017   T score -3.0.  Overall stable/improved over prior study on Prolia  . Vertigo Nov 2013   Work up at [facility] for stroke-tests negative, no further problem    Patient Active Problem List   Diagnosis Date Noted  . Cervical spinal stenosis 02/18/2012  . Cerebrovascular disease 02/18/2012  . Vertigo 02/17/2012  . Hypertension 02/17/2012  . Hypothyroid 02/17/2012  . Osteoporosis 02/17/2012    Past Surgical History:  Procedure Laterality Date  . BACK SURGERY  2006  . CYSTOCELE REPAIR N/A 08/19/2012   Procedure: ANTERIOR REPAIR (CYSTOCELE);  Surgeon: Reece Packer, MD;  Location: [facility];  Service: Urology;  Laterality: N/A;  . CYSTOSCOPY N/A 08/19/2012   Procedure: CYSTOSCOPY;  Surgeon: Reece Packer, MD;  Location: [facility];  Service: Urology;  Laterality: N/A;  . EYE SURGERY    . SALPINGOOPHORECTOMY Bilateral 08/19/2012   Procedure: SALPINGO OOPHORECTOMY;  Surgeon: Marvene Staff, MD;  Location: [facility];  Service: Gynecology;  Laterality: Bilateral;  . TONSILLECTOMY    . TUBAL LIGATION    . VAGINAL HYSTERECTOMY N/A 08/19/2012   Procedure: HYSTERECTOMY VAGINAL;  Surgeon: Marvene Staff, MD;  Location: [facility];  Service: Gynecology;  Laterality: N/A;  . VAGINAL PROLAPSE REPAIR N/A 08/19/2012   Procedure: VAULT PROLAPSE WITH GRAFT;   Surgeon: Reece Packer, MD;  Location: [facility];  Service: Urology;  Laterality: N/A;     OB History    Gravida  4   Para  4   Term      Preterm      AB      Living  4     SAB      TAB      Ectopic      Multiple      Live Births              Family History  Problem Relation Age of Onset  . Stroke Father   . Cancer - Colon Mother     Social History   Tobacco Use  . Smoking status: Never Smoker  . Smokeless tobacco: Never Used  Substance Use Topics  . Alcohol use: Yes    Alcohol/week: 0.0 standard drinks    Comment: socially drinks wine  . Drug use: No    Home Medications Prior to Admission medications   Medication Sig Start Date End Date Taking? Authorizing Provider  acetaminophen (TYLENOL) 500 MG tablet Take 1,000 mg by mouth every 6 (six) hours as needed for mild pain.    [provider]  Calcium-Vitamin D (CALTRATE 600 PLUS-VIT D PO) Take 1 tablet by mouth 2 (two) times daily.    [provider]  Cholecalciferol 1000 UNITS tablet Take 1,000 Units by mouth daily.    [provider]  clobetasol cream (TEMOVATE) [id number]-123456789 % Apply 1 application topically 2 (two) times daily as needed. 01/30/19   Fontaine, Belinda Block, MD  conjugated estrogens (PREMARIN) vaginal cream Place 1 Applicatorful vaginally daily.    [provider]  denosumab (PROLIA) 60 MG/ML SOSY injection Inject into the skin. 01/05/16   [provider]  felodipine (PLENDIL) 10 MG 24 hr tablet Take 5 mg by mouth daily.     [provider]  levothyroxine (SYNTHROID, LEVOTHROID) 88 MCG tablet Take 88 mcg by mouth daily before breakfast.    [provider]  lisinopril (PRINIVIL,ZESTRIL) 10 MG tablet Take by mouth.    [provider]  Multiple Vitamins-Minerals ([place]) MISC Take 1 tablet by mouth daily.    [provider]  nystatin-triamcinolone ointment (MYCOLOG) Apply 1 application topically 2 (two) times  daily. 09/04/18   Fontaine, Belinda Block, MD  Polyethyl Glycol-Propyl Glycol (SYSTANE OP) Apply 2 drops to eye daily as needed (for dry eyes.).    [provider]  polyethylene glycol (MIRALAX / GLYCOLAX) packet Take 17 g by mouth daily.    [provider]    Allergies    Anesthetics, amide; Percocet [oxycodone-acetaminophen]; and Sulfa antibiotics  Review of Systems   Review of Systems  Musculoskeletal:       See HPI.  Skin: Positive for wound.    Physical Exam Updated Vital Signs BP (!) 167/80 (BP Location: Left Arm)   Pulse 83   Temp 98.2 F (36.8 C) (Oral)   Resp 15   SpO2 100%   Physical Exam Constitutional:      Appearance: She is well-developed.  Pulmonary:     Effort: Pulmonary effort is normal.  Musculoskeletal:     Cervical back: Normal range of motion.  Skin:    General: Skin is warm and dry.     Comments: Skin tear with irregular border measuring 6 cm. Depth is limited to dermis with a small portion invading subcutaneous tissue. No exposed muscle or tendon injury.   Neurological:     Mental Status: She is alert and oriented to person, place, and time.     ED Results / Procedures / Treatments   Labs (all labs ordered are listed, but only abnormal results are displayed) Labs Reviewed - No data to display  EKG None  Radiology No results found.  Procedures .Marland KitchenLaceration Repair  Date/Time: 04/27/2019 6:10 AM Performed by: Charlann Lange, PA-C Authorized by: Charlann Lange, PA-C   Consent:    Consent obtained:  Verbal   Consent given by:  Patient Anesthesia (see MAR for exact dosages):    Anesthesia method:  None Laceration details:    Location:  Shoulder/arm   Shoulder/arm location:  R lower arm   Length (cm):  6 Repair type:    Repair type:  Simple Pre-procedure details:    Preparation:  Patient was prepped and draped in usual sterile fashion Exploration:    Hemostasis achieved with:  Direct pressure   Contaminated: no     Treatment:    Area cleansed with:  Saline   Irrigation method:  Pressure wash Skin repair:    Repair method:  Steri-Strips   Number of Steri-Strips:  4 Approximation:    Approximation:  Close Post-procedure details:    Dressing:  Non-adherent dressing   (including critical care time)  Medications Ordered in ED Medications - No data to display  ED Course  I have reviewed the triage vital signs and the nursing notes.  Pertinent  labs & imaging results that were available during my care of the patient were reviewed by me and considered in my medical decision making (see chart for details).    MDM Rules/Calculators/A&P                      Patient to ED With skin tear wound repaired as per above note. Care instructions discussed.   Final Clinical Impression(s) / ED Diagnoses Final diagnoses:  None   1. Skin tear right arm.  Rx / DC Orders ED Discharge Orders    None       Dennie Bible 04/27/19 [id number]    Palumbo, April, MD 04/27/19 2308

## 2019-04-27 DIAGNOSIS — S61501A Unspecified open wound of right wrist, initial encounter: Secondary | ICD-10-CM | POA: Diagnosis not present

## 2019-05-04 DIAGNOSIS — M25561 Pain in right knee: Secondary | ICD-10-CM | POA: Diagnosis not present

## 2019-05-04 DIAGNOSIS — M1711 Unilateral primary osteoarthritis, right knee: Secondary | ICD-10-CM | POA: Diagnosis not present

## 2019-05-04 DIAGNOSIS — M1712 Unilateral primary osteoarthritis, left knee: Secondary | ICD-10-CM | POA: Diagnosis not present

## 2019-05-04 DIAGNOSIS — M25562 Pain in left knee: Secondary | ICD-10-CM | POA: Diagnosis not present

## 2019-05-12 DIAGNOSIS — M1711 Unilateral primary osteoarthritis, right knee: Secondary | ICD-10-CM | POA: Diagnosis not present

## 2019-05-12 DIAGNOSIS — M545 Low back pain: Secondary | ICD-10-CM | POA: Diagnosis not present

## 2019-05-12 DIAGNOSIS — M6281 Muscle weakness (generalized): Secondary | ICD-10-CM | POA: Diagnosis not present

## 2019-05-14 DIAGNOSIS — M6281 Muscle weakness (generalized): Secondary | ICD-10-CM | POA: Diagnosis not present

## 2019-05-14 DIAGNOSIS — M1711 Unilateral primary osteoarthritis, right knee: Secondary | ICD-10-CM | POA: Diagnosis not present

## 2019-05-14 DIAGNOSIS — M545 Low back pain: Secondary | ICD-10-CM | POA: Diagnosis not present

## 2019-05-18 DIAGNOSIS — M6281 Muscle weakness (generalized): Secondary | ICD-10-CM | POA: Diagnosis not present

## 2019-05-18 DIAGNOSIS — M1711 Unilateral primary osteoarthritis, right knee: Secondary | ICD-10-CM | POA: Diagnosis not present

## 2019-05-18 DIAGNOSIS — M545 Low back pain: Secondary | ICD-10-CM | POA: Diagnosis not present

## 2019-05-25 DIAGNOSIS — M1711 Unilateral primary osteoarthritis, right knee: Secondary | ICD-10-CM | POA: Diagnosis not present

## 2019-05-25 DIAGNOSIS — M545 Low back pain: Secondary | ICD-10-CM | POA: Diagnosis not present

## 2019-05-25 DIAGNOSIS — M6281 Muscle weakness (generalized): Secondary | ICD-10-CM | POA: Diagnosis not present

## 2019-05-28 DIAGNOSIS — M545 Low back pain: Secondary | ICD-10-CM | POA: Diagnosis not present

## 2019-05-28 DIAGNOSIS — M1711 Unilateral primary osteoarthritis, right knee: Secondary | ICD-10-CM | POA: Diagnosis not present

## 2019-05-28 DIAGNOSIS — M6281 Muscle weakness (generalized): Secondary | ICD-10-CM | POA: Diagnosis not present

## 2019-06-01 DIAGNOSIS — M6281 Muscle weakness (generalized): Secondary | ICD-10-CM | POA: Diagnosis not present

## 2019-06-01 DIAGNOSIS — M545 Low back pain: Secondary | ICD-10-CM | POA: Diagnosis not present

## 2019-06-01 DIAGNOSIS — M1711 Unilateral primary osteoarthritis, right knee: Secondary | ICD-10-CM | POA: Diagnosis not present

## 2019-06-02 DIAGNOSIS — H00021 Hordeolum internum right upper eyelid: Secondary | ICD-10-CM | POA: Diagnosis not present

## 2019-06-03 DIAGNOSIS — M545 Low back pain: Secondary | ICD-10-CM | POA: Diagnosis not present

## 2019-06-03 DIAGNOSIS — M1711 Unilateral primary osteoarthritis, right knee: Secondary | ICD-10-CM | POA: Diagnosis not present

## 2019-06-03 DIAGNOSIS — M6281 Muscle weakness (generalized): Secondary | ICD-10-CM | POA: Diagnosis not present

## 2019-06-08 DIAGNOSIS — M545 Low back pain: Secondary | ICD-10-CM | POA: Diagnosis not present

## 2019-06-08 DIAGNOSIS — M1711 Unilateral primary osteoarthritis, right knee: Secondary | ICD-10-CM | POA: Diagnosis not present

## 2019-06-08 DIAGNOSIS — M6281 Muscle weakness (generalized): Secondary | ICD-10-CM | POA: Diagnosis not present

## 2019-06-08 NOTE — Telephone Encounter (Signed)
PROLIA GIVEN 04/02/2019 NEXT INJECTION 10/01/2019

## 2019-06-12 DIAGNOSIS — M6281 Muscle weakness (generalized): Secondary | ICD-10-CM | POA: Diagnosis not present

## 2019-06-12 DIAGNOSIS — M545 Low back pain: Secondary | ICD-10-CM | POA: Diagnosis not present

## 2019-06-12 DIAGNOSIS — M1711 Unilateral primary osteoarthritis, right knee: Secondary | ICD-10-CM | POA: Diagnosis not present

## 2019-06-18 DIAGNOSIS — M25561 Pain in right knee: Secondary | ICD-10-CM | POA: Diagnosis not present

## 2019-06-18 DIAGNOSIS — M1711 Unilateral primary osteoarthritis, right knee: Secondary | ICD-10-CM | POA: Diagnosis not present

## 2019-06-18 DIAGNOSIS — M545 Low back pain: Secondary | ICD-10-CM | POA: Diagnosis not present

## 2019-06-18 DIAGNOSIS — M1712 Unilateral primary osteoarthritis, left knee: Secondary | ICD-10-CM | POA: Diagnosis not present

## 2019-06-18 DIAGNOSIS — M25562 Pain in left knee: Secondary | ICD-10-CM | POA: Diagnosis not present

## 2019-06-18 DIAGNOSIS — M6281 Muscle weakness (generalized): Secondary | ICD-10-CM | POA: Diagnosis not present

## 2019-06-22 DIAGNOSIS — M545 Low back pain: Secondary | ICD-10-CM | POA: Diagnosis not present

## 2019-06-22 DIAGNOSIS — M1711 Unilateral primary osteoarthritis, right knee: Secondary | ICD-10-CM | POA: Diagnosis not present

## 2019-06-22 DIAGNOSIS — M6281 Muscle weakness (generalized): Secondary | ICD-10-CM | POA: Diagnosis not present

## 2019-06-29 DIAGNOSIS — M545 Low back pain: Secondary | ICD-10-CM | POA: Diagnosis not present

## 2019-06-29 DIAGNOSIS — M6281 Muscle weakness (generalized): Secondary | ICD-10-CM | POA: Diagnosis not present

## 2019-06-29 DIAGNOSIS — M1711 Unilateral primary osteoarthritis, right knee: Secondary | ICD-10-CM | POA: Diagnosis not present

## 2019-06-30 ENCOUNTER — Encounter: Payer: Self-pay | Admitting: Gynecology

## 2019-07-02 DIAGNOSIS — M6281 Muscle weakness (generalized): Secondary | ICD-10-CM | POA: Diagnosis not present

## 2019-07-02 DIAGNOSIS — M545 Low back pain: Secondary | ICD-10-CM | POA: Diagnosis not present

## 2019-07-02 DIAGNOSIS — M1711 Unilateral primary osteoarthritis, right knee: Secondary | ICD-10-CM | POA: Diagnosis not present

## 2019-07-06 DIAGNOSIS — H353134 Nonexudative age-related macular degeneration, bilateral, advanced atrophic with subfoveal involvement: Secondary | ICD-10-CM | POA: Diagnosis not present

## 2019-07-06 DIAGNOSIS — H43812 Vitreous degeneration, left eye: Secondary | ICD-10-CM | POA: Diagnosis not present

## 2019-07-06 DIAGNOSIS — H43822 Vitreomacular adhesion, left eye: Secondary | ICD-10-CM | POA: Diagnosis not present

## 2019-07-07 DIAGNOSIS — M1711 Unilateral primary osteoarthritis, right knee: Secondary | ICD-10-CM | POA: Diagnosis not present

## 2019-07-07 DIAGNOSIS — M6281 Muscle weakness (generalized): Secondary | ICD-10-CM | POA: Diagnosis not present

## 2019-07-07 DIAGNOSIS — M545 Low back pain: Secondary | ICD-10-CM | POA: Diagnosis not present

## 2019-07-09 DIAGNOSIS — M1711 Unilateral primary osteoarthritis, right knee: Secondary | ICD-10-CM | POA: Diagnosis not present

## 2019-07-09 DIAGNOSIS — M6281 Muscle weakness (generalized): Secondary | ICD-10-CM | POA: Diagnosis not present

## 2019-07-09 DIAGNOSIS — M545 Low back pain: Secondary | ICD-10-CM | POA: Diagnosis not present

## 2019-07-16 DIAGNOSIS — M25561 Pain in right knee: Secondary | ICD-10-CM | POA: Diagnosis not present

## 2019-07-20 DIAGNOSIS — M6281 Muscle weakness (generalized): Secondary | ICD-10-CM | POA: Diagnosis not present

## 2019-07-20 DIAGNOSIS — M1711 Unilateral primary osteoarthritis, right knee: Secondary | ICD-10-CM | POA: Diagnosis not present

## 2019-07-20 DIAGNOSIS — M545 Low back pain: Secondary | ICD-10-CM | POA: Diagnosis not present

## 2019-07-23 DIAGNOSIS — M1711 Unilateral primary osteoarthritis, right knee: Secondary | ICD-10-CM | POA: Diagnosis not present

## 2019-07-23 DIAGNOSIS — M6281 Muscle weakness (generalized): Secondary | ICD-10-CM | POA: Diagnosis not present

## 2019-07-23 DIAGNOSIS — M545 Low back pain: Secondary | ICD-10-CM | POA: Diagnosis not present

## 2019-07-24 DIAGNOSIS — I10 Essential (primary) hypertension: Secondary | ICD-10-CM | POA: Diagnosis not present

## 2019-07-27 DIAGNOSIS — M6281 Muscle weakness (generalized): Secondary | ICD-10-CM | POA: Diagnosis not present

## 2019-07-27 DIAGNOSIS — M1711 Unilateral primary osteoarthritis, right knee: Secondary | ICD-10-CM | POA: Diagnosis not present

## 2019-07-27 DIAGNOSIS — M545 Low back pain: Secondary | ICD-10-CM | POA: Diagnosis not present

## 2019-07-29 DIAGNOSIS — M1711 Unilateral primary osteoarthritis, right knee: Secondary | ICD-10-CM | POA: Diagnosis not present

## 2019-07-29 DIAGNOSIS — M545 Low back pain: Secondary | ICD-10-CM | POA: Diagnosis not present

## 2019-07-29 DIAGNOSIS — M6281 Muscle weakness (generalized): Secondary | ICD-10-CM | POA: Diagnosis not present

## 2019-08-03 DIAGNOSIS — H903 Sensorineural hearing loss, bilateral: Secondary | ICD-10-CM | POA: Diagnosis not present

## 2019-08-03 DIAGNOSIS — Z822 Family history of deafness and hearing loss: Secondary | ICD-10-CM | POA: Diagnosis not present

## 2019-09-04 ENCOUNTER — Other Ambulatory Visit: Payer: Self-pay

## 2019-09-07 ENCOUNTER — Encounter: Payer: Self-pay | Admitting: Obstetrics and Gynecology

## 2019-09-07 ENCOUNTER — Encounter: Payer: Medicare Other | Admitting: Obstetrics and Gynecology

## 2019-09-07 ENCOUNTER — Other Ambulatory Visit: Payer: Self-pay

## 2019-09-07 ENCOUNTER — Ambulatory Visit (INDEPENDENT_AMBULATORY_CARE_PROVIDER_SITE_OTHER): Payer: Medicare Other | Admitting: Obstetrics and Gynecology

## 2019-09-07 VITALS — BP 120/66 | Ht 60.0 in | Wt 120.0 lb

## 2019-09-07 DIAGNOSIS — N369 Urethral disorder, unspecified: Secondary | ICD-10-CM | POA: Diagnosis not present

## 2019-09-07 DIAGNOSIS — Z01419 Encounter for gynecological examination (general) (routine) without abnormal findings: Secondary | ICD-10-CM

## 2019-09-07 DIAGNOSIS — N952 Postmenopausal atrophic vaginitis: Secondary | ICD-10-CM

## 2019-09-07 DIAGNOSIS — M810 Age-related osteoporosis without current pathological fracture: Secondary | ICD-10-CM | POA: Diagnosis not present

## 2019-09-07 DIAGNOSIS — Z780 Asymptomatic menopausal state: Secondary | ICD-10-CM

## 2019-09-07 NOTE — Progress Notes (Signed)
Breanna [name] 04-01-1931 [id number]  SUBJECTIVE:  86 y.o. G4P4 female here for an annual routine gynecologic exam. She has no gynecologic concerns.   Current Outpatient Medications  Medication Sig Dispense Refill  . acetaminophen (TYLENOL) 500 MG tablet Take 1,000 mg by mouth every 6 (six) hours as needed for mild pain.    . Calcium-Vitamin D (CALTRATE 600 PLUS-VIT D PO) Take 1 tablet by mouth 2 (two) times daily.    . Cholecalciferol 1000 UNITS tablet Take 1,000 Units by mouth daily.    . clobetasol cream (TEMOVATE) 3.82 % Apply 1 application topically 2 (two) times daily as needed. 30 g 0  . conjugated estrogens (PREMARIN) vaginal cream Place 1 Applicatorful vaginally daily.    Marland Kitchen denosumab (PROLIA) 60 MG/ML SOSY injection Inject into the skin.    . felodipine (PLENDIL) 10 MG 24 hr tablet Take 5 mg by mouth daily.     Marland Kitchen levothyroxine (SYNTHROID, LEVOTHROID) 88 MCG tablet Take 88 mcg by mouth daily before breakfast.    . lisinopril (PRINIVIL,ZESTRIL) 10 MG tablet Take by mouth.    . Multiple Vitamins-Minerals ([facility]) MISC Take 1 tablet by mouth daily.    Marland Kitchen nystatin-triamcinolone ointment (MYCOLOG) Apply 1 application topically 2 (two) times daily. 30 g 0  . Polyethyl Glycol-Propyl Glycol (SYSTANE OP) Apply 2 drops to eye daily as needed (for dry eyes.).    Marland Kitchen polyethylene glycol (MIRALAX / GLYCOLAX) packet Take 17 g by mouth daily.     Current Facility-Administered Medications  Medication Dose Route Frequency Provider Last Rate Last Admin  . denosumab (PROLIA) injection 60 mg  60 mg Subcutaneous Once Elayne Snare, MD       Allergies: Anesthetics, amide; Percocet [oxycodone-acetaminophen]; and Sulfa antibiotics  No LMP recorded. Patient has had a hysterectomy.  Past medical history,surgical history, problem list, medications, allergies, family history and social history were all reviewed and documented as reviewed in the EPIC chart.  ROS:  Feeling well. No dyspnea or  chest pain on exertion.  No abdominal pain, change in bowel habits, black or bloody stools.  No urinary tract symptoms. GYN ROS: no abnormal bleeding, pelvic pain or discharge, no breast pain or new or enlarging lumps on self exam. No neurological complaints.   OBJECTIVE:  BP 120/66   Ht 5' (1.524 m)   Wt 120 lb (54.4 kg)   BMI 23.44 kg/m  The patient appears well, alert, oriented x 3, in no distress. ENT normal.  Neck supple. No cervical or supraclavicular adenopathy or thyromegaly.  Lungs are clear, good air entry, no wheezes, rhonchi or rales. S1 and S2 normal, no murmurs, regular rate and rhythm.  Abdomen soft without tenderness, guarding, mass or organomegaly.  Neurological is normal, no focal findings.  BREAST EXAM: breasts appear normal, no suspicious masses, no skin or nipple changes or axillary nodes  PELVIC EXAM: VULVA: Dime size red circumferential mass smooth and dark red in color in the midline at the urethra, not friable, does not appear to be a caruncle could be urethral prolapse or some sort of urethral/periurethral mass, otherwise normal appearing vulva with no masses, tenderness or lesions, VAGINA: normal appearing vagina with normal color and discharge, no lesions, cuff well supported, CERVIX: surgically absent, UTERUS: surgically absent, vaginal cuff normal, ADNEXA: no masses, non tender  Chaperone: [name] present during the examination  ASSESSMENT:  86 y.o. G4P4 here for annual gynecologic exam  PLAN:   1. Postmenopausal. Prior TVH/BSO with anterior colporrhaphy and vaginal  vault graft suspension, subsequently she had a robotic sacrocolpopexy.  No problems with recurrent pelvic organ prolapse at this time. 2. Pap smear 2011.  No significant history of abnormal Pap smears.  Comfortable with not screening based on age and prior hysterectomy according to current guidelines. 3. Mammogram 11/2018.  Normal breast exam today. She is reminded to schedule an annual  mammogram this year when due. 4. Colonoscopy 2007.  Recommended that she follow up at the recommended interval, she checks with her primary in regards to when to follow-up for this.  5. DEXA 10/2017.  T score -3.0, BMD stable per report.  Continues on Prolia, with next injection planned for 10/01/2019.  Next DEXA recommended this year so she plans to schedule this at checkout today. 6. Urethral mass.  Could be just ureteral prolapse but given the appearance I think a biopsy is warranted although she will make arrangements to return the clinic in the next few weeks to have this done. 7. Health maintenance.  No labs today as she normally has these completed with her primary care provider.     Return annually or sooner, prn.  Joseph Pierini MD 09/07/19

## 2019-09-08 ENCOUNTER — Encounter: Payer: Medicare Other | Admitting: Obstetrics and Gynecology

## 2019-09-08 NOTE — Addendum Note (Signed)
Addended by: Nelva Nay on: 09/08/2019 12:16 PM   Modules accepted: Orders

## 2019-09-21 ENCOUNTER — Encounter: Payer: Self-pay | Admitting: Obstetrics and Gynecology

## 2019-09-21 ENCOUNTER — Other Ambulatory Visit: Payer: Self-pay

## 2019-09-21 ENCOUNTER — Ambulatory Visit (INDEPENDENT_AMBULATORY_CARE_PROVIDER_SITE_OTHER): Payer: Medicare Other | Admitting: Obstetrics and Gynecology

## 2019-09-21 VITALS — BP 122/78

## 2019-09-21 DIAGNOSIS — N368 Other specified disorders of urethra: Secondary | ICD-10-CM | POA: Diagnosis not present

## 2019-09-21 DIAGNOSIS — N369 Urethral disorder, unspecified: Secondary | ICD-10-CM

## 2019-09-21 NOTE — Progress Notes (Signed)
   Breanna [name] February 23, 1931 [id number]  SUBJECTIVE:  86 y.o. G4P4 female presents for follow-up evaluation of a red area urethral mass discovered at her routine examination 2 weeks ago.  She has not had any bleeding or vulvar/vaginal pain.  Current Outpatient Medications  Medication Sig Dispense Refill  . acetaminophen (TYLENOL) 500 MG tablet Take 1,000 mg by mouth every 6 (six) hours as needed for mild pain.    . Calcium-Vitamin D (CALTRATE 600 PLUS-VIT D PO) Take 1 tablet by mouth 2 (two) times daily.    . Cholecalciferol 1000 UNITS tablet Take 1,000 Units by mouth daily.    . clobetasol cream (TEMOVATE) 0.62 % Apply 1 application topically 2 (two) times daily as needed. 30 g 0  . conjugated estrogens (PREMARIN) vaginal cream Place 1 Applicatorful vaginally daily.    Marland Kitchen denosumab (PROLIA) 60 MG/ML SOSY injection Inject into the skin.    . felodipine (PLENDIL) 10 MG 24 hr tablet Take 5 mg by mouth daily.     Marland Kitchen levothyroxine (SYNTHROID, LEVOTHROID) 88 MCG tablet Take 88 mcg by mouth daily before breakfast.    . lisinopril (PRINIVIL,ZESTRIL) 10 MG tablet Take by mouth.    . Multiple Vitamins-Minerals (MH MACULAR HEALTH) MISC Take 1 tablet by mouth daily.    Marland Kitchen nystatin-triamcinolone ointment (MYCOLOG) Apply 1 application topically 2 (two) times daily. 30 g 0  . Polyethyl Glycol-Propyl Glycol (SYSTANE OP) Apply 2 drops to eye daily as needed (for dry eyes.).    Marland Kitchen polyethylene glycol (MIRALAX / GLYCOLAX) packet Take 17 g by mouth daily.     Current Facility-Administered Medications  Medication Dose Route Frequency Provider Last Rate Last Admin  . denosumab (PROLIA) injection 60 mg  60 mg Subcutaneous Once Breanna Snare, MD       Allergies: Anesthetics, amide; Percocet [oxycodone-acetaminophen]; and Sulfa antibiotics  No LMP recorded. Patient has had a hysterectomy.  Past medical history,surgical history, problem list, medications, allergies, family history and social history were all  reviewed and documented as reviewed in the EPIC chart.  ROS:  Feeling well. No dyspnea or chest pain on exertion.  No abdominal pain, change in bowel habits, black or bloody stools.  No urinary tract symptoms. GYN ROS: As described in HPI.   OBJECTIVE:  BP 122/78  The patient appears well, alert, oriented x 3, in no distress. PELVIC EXAM: VULVA: Dime size red circumferential mass smooth and dark red in color in the midline at the urethra, not friable, does not appear to be a caruncle but appears more consistent with urethral prolapse as it is symmetrical and it encompasses the urethral meatus, otherwise normal appearing vulva with no masses, tenderness or lesions.  Dilute acetic acid was applied to the periphery of the erythematous tissue and there are no acetowhite changes.  VAGINA: normal appearing vagina with normal color and discharge, no lesions, cuff well supported   Chaperone: Breanna [name] present during the examination  ASSESSMENT:  86 y.o. G4P4 with urethral prolapse  PLAN:  No biopsy is necessary today as closer inspection of the finding from the previous examination visually confirms that it is most consistent with urethral prolapse and there are no acetowhite changes to suggest that it would be consistent with any sort of vulvar dysplasia/cancer.  Routine annual exam is sufficient at this time unless she notices any urinary stream diversion, pain, vaginal bleeding. Any of those symptoms should prompt her to seek evaluation sooner.   Joseph Pierini MD 09/21/19

## 2019-09-23 ENCOUNTER — Telehealth: Payer: Self-pay | Admitting: *Deleted

## 2019-09-23 NOTE — Telephone Encounter (Signed)
Prolia insurance verification has been sent awaiting Summary of benefits  

## 2019-09-28 NOTE — Telephone Encounter (Addendum)
Deductible $203($218met)  OOP MAX N/A  Annual exam 09/07/2019 JK  Calcium  9.3           Date 03/31/2019  Upcoming dental procedures   Prior Authorization needed NO   Pt estimated Cost $0  APPT 10/08/2019      Coverage Details: 0% ONE DOSE, 0% ADMIN FEE

## 2019-10-08 ENCOUNTER — Ambulatory Visit (INDEPENDENT_AMBULATORY_CARE_PROVIDER_SITE_OTHER): Payer: Medicare Other | Admitting: *Deleted

## 2019-10-08 ENCOUNTER — Other Ambulatory Visit: Payer: Self-pay

## 2019-10-08 DIAGNOSIS — M810 Age-related osteoporosis without current pathological fracture: Secondary | ICD-10-CM

## 2019-10-08 MED ORDER — DENOSUMAB 60 MG/ML ~~LOC~~ SOSY
60.0000 mg | PREFILLED_SYRINGE | Freq: Once | SUBCUTANEOUS | Status: AC
  Administered 2019-10-08: 60 mg via SUBCUTANEOUS

## 2019-10-28 ENCOUNTER — Encounter: Payer: Self-pay | Admitting: Obstetrics and Gynecology

## 2019-10-28 ENCOUNTER — Ambulatory Visit (INDEPENDENT_AMBULATORY_CARE_PROVIDER_SITE_OTHER): Payer: Medicare Other | Admitting: Obstetrics and Gynecology

## 2019-10-28 ENCOUNTER — Other Ambulatory Visit: Payer: Self-pay

## 2019-10-28 VITALS — BP 118/76

## 2019-10-28 DIAGNOSIS — R350 Frequency of micturition: Secondary | ICD-10-CM

## 2019-10-28 MED ORDER — NITROFURANTOIN MONOHYD MACRO 100 MG PO CAPS
100.0000 mg | ORAL_CAPSULE | Freq: Two times a day (BID) | ORAL | 0 refills | Status: DC
Start: 2019-10-28 — End: 2019-11-24

## 2019-10-28 NOTE — Progress Notes (Signed)
   Breanna [name] 08-Oct-1930 [id number]  SUBJECTIVE:  86 y.o. G4P4 female presents for evaluation of urinary frequency.  Mostly at night, but woke up this morning generally not feeling very well but feels better at the moment.  No back pain or fever.  History notable for prior TVH/BSO with vaginal vault graft suspension and anterior colporrhaphy, subsequently she had a robotic sacrocolpopexy.  She has a urethral prolapse.  Allergies: Anesthetics, amide; Percocet [oxycodone-acetaminophen]; and Sulfa antibiotics  No LMP recorded. Patient has had a hysterectomy.  Past medical history,surgical history, problem list, medications, allergies, family history and social history were all reviewed and documented as reviewed in the EPIC chart.  ROS:  Feeling well. No dyspnea or chest pain on exertion.  No abdominal pain, change in bowel habits, black or bloody stools.  + urinary tract symptoms.    OBJECTIVE:  BP 118/76  The patient appears well, alert, oriented, in no distress. PELVIC EXAM: Deferred  Urinalysis: 10-20 WBCs, 0-2 RBC, 6-10 squamous epithelial cells, few bacteria, 2+ leukocyte esterase, negative nitrate   ASSESSMENT:  86 y.o. G4P4 here for possible urinary tract infection  PLAN:  Discussed that UA is possibly suggestive of UTI, given her age and possible early systemic symptoms, I would prefer to empirically treat with antibiotics.  Sulfa allergy sensitivity noted so we will use Macrobid 100 mg twice daily x7 days.  We will run the urine culture and give her further instructions from there.   Joseph Pierini MD 10/28/19

## 2019-10-30 LAB — URINALYSIS, COMPLETE W/RFL CULTURE
Bilirubin Urine: NEGATIVE
Glucose, UA: NEGATIVE
Hyaline Cast: NONE SEEN /LPF
Nitrites, Initial: NEGATIVE
Protein, ur: NEGATIVE
Specific Gravity, Urine: 1.02 (ref 1.001–1.03)
pH: 5.5 (ref 5.0–8.0)

## 2019-10-30 LAB — URINE CULTURE
MICRO NUMBER:: 10759810
SPECIMEN QUALITY:: ADEQUATE

## 2019-10-30 LAB — CULTURE INDICATED

## 2019-11-11 DIAGNOSIS — I10 Essential (primary) hypertension: Secondary | ICD-10-CM | POA: Diagnosis not present

## 2019-11-11 DIAGNOSIS — K5901 Slow transit constipation: Secondary | ICD-10-CM | POA: Diagnosis not present

## 2019-11-18 DIAGNOSIS — Z1231 Encounter for screening mammogram for malignant neoplasm of breast: Secondary | ICD-10-CM | POA: Diagnosis not present

## 2019-11-19 ENCOUNTER — Telehealth: Payer: Self-pay | Admitting: *Deleted

## 2019-11-19 MED ORDER — CLOBETASOL PROPIONATE 0.05 % EX CREA: 1.0000 "application " | TOPICAL_CREAM | Freq: Two times a day (BID) | CUTANEOUS | 0 refills | Status: AC | PRN

## 2019-11-19 NOTE — Telephone Encounter (Signed)
Dr.Kendall patient called requesting on clobetasol cream 0.05%. okay to send?

## 2019-11-19 NOTE — Telephone Encounter (Signed)
Patient called and left message in triage voicemail requesting a refill on cream. However she has a couple of different cream listed in her chart. I asked her to return my call to find out which cream.

## 2019-11-19 NOTE — Telephone Encounter (Signed)
yes

## 2019-11-19 NOTE — Telephone Encounter (Signed)
Rx sent 

## 2019-11-20 ENCOUNTER — Telehealth: Payer: Self-pay | Admitting: *Deleted

## 2019-11-20 ENCOUNTER — Encounter: Payer: Self-pay | Admitting: Gynecology

## 2019-11-20 NOTE — Telephone Encounter (Signed)
Patient called c/o frequent urination only during the night, reports she has been getting up around 2am to use the urinate at least 4-5 times. No pain, no fever, no lower back pain. But has noticed a urine odor, she asked if u/a could be left. I explained okay to leave a u/a, but urine will not be ran today because it was not an office visit and the urine the are "dropped off" are ran at the off site [place] lab. I explained she could schedule office visit, was seen and treated with Macrobid 100 mg x 7 days in 10/2019 here, however the culture showed "mixed flora". I explained this to patient. She did not wish to schedule a visit at this time, reports she will monitor symptoms and call on next week if symptoms have not subsided.

## 2019-11-24 ENCOUNTER — Encounter: Payer: Self-pay | Admitting: Obstetrics and Gynecology

## 2019-11-24 ENCOUNTER — Other Ambulatory Visit: Payer: Self-pay

## 2019-11-24 ENCOUNTER — Ambulatory Visit (INDEPENDENT_AMBULATORY_CARE_PROVIDER_SITE_OTHER): Payer: Medicare Other | Admitting: Obstetrics and Gynecology

## 2019-11-24 VITALS — BP 122/72

## 2019-11-24 DIAGNOSIS — R351 Nocturia: Secondary | ICD-10-CM

## 2019-11-24 NOTE — Progress Notes (Signed)
   Breanna [name] 04-04-1930 [id number]  SUBJECTIVE:  86 y.o. G4P4 female presents for evaluation of nocturia symptoms.  She had presented about a month ago for generalized urinary frequency night and day and was treated for presumed UTI stuff the UA with Macrobid which she took for a few days before we told her to discontinue because the culture returned showing only mixed flora.  She says she urinary frequency symptoms improved, but then in the past few days she started notice more urinary frequency at night again going hourly some evenings.  During the day she says she only may be goes 1-2 times and sometimes really has to think about emptying her bladder before being able to void.  If she has achieved regular bowel movements she feels her bladder empties more easily.  She does not have the hesitancy issue at night and voids larger volumes then.  In the past few nights the urinary frequency has decreased to every 2 hours or so.  She does admit to drinking a lot of water but is mindful of try not to drink as much during the evening hours.   Allergies: Anesthetics, amide; Percocet [oxycodone-acetaminophen]; and Sulfa antibiotics  No LMP recorded. Patient has had a hysterectomy.  Past medical history,surgical history, problem list, medications, allergies, family history and social history were all reviewed and documented as reviewed in the EPIC chart.  ROS: + urinary tract symptoms as described above GYN ROS: No vaginal bleeding, pain, or discharge   OBJECTIVE:  BP 122/72  The patient appears well, alert, oriented x 3, in no distress. PELVIC EXAM: Deferred Urinalysis: WBC 6-10, RBC none, squamous epithelial cells 6-10/hpf, few bacteria, 2+ leukocyte esterase, negative nitrite, dark yellow and cloudy  ASSESSMENT:  86 y.o. G4P4 here for nocturia  PLAN:  We discussed that she might have a low-grade UTI, but today's UA does not strongly suggest that, however.  I would recommend awaiting the  urine culture results to see if antibiotics are needed.  If she had a UTI she would also be experiencing increased frequency during the day most likely as well.  No appreciable pelvic floor prolapse was noted at her recent examination in June. I encouraged her to try to retrain her bladder by forcing timing voids every 1-2 hours during the daytime, stopping fluid intake after 7-8 PM (she typically goes to bed at midnight) and to concentrate her fluid intake consumption to the daytime hours.  If after trying this change for a few weeks she variance any improvement then we should have her consult with urology. She is in agreement with this plan.   Joseph Pierini MD 11/24/19

## 2019-11-24 NOTE — Patient Instructions (Signed)
Try to retrain the bladder by forcing timing urinary voids every 1-2 hours during the daytime, stopping fluid intake after 7-8 PM, and concentrate fluid intake consumption to the daytime hours.   If after trying this change for a few weeks you do not notice any improvement, then we should have you consult with urology.

## 2019-11-26 LAB — URINE CULTURE
MICRO NUMBER:: 10864759
SPECIMEN QUALITY:: ADEQUATE

## 2019-11-26 LAB — URINALYSIS, COMPLETE W/RFL CULTURE
Bilirubin Urine: NEGATIVE
Glucose, UA: NEGATIVE
Hgb urine dipstick: NEGATIVE
Hyaline Cast: NONE SEEN /LPF
Ketones, ur: NEGATIVE
Nitrites, Initial: NEGATIVE
Protein, ur: NEGATIVE
RBC / HPF: NONE SEEN /HPF (ref 0–2)
Specific Gravity, Urine: 1.015 (ref 1.001–1.03)
pH: 7 (ref 5.0–8.0)

## 2019-11-26 LAB — CULTURE INDICATED

## 2019-12-01 ENCOUNTER — Ambulatory Visit (INDEPENDENT_AMBULATORY_CARE_PROVIDER_SITE_OTHER): Payer: Medicare Other

## 2019-12-01 ENCOUNTER — Other Ambulatory Visit: Payer: Self-pay | Admitting: Obstetrics and Gynecology

## 2019-12-01 ENCOUNTER — Other Ambulatory Visit: Payer: Self-pay

## 2019-12-01 DIAGNOSIS — Z780 Asymptomatic menopausal state: Secondary | ICD-10-CM

## 2019-12-01 DIAGNOSIS — M810 Age-related osteoporosis without current pathological fracture: Secondary | ICD-10-CM | POA: Diagnosis not present

## 2019-12-02 ENCOUNTER — Encounter: Payer: Self-pay | Admitting: Obstetrics and Gynecology

## 2019-12-02 DIAGNOSIS — N6001 Solitary cyst of right breast: Secondary | ICD-10-CM | POA: Diagnosis not present

## 2019-12-16 ENCOUNTER — Telehealth: Payer: Self-pay | Admitting: *Deleted

## 2019-12-16 NOTE — Telephone Encounter (Signed)
-----   Message from Thamas Jaegers, [place] sent at 12/15/2019 11:57 AM EDT ----- Patient wanted to know why her spine was excluded from bone density? I told her it appears it has always been this way. Do you know why?

## 2019-12-24 ENCOUNTER — Other Ambulatory Visit: Payer: Self-pay | Admitting: Otolaryngology

## 2019-12-24 DIAGNOSIS — H918X9 Other specified hearing loss, unspecified ear: Secondary | ICD-10-CM

## 2019-12-24 DIAGNOSIS — H838X3 Other specified diseases of inner ear, bilateral: Secondary | ICD-10-CM | POA: Diagnosis not present

## 2019-12-24 DIAGNOSIS — H903 Sensorineural hearing loss, bilateral: Secondary | ICD-10-CM | POA: Diagnosis not present

## 2019-12-24 NOTE — Telephone Encounter (Signed)
Called pt no call back will close encounter

## 2020-01-06 DIAGNOSIS — Z23 Encounter for immunization: Secondary | ICD-10-CM | POA: Diagnosis not present

## 2020-01-08 DIAGNOSIS — D692 Other nonthrombocytopenic purpura: Secondary | ICD-10-CM | POA: Diagnosis not present

## 2020-01-08 DIAGNOSIS — L649 Androgenic alopecia, unspecified: Secondary | ICD-10-CM | POA: Diagnosis not present

## 2020-01-08 DIAGNOSIS — D1801 Hemangioma of skin and subcutaneous tissue: Secondary | ICD-10-CM | POA: Diagnosis not present

## 2020-01-08 DIAGNOSIS — L821 Other seborrheic keratosis: Secondary | ICD-10-CM | POA: Diagnosis not present

## 2020-01-15 ENCOUNTER — Other Ambulatory Visit: Payer: Self-pay

## 2020-01-15 ENCOUNTER — Ambulatory Visit
Admission: RE | Admit: 2020-01-15 | Discharge: 2020-01-15 | Disposition: A | Discharge status: Home / Self Care | Payer: Medicare Other | Source: Ambulatory Visit | Attending: Otolaryngology | Admitting: Otolaryngology

## 2020-01-15 DIAGNOSIS — H918X9 Other specified hearing loss, unspecified ear: Secondary | ICD-10-CM

## 2020-01-15 DIAGNOSIS — H903 Sensorineural hearing loss, bilateral: Secondary | ICD-10-CM | POA: Diagnosis not present

## 2020-01-15 MED ORDER — GADOBENATE DIMEGLUMINE 529 MG/ML IV SOLN
11.0000 mL | Freq: Once | INTRAVENOUS | Status: AC | PRN
  Administered 2020-01-15: 11 mL via INTRAVENOUS

## 2020-01-19 DIAGNOSIS — Z23 Encounter for immunization: Secondary | ICD-10-CM | POA: Diagnosis not present

## 2020-01-19 DIAGNOSIS — Z79899 Other long term (current) drug therapy: Secondary | ICD-10-CM | POA: Diagnosis not present

## 2020-01-19 DIAGNOSIS — Z0000 Encounter for general adult medical examination without abnormal findings: Secondary | ICD-10-CM | POA: Diagnosis not present

## 2020-01-19 DIAGNOSIS — G2581 Restless legs syndrome: Secondary | ICD-10-CM | POA: Diagnosis not present

## 2020-01-19 DIAGNOSIS — Z1389 Encounter for screening for other disorder: Secondary | ICD-10-CM | POA: Diagnosis not present

## 2020-01-19 DIAGNOSIS — I10 Essential (primary) hypertension: Secondary | ICD-10-CM | POA: Diagnosis not present

## 2020-01-19 DIAGNOSIS — E039 Hypothyroidism, unspecified: Secondary | ICD-10-CM | POA: Diagnosis not present

## 2020-02-04 DIAGNOSIS — M5441 Lumbago with sciatica, right side: Secondary | ICD-10-CM | POA: Diagnosis not present

## 2020-02-04 DIAGNOSIS — M545 Low back pain, unspecified: Secondary | ICD-10-CM | POA: Diagnosis not present

## 2020-02-08 DIAGNOSIS — H02831 Dermatochalasis of right upper eyelid: Secondary | ICD-10-CM | POA: Diagnosis not present

## 2020-02-08 DIAGNOSIS — H35341 Macular cyst, hole, or pseudohole, right eye: Secondary | ICD-10-CM | POA: Diagnosis not present

## 2020-02-08 DIAGNOSIS — H0100A Unspecified blepharitis right eye, upper and lower eyelids: Secondary | ICD-10-CM | POA: Diagnosis not present

## 2020-02-08 DIAGNOSIS — H353132 Nonexudative age-related macular degeneration, bilateral, intermediate dry stage: Secondary | ICD-10-CM | POA: Diagnosis not present

## 2020-02-08 DIAGNOSIS — H00021 Hordeolum internum right upper eyelid: Secondary | ICD-10-CM | POA: Diagnosis not present

## 2020-02-21 DIAGNOSIS — R402362 Coma scale, best motor response, obeys commands, at arrival to emergency department: Secondary | ICD-10-CM | POA: Diagnosis not present

## 2020-02-21 DIAGNOSIS — S0181XA Laceration without foreign body of other part of head, initial encounter: Secondary | ICD-10-CM | POA: Diagnosis not present

## 2020-02-21 DIAGNOSIS — R402252 Coma scale, best verbal response, oriented, at arrival to emergency department: Secondary | ICD-10-CM | POA: Diagnosis not present

## 2020-02-21 DIAGNOSIS — S0990XA Unspecified injury of head, initial encounter: Secondary | ICD-10-CM | POA: Diagnosis not present

## 2020-02-21 DIAGNOSIS — T1490XA Injury, unspecified, initial encounter: Secondary | ICD-10-CM | POA: Diagnosis not present

## 2020-02-21 DIAGNOSIS — R402142 Coma scale, eyes open, spontaneous, at arrival to emergency department: Secondary | ICD-10-CM | POA: Diagnosis not present

## 2020-02-21 DIAGNOSIS — W109XXA Fall (on) (from) unspecified stairs and steps, initial encounter: Secondary | ICD-10-CM | POA: Diagnosis not present

## 2020-02-21 DIAGNOSIS — S0101XA Laceration without foreign body of scalp, initial encounter: Secondary | ICD-10-CM | POA: Diagnosis not present

## 2020-02-27 DIAGNOSIS — S0181XD Laceration without foreign body of other part of head, subsequent encounter: Secondary | ICD-10-CM | POA: Diagnosis not present

## 2020-02-29 DIAGNOSIS — W19XXXS Unspecified fall, sequela: Secondary | ICD-10-CM | POA: Diagnosis not present

## 2020-02-29 DIAGNOSIS — S0181XS Laceration without foreign body of other part of head, sequela: Secondary | ICD-10-CM | POA: Diagnosis not present

## 2020-03-08 DIAGNOSIS — M542 Cervicalgia: Secondary | ICD-10-CM | POA: Diagnosis not present

## 2020-03-08 DIAGNOSIS — M9901 Segmental and somatic dysfunction of cervical region: Secondary | ICD-10-CM | POA: Diagnosis not present

## 2020-03-08 DIAGNOSIS — M9902 Segmental and somatic dysfunction of thoracic region: Secondary | ICD-10-CM | POA: Diagnosis not present

## 2020-03-08 DIAGNOSIS — M546 Pain in thoracic spine: Secondary | ICD-10-CM | POA: Diagnosis not present

## 2020-03-10 DIAGNOSIS — M9901 Segmental and somatic dysfunction of cervical region: Secondary | ICD-10-CM | POA: Diagnosis not present

## 2020-03-10 DIAGNOSIS — M9902 Segmental and somatic dysfunction of thoracic region: Secondary | ICD-10-CM | POA: Diagnosis not present

## 2020-03-10 DIAGNOSIS — M546 Pain in thoracic spine: Secondary | ICD-10-CM | POA: Diagnosis not present

## 2020-03-10 DIAGNOSIS — M542 Cervicalgia: Secondary | ICD-10-CM | POA: Diagnosis not present

## 2020-03-15 DIAGNOSIS — I10 Essential (primary) hypertension: Secondary | ICD-10-CM | POA: Diagnosis not present

## 2020-03-15 DIAGNOSIS — M9902 Segmental and somatic dysfunction of thoracic region: Secondary | ICD-10-CM | POA: Diagnosis not present

## 2020-03-15 DIAGNOSIS — M542 Cervicalgia: Secondary | ICD-10-CM | POA: Diagnosis not present

## 2020-03-15 DIAGNOSIS — M546 Pain in thoracic spine: Secondary | ICD-10-CM | POA: Diagnosis not present

## 2020-03-15 DIAGNOSIS — M9901 Segmental and somatic dysfunction of cervical region: Secondary | ICD-10-CM | POA: Diagnosis not present

## 2020-03-15 DIAGNOSIS — E039 Hypothyroidism, unspecified: Secondary | ICD-10-CM | POA: Diagnosis not present

## 2020-03-15 DIAGNOSIS — M810 Age-related osteoporosis without current pathological fracture: Secondary | ICD-10-CM | POA: Diagnosis not present

## 2020-03-17 DIAGNOSIS — M9901 Segmental and somatic dysfunction of cervical region: Secondary | ICD-10-CM | POA: Diagnosis not present

## 2020-03-17 DIAGNOSIS — M542 Cervicalgia: Secondary | ICD-10-CM | POA: Diagnosis not present

## 2020-03-17 DIAGNOSIS — M9902 Segmental and somatic dysfunction of thoracic region: Secondary | ICD-10-CM | POA: Diagnosis not present

## 2020-03-17 DIAGNOSIS — M546 Pain in thoracic spine: Secondary | ICD-10-CM | POA: Diagnosis not present

## 2020-03-21 DIAGNOSIS — M542 Cervicalgia: Secondary | ICD-10-CM | POA: Diagnosis not present

## 2020-03-21 DIAGNOSIS — M546 Pain in thoracic spine: Secondary | ICD-10-CM | POA: Diagnosis not present

## 2020-03-21 DIAGNOSIS — M9901 Segmental and somatic dysfunction of cervical region: Secondary | ICD-10-CM | POA: Diagnosis not present

## 2020-03-21 DIAGNOSIS — M9902 Segmental and somatic dysfunction of thoracic region: Secondary | ICD-10-CM | POA: Diagnosis not present

## 2020-03-24 DIAGNOSIS — M542 Cervicalgia: Secondary | ICD-10-CM | POA: Diagnosis not present

## 2020-03-24 DIAGNOSIS — M9902 Segmental and somatic dysfunction of thoracic region: Secondary | ICD-10-CM | POA: Diagnosis not present

## 2020-03-24 DIAGNOSIS — M9901 Segmental and somatic dysfunction of cervical region: Secondary | ICD-10-CM | POA: Diagnosis not present

## 2020-03-24 DIAGNOSIS — M546 Pain in thoracic spine: Secondary | ICD-10-CM | POA: Diagnosis not present

## 2020-04-02 DIAGNOSIS — M87180 Osteonecrosis due to drugs, jaw: Secondary | ICD-10-CM

## 2020-04-02 HISTORY — DX: Osteonecrosis due to drugs, jaw: M87.180

## 2020-04-11 ENCOUNTER — Telehealth: Payer: Self-pay | Admitting: *Deleted

## 2020-04-11 DIAGNOSIS — M810 Age-related osteoporosis without current pathological fracture: Secondary | ICD-10-CM

## 2020-04-11 NOTE — Telephone Encounter (Signed)
Prolia insurance verification has been sent awaiting Summary of benefits  

## 2020-04-20 NOTE — Telephone Encounter (Addendum)
Deductible N/A  OOP MAX N/A  Annual exam 09/07/2019 jk  Calcium  9.7           Date 04/21/2020  Upcoming dental procedures   Prior Authorization needed NO  Pt estimated Cost $0    appt 04/26/20   Coverage Details: $0 one dose, $0 admin

## 2020-04-20 NOTE — Telephone Encounter (Signed)
PROLIA GIVEN 10/08/2019 NEXT INJECTION 04/11/2019

## 2020-04-21 ENCOUNTER — Other Ambulatory Visit (INDEPENDENT_AMBULATORY_CARE_PROVIDER_SITE_OTHER): Payer: Medicare Other

## 2020-04-21 ENCOUNTER — Other Ambulatory Visit: Payer: Self-pay

## 2020-04-21 DIAGNOSIS — M810 Age-related osteoporosis without current pathological fracture: Secondary | ICD-10-CM | POA: Diagnosis not present

## 2020-04-22 LAB — CALCIUM: Calcium: 9.7 mg/dL (ref 8.6–10.4)

## 2020-04-26 ENCOUNTER — Other Ambulatory Visit: Payer: Self-pay

## 2020-04-26 ENCOUNTER — Ambulatory Visit (INDEPENDENT_AMBULATORY_CARE_PROVIDER_SITE_OTHER): Payer: Medicare Other | Admitting: *Deleted

## 2020-04-26 DIAGNOSIS — M810 Age-related osteoporosis without current pathological fracture: Secondary | ICD-10-CM | POA: Diagnosis not present

## 2020-04-26 DIAGNOSIS — H353134 Nonexudative age-related macular degeneration, bilateral, advanced atrophic with subfoveal involvement: Secondary | ICD-10-CM | POA: Diagnosis not present

## 2020-04-26 DIAGNOSIS — M542 Cervicalgia: Secondary | ICD-10-CM | POA: Diagnosis not present

## 2020-04-26 DIAGNOSIS — M546 Pain in thoracic spine: Secondary | ICD-10-CM | POA: Diagnosis not present

## 2020-04-26 DIAGNOSIS — H43812 Vitreous degeneration, left eye: Secondary | ICD-10-CM | POA: Diagnosis not present

## 2020-04-26 DIAGNOSIS — M9902 Segmental and somatic dysfunction of thoracic region: Secondary | ICD-10-CM | POA: Diagnosis not present

## 2020-04-26 DIAGNOSIS — H43822 Vitreomacular adhesion, left eye: Secondary | ICD-10-CM | POA: Diagnosis not present

## 2020-04-26 DIAGNOSIS — M9901 Segmental and somatic dysfunction of cervical region: Secondary | ICD-10-CM | POA: Diagnosis not present

## 2020-04-26 DIAGNOSIS — H35371 Puckering of macula, right eye: Secondary | ICD-10-CM | POA: Diagnosis not present

## 2020-04-26 MED ORDER — DENOSUMAB 60 MG/ML ~~LOC~~ SOSY
60.0000 mg | PREFILLED_SYRINGE | Freq: Once | SUBCUTANEOUS | Status: AC
  Administered 2020-04-26: 60 mg via SUBCUTANEOUS

## 2020-05-10 DIAGNOSIS — M546 Pain in thoracic spine: Secondary | ICD-10-CM | POA: Diagnosis not present

## 2020-05-10 DIAGNOSIS — M9902 Segmental and somatic dysfunction of thoracic region: Secondary | ICD-10-CM | POA: Diagnosis not present

## 2020-05-10 DIAGNOSIS — M9901 Segmental and somatic dysfunction of cervical region: Secondary | ICD-10-CM | POA: Diagnosis not present

## 2020-05-10 DIAGNOSIS — M542 Cervicalgia: Secondary | ICD-10-CM | POA: Diagnosis not present

## 2020-05-17 ENCOUNTER — Other Ambulatory Visit: Payer: Self-pay | Admitting: Internal Medicine

## 2020-05-17 DIAGNOSIS — M7989 Other specified soft tissue disorders: Secondary | ICD-10-CM

## 2020-05-17 DIAGNOSIS — M9902 Segmental and somatic dysfunction of thoracic region: Secondary | ICD-10-CM | POA: Diagnosis not present

## 2020-05-17 DIAGNOSIS — M546 Pain in thoracic spine: Secondary | ICD-10-CM | POA: Diagnosis not present

## 2020-05-17 DIAGNOSIS — M9901 Segmental and somatic dysfunction of cervical region: Secondary | ICD-10-CM | POA: Diagnosis not present

## 2020-05-17 DIAGNOSIS — M79605 Pain in left leg: Secondary | ICD-10-CM | POA: Diagnosis not present

## 2020-05-17 DIAGNOSIS — M542 Cervicalgia: Secondary | ICD-10-CM | POA: Diagnosis not present

## 2020-05-18 ENCOUNTER — Other Ambulatory Visit: Payer: Medicare Other

## 2020-05-19 DIAGNOSIS — M7989 Other specified soft tissue disorders: Secondary | ICD-10-CM | POA: Diagnosis not present

## 2020-05-26 DIAGNOSIS — M542 Cervicalgia: Secondary | ICD-10-CM | POA: Diagnosis not present

## 2020-05-26 DIAGNOSIS — L03116 Cellulitis of left lower limb: Secondary | ICD-10-CM | POA: Diagnosis not present

## 2020-05-26 DIAGNOSIS — M9902 Segmental and somatic dysfunction of thoracic region: Secondary | ICD-10-CM | POA: Diagnosis not present

## 2020-05-26 DIAGNOSIS — M9901 Segmental and somatic dysfunction of cervical region: Secondary | ICD-10-CM | POA: Diagnosis not present

## 2020-05-26 DIAGNOSIS — K05219 Aggressive periodontitis, localized, unspecified severity: Secondary | ICD-10-CM | POA: Diagnosis not present

## 2020-05-26 DIAGNOSIS — M546 Pain in thoracic spine: Secondary | ICD-10-CM | POA: Diagnosis not present

## 2020-05-30 DIAGNOSIS — K05219 Aggressive periodontitis, localized, unspecified severity: Secondary | ICD-10-CM | POA: Diagnosis not present

## 2020-05-30 DIAGNOSIS — L03116 Cellulitis of left lower limb: Secondary | ICD-10-CM | POA: Diagnosis not present

## 2020-05-31 DIAGNOSIS — M25561 Pain in right knee: Secondary | ICD-10-CM | POA: Diagnosis not present

## 2020-05-31 DIAGNOSIS — M1712 Unilateral primary osteoarthritis, left knee: Secondary | ICD-10-CM | POA: Diagnosis not present

## 2020-05-31 DIAGNOSIS — M25562 Pain in left knee: Secondary | ICD-10-CM | POA: Diagnosis not present

## 2020-05-31 DIAGNOSIS — M1711 Unilateral primary osteoarthritis, right knee: Secondary | ICD-10-CM | POA: Diagnosis not present

## 2020-06-08 DIAGNOSIS — M6281 Muscle weakness (generalized): Secondary | ICD-10-CM | POA: Diagnosis not present

## 2020-06-08 DIAGNOSIS — M1711 Unilateral primary osteoarthritis, right knee: Secondary | ICD-10-CM | POA: Diagnosis not present

## 2020-06-08 DIAGNOSIS — M1712 Unilateral primary osteoarthritis, left knee: Secondary | ICD-10-CM | POA: Diagnosis not present

## 2020-06-09 DIAGNOSIS — M9901 Segmental and somatic dysfunction of cervical region: Secondary | ICD-10-CM | POA: Diagnosis not present

## 2020-06-09 DIAGNOSIS — M542 Cervicalgia: Secondary | ICD-10-CM | POA: Diagnosis not present

## 2020-06-09 DIAGNOSIS — M9902 Segmental and somatic dysfunction of thoracic region: Secondary | ICD-10-CM | POA: Diagnosis not present

## 2020-06-09 DIAGNOSIS — M546 Pain in thoracic spine: Secondary | ICD-10-CM | POA: Diagnosis not present

## 2020-06-17 DIAGNOSIS — M1711 Unilateral primary osteoarthritis, right knee: Secondary | ICD-10-CM | POA: Diagnosis not present

## 2020-06-17 DIAGNOSIS — M1712 Unilateral primary osteoarthritis, left knee: Secondary | ICD-10-CM | POA: Diagnosis not present

## 2020-06-17 DIAGNOSIS — M6281 Muscle weakness (generalized): Secondary | ICD-10-CM | POA: Diagnosis not present

## 2020-06-20 DIAGNOSIS — M1712 Unilateral primary osteoarthritis, left knee: Secondary | ICD-10-CM | POA: Diagnosis not present

## 2020-06-20 DIAGNOSIS — M6281 Muscle weakness (generalized): Secondary | ICD-10-CM | POA: Diagnosis not present

## 2020-06-20 DIAGNOSIS — M1711 Unilateral primary osteoarthritis, right knee: Secondary | ICD-10-CM | POA: Diagnosis not present

## 2020-06-24 DIAGNOSIS — M6281 Muscle weakness (generalized): Secondary | ICD-10-CM | POA: Diagnosis not present

## 2020-06-24 DIAGNOSIS — M1712 Unilateral primary osteoarthritis, left knee: Secondary | ICD-10-CM | POA: Diagnosis not present

## 2020-06-24 DIAGNOSIS — M1711 Unilateral primary osteoarthritis, right knee: Secondary | ICD-10-CM | POA: Diagnosis not present

## 2020-06-25 DIAGNOSIS — E039 Hypothyroidism, unspecified: Secondary | ICD-10-CM | POA: Diagnosis not present

## 2020-06-25 DIAGNOSIS — I10 Essential (primary) hypertension: Secondary | ICD-10-CM | POA: Diagnosis not present

## 2020-06-25 DIAGNOSIS — M810 Age-related osteoporosis without current pathological fracture: Secondary | ICD-10-CM | POA: Diagnosis not present

## 2020-06-27 DIAGNOSIS — M6281 Muscle weakness (generalized): Secondary | ICD-10-CM | POA: Diagnosis not present

## 2020-06-27 DIAGNOSIS — M1711 Unilateral primary osteoarthritis, right knee: Secondary | ICD-10-CM | POA: Diagnosis not present

## 2020-06-27 DIAGNOSIS — M1712 Unilateral primary osteoarthritis, left knee: Secondary | ICD-10-CM | POA: Diagnosis not present

## 2020-06-29 DIAGNOSIS — M1712 Unilateral primary osteoarthritis, left knee: Secondary | ICD-10-CM | POA: Diagnosis not present

## 2020-06-29 DIAGNOSIS — M6281 Muscle weakness (generalized): Secondary | ICD-10-CM | POA: Diagnosis not present

## 2020-06-29 DIAGNOSIS — M1711 Unilateral primary osteoarthritis, right knee: Secondary | ICD-10-CM | POA: Diagnosis not present

## 2020-06-30 DIAGNOSIS — H838X3 Other specified diseases of inner ear, bilateral: Secondary | ICD-10-CM | POA: Diagnosis not present

## 2020-06-30 DIAGNOSIS — H903 Sensorineural hearing loss, bilateral: Secondary | ICD-10-CM | POA: Diagnosis not present

## 2020-06-30 DIAGNOSIS — H6123 Impacted cerumen, bilateral: Secondary | ICD-10-CM | POA: Diagnosis not present

## 2020-07-04 DIAGNOSIS — M1711 Unilateral primary osteoarthritis, right knee: Secondary | ICD-10-CM | POA: Diagnosis not present

## 2020-07-04 DIAGNOSIS — M1712 Unilateral primary osteoarthritis, left knee: Secondary | ICD-10-CM | POA: Diagnosis not present

## 2020-07-04 DIAGNOSIS — M6281 Muscle weakness (generalized): Secondary | ICD-10-CM | POA: Diagnosis not present

## 2020-07-06 DIAGNOSIS — M1711 Unilateral primary osteoarthritis, right knee: Secondary | ICD-10-CM | POA: Diagnosis not present

## 2020-07-06 DIAGNOSIS — M1712 Unilateral primary osteoarthritis, left knee: Secondary | ICD-10-CM | POA: Diagnosis not present

## 2020-07-06 DIAGNOSIS — M6281 Muscle weakness (generalized): Secondary | ICD-10-CM | POA: Diagnosis not present

## 2020-07-07 DIAGNOSIS — K136 Irritative hyperplasia of oral mucosa: Secondary | ICD-10-CM | POA: Diagnosis not present

## 2020-07-14 DIAGNOSIS — M546 Pain in thoracic spine: Secondary | ICD-10-CM | POA: Diagnosis not present

## 2020-07-14 DIAGNOSIS — M542 Cervicalgia: Secondary | ICD-10-CM | POA: Diagnosis not present

## 2020-07-14 DIAGNOSIS — M9902 Segmental and somatic dysfunction of thoracic region: Secondary | ICD-10-CM | POA: Diagnosis not present

## 2020-07-14 DIAGNOSIS — M9901 Segmental and somatic dysfunction of cervical region: Secondary | ICD-10-CM | POA: Diagnosis not present

## 2020-07-19 DIAGNOSIS — I10 Essential (primary) hypertension: Secondary | ICD-10-CM | POA: Diagnosis not present

## 2020-07-19 DIAGNOSIS — M1711 Unilateral primary osteoarthritis, right knee: Secondary | ICD-10-CM | POA: Diagnosis not present

## 2020-07-19 DIAGNOSIS — M1712 Unilateral primary osteoarthritis, left knee: Secondary | ICD-10-CM | POA: Diagnosis not present

## 2020-07-19 DIAGNOSIS — M6281 Muscle weakness (generalized): Secondary | ICD-10-CM | POA: Diagnosis not present

## 2020-07-19 DIAGNOSIS — I872 Venous insufficiency (chronic) (peripheral): Secondary | ICD-10-CM | POA: Diagnosis not present

## 2020-07-22 DIAGNOSIS — M6281 Muscle weakness (generalized): Secondary | ICD-10-CM | POA: Diagnosis not present

## 2020-07-22 DIAGNOSIS — M1712 Unilateral primary osteoarthritis, left knee: Secondary | ICD-10-CM | POA: Diagnosis not present

## 2020-07-22 DIAGNOSIS — M1711 Unilateral primary osteoarthritis, right knee: Secondary | ICD-10-CM | POA: Diagnosis not present

## 2020-07-25 DIAGNOSIS — M1712 Unilateral primary osteoarthritis, left knee: Secondary | ICD-10-CM | POA: Diagnosis not present

## 2020-07-25 DIAGNOSIS — M6281 Muscle weakness (generalized): Secondary | ICD-10-CM | POA: Diagnosis not present

## 2020-07-25 DIAGNOSIS — M1711 Unilateral primary osteoarthritis, right knee: Secondary | ICD-10-CM | POA: Diagnosis not present

## 2020-07-28 DIAGNOSIS — M546 Pain in thoracic spine: Secondary | ICD-10-CM | POA: Diagnosis not present

## 2020-07-28 DIAGNOSIS — M9901 Segmental and somatic dysfunction of cervical region: Secondary | ICD-10-CM | POA: Diagnosis not present

## 2020-07-28 DIAGNOSIS — M542 Cervicalgia: Secondary | ICD-10-CM | POA: Diagnosis not present

## 2020-07-28 DIAGNOSIS — M9902 Segmental and somatic dysfunction of thoracic region: Secondary | ICD-10-CM | POA: Diagnosis not present

## 2020-08-02 DIAGNOSIS — Z23 Encounter for immunization: Secondary | ICD-10-CM | POA: Diagnosis not present

## 2020-08-09 DIAGNOSIS — M9902 Segmental and somatic dysfunction of thoracic region: Secondary | ICD-10-CM | POA: Diagnosis not present

## 2020-08-09 DIAGNOSIS — M9901 Segmental and somatic dysfunction of cervical region: Secondary | ICD-10-CM | POA: Diagnosis not present

## 2020-08-09 DIAGNOSIS — M546 Pain in thoracic spine: Secondary | ICD-10-CM | POA: Diagnosis not present

## 2020-08-09 DIAGNOSIS — M542 Cervicalgia: Secondary | ICD-10-CM | POA: Diagnosis not present

## 2020-08-18 DIAGNOSIS — M1712 Unilateral primary osteoarthritis, left knee: Secondary | ICD-10-CM | POA: Diagnosis not present

## 2020-08-18 DIAGNOSIS — M6281 Muscle weakness (generalized): Secondary | ICD-10-CM | POA: Diagnosis not present

## 2020-08-18 DIAGNOSIS — M1711 Unilateral primary osteoarthritis, right knee: Secondary | ICD-10-CM | POA: Diagnosis not present

## 2020-08-19 DIAGNOSIS — M542 Cervicalgia: Secondary | ICD-10-CM | POA: Diagnosis not present

## 2020-08-19 DIAGNOSIS — M9902 Segmental and somatic dysfunction of thoracic region: Secondary | ICD-10-CM | POA: Diagnosis not present

## 2020-08-19 DIAGNOSIS — M9901 Segmental and somatic dysfunction of cervical region: Secondary | ICD-10-CM | POA: Diagnosis not present

## 2020-08-19 DIAGNOSIS — M546 Pain in thoracic spine: Secondary | ICD-10-CM | POA: Diagnosis not present

## 2020-08-22 DIAGNOSIS — I10 Essential (primary) hypertension: Secondary | ICD-10-CM | POA: Diagnosis not present

## 2020-08-22 DIAGNOSIS — M810 Age-related osteoporosis without current pathological fracture: Secondary | ICD-10-CM | POA: Diagnosis not present

## 2020-08-22 DIAGNOSIS — E039 Hypothyroidism, unspecified: Secondary | ICD-10-CM | POA: Diagnosis not present

## 2020-08-24 DIAGNOSIS — M1712 Unilateral primary osteoarthritis, left knee: Secondary | ICD-10-CM | POA: Diagnosis not present

## 2020-08-24 DIAGNOSIS — M6281 Muscle weakness (generalized): Secondary | ICD-10-CM | POA: Diagnosis not present

## 2020-08-24 DIAGNOSIS — M1711 Unilateral primary osteoarthritis, right knee: Secondary | ICD-10-CM | POA: Diagnosis not present

## 2020-08-31 DIAGNOSIS — M1712 Unilateral primary osteoarthritis, left knee: Secondary | ICD-10-CM | POA: Diagnosis not present

## 2020-08-31 DIAGNOSIS — M1711 Unilateral primary osteoarthritis, right knee: Secondary | ICD-10-CM | POA: Diagnosis not present

## 2020-08-31 DIAGNOSIS — M6281 Muscle weakness (generalized): Secondary | ICD-10-CM | POA: Diagnosis not present

## 2020-09-05 DIAGNOSIS — E039 Hypothyroidism, unspecified: Secondary | ICD-10-CM | POA: Diagnosis not present

## 2020-09-05 DIAGNOSIS — I10 Essential (primary) hypertension: Secondary | ICD-10-CM | POA: Diagnosis not present

## 2020-09-05 DIAGNOSIS — M810 Age-related osteoporosis without current pathological fracture: Secondary | ICD-10-CM | POA: Diagnosis not present

## 2020-09-07 DIAGNOSIS — M6281 Muscle weakness (generalized): Secondary | ICD-10-CM | POA: Diagnosis not present

## 2020-09-07 DIAGNOSIS — M1712 Unilateral primary osteoarthritis, left knee: Secondary | ICD-10-CM | POA: Diagnosis not present

## 2020-09-07 DIAGNOSIS — M1711 Unilateral primary osteoarthritis, right knee: Secondary | ICD-10-CM | POA: Diagnosis not present

## 2020-09-14 DIAGNOSIS — M1711 Unilateral primary osteoarthritis, right knee: Secondary | ICD-10-CM | POA: Diagnosis not present

## 2020-09-14 DIAGNOSIS — M1712 Unilateral primary osteoarthritis, left knee: Secondary | ICD-10-CM | POA: Diagnosis not present

## 2020-09-14 DIAGNOSIS — M6281 Muscle weakness (generalized): Secondary | ICD-10-CM | POA: Diagnosis not present

## 2020-09-15 DIAGNOSIS — I10 Essential (primary) hypertension: Secondary | ICD-10-CM | POA: Diagnosis not present

## 2020-09-15 DIAGNOSIS — I872 Venous insufficiency (chronic) (peripheral): Secondary | ICD-10-CM | POA: Diagnosis not present

## 2020-09-27 DIAGNOSIS — M1711 Unilateral primary osteoarthritis, right knee: Secondary | ICD-10-CM | POA: Diagnosis not present

## 2020-09-27 DIAGNOSIS — M6281 Muscle weakness (generalized): Secondary | ICD-10-CM | POA: Diagnosis not present

## 2020-09-27 DIAGNOSIS — M1712 Unilateral primary osteoarthritis, left knee: Secondary | ICD-10-CM | POA: Diagnosis not present

## 2020-10-25 DIAGNOSIS — H43812 Vitreous degeneration, left eye: Secondary | ICD-10-CM | POA: Diagnosis not present

## 2020-10-25 DIAGNOSIS — H353134 Nonexudative age-related macular degeneration, bilateral, advanced atrophic with subfoveal involvement: Secondary | ICD-10-CM | POA: Diagnosis not present

## 2020-10-25 DIAGNOSIS — H35341 Macular cyst, hole, or pseudohole, right eye: Secondary | ICD-10-CM | POA: Diagnosis not present

## 2020-10-25 DIAGNOSIS — H35371 Puckering of macula, right eye: Secondary | ICD-10-CM | POA: Diagnosis not present

## 2020-10-31 DIAGNOSIS — M6281 Muscle weakness (generalized): Secondary | ICD-10-CM | POA: Diagnosis not present

## 2020-11-02 DIAGNOSIS — M6281 Muscle weakness (generalized): Secondary | ICD-10-CM | POA: Diagnosis not present

## 2020-11-07 DIAGNOSIS — M6281 Muscle weakness (generalized): Secondary | ICD-10-CM | POA: Diagnosis not present

## 2020-11-09 ENCOUNTER — Telehealth: Payer: Self-pay | Admitting: *Deleted

## 2020-11-09 DIAGNOSIS — M6281 Muscle weakness (generalized): Secondary | ICD-10-CM | POA: Diagnosis not present

## 2020-11-09 NOTE — Telephone Encounter (Signed)
Prolia given 04/26/2020 Next injection 10/25/2020

## 2020-11-09 NOTE — Telephone Encounter (Addendum)
Deductible $233  OOP MAX  n/a  Annual exam  11/16/2020  Calcium   9.7          Date 04/21/2020  Upcoming dental procedures NO  Prior Authorization needed NO  Pt estimated Cost $0   Appt 11/16/2020   Coverage Details:$0 of one dose, $0 admin fee

## 2020-11-14 NOTE — Progress Notes (Signed)
GYNECOLOGY  VISIT   HPI: 87 y.o.   Widowed  Caucasian  female   G21P4 with No LMP recorded. Patient has had a hysterectomy.   here for breast and pelvic exam. Patient needs Prolia also today.  She is followed for osteoporosis.   Last BMD 12/01/19: T score of right femoral neck -3.2. T score of left femoral neck -2.6. Overall left hip improved and overall right hip stable. Overall left forearm slightly lower BMD. Spine excluded due to degenerative change.   States she had a bone growth in her jaw and had this removed through her oral health specialists.  She had a pyogenic granuloma biopsied also. Ultimately she had a tooth pulled 6 - 7 weeks ago and is planning a potential implant. She is having jaw pain and wants to reach out to her dental team.   GYNECOLOGIC HISTORY: No LMP recorded. Patient has had a hysterectomy. Contraception:  Tubal/Hyst Menopausal hormone therapy:  None Last mammogram: 8-18-21Rt.Br.mass which has increased in size;Lt.Br.Neg./Rt.Br.US reveals benign cyst/Neg/BiRads2. 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  Overall stable/improved over prior study on Prolia   Vertigo Nov 2013   Work up at [facility] for stroke-tests negative, no further problem    Past Surgical History:  Procedure Laterality Date   BACK SURGERY  2006   CYSTOCELE REPAIR N/A 08/19/2012   Procedure: ANTERIOR REPAIR  (CYSTOCELE);  Surgeon: Reece Packer, MD;  Location: [facility];  Service: Urology;  Laterality: N/A;   CYSTOSCOPY N/A 08/19/2012   Procedure: CYSTOSCOPY;  Surgeon: Reece Packer, MD;  Location: [facility];  Service: Urology;  Laterality: N/A;   EYE SURGERY     ROBOTIC ASSISTED LAPAROSCOPIC SACROCOLPOPEXY  2016   [facility] - Dr. Lurena Nida   SALPINGOOPHORECTOMY Bilateral 08/19/2012   Procedure: SALPINGO OOPHORECTOMY;  Surgeon: Marvene Staff, MD;  Location: [facility];  Service: Gynecology;  Laterality: Bilateral;   TONSILLECTOMY     TUBAL LIGATION     VAGINAL HYSTERECTOMY N/A 08/19/2012   Procedure: HYSTERECTOMY VAGINAL;  Surgeon: Marvene Staff, MD;  Location: [facility];  Service: Gynecology;  Laterality: N/A;   VAGINAL PROLAPSE REPAIR N/A 08/19/2012   Procedure: VAULT PROLAPSE WITH GRAFT;  Surgeon: Reece Packer, MD;  Location: [facility];  Service: Urology;  Laterality: N/A;    Current Outpatient Medications  Medication Sig Dispense Refill   acetaminophen (TYLENOL) 500 MG tablet Take 1,000 mg by mouth every 6 (six) hours as needed for mild pain.     Calcium-Vitamin D (CALTRATE 600 PLUS-VIT D PO) Take 1 tablet by  mouth 2 (two) times daily.     chlorhexidine (PERIDEX) 0.12 % solution SMARTSIG:By Mouth     Cholecalciferol 1000 UNITS tablet Take 1,000 Units by mouth daily.     clobetasol cream (TEMOVATE) [id number]-123456789 % Apply 1 application topically 2 (two) times daily as needed. 30 g 0   denosumab (PROLIA) 60 MG/ML SOSY injection Inject into the skin.     felodipine (PLENDIL) 10 MG 24 hr tablet Take 5 mg by mouth daily.      finasteride (PROSCAR) 5 MG tablet Take 2.5 mg by mouth daily.     levothyroxine (SYNTHROID, LEVOTHROID) 88 MCG tablet Take 88 mcg by mouth daily before breakfast.     lisinopril (PRINIVIL,ZESTRIL) 10 MG tablet Take by mouth.     Multiple Vitamins-Minerals (MH MACULAR HEALTH) MISC Take 1 tablet by mouth daily.     nystatin-triamcinolone ointment (MYCOLOG) Apply 1 application  topically 2 (two) times daily. 30 g 0   Polyethyl Glycol-Propyl Glycol (SYSTANE OP) Apply 2 drops to eye daily as needed (for dry eyes.).     polyethylene glycol (MIRALAX / GLYCOLAX) packet Take 17 g by mouth daily.     Current Facility-Administered Medications  Medication Dose Route Frequency Provider Last Rate Last Admin   denosumab (PROLIA) injection 60 mg  60 mg Subcutaneous Once Elayne Snare, MD         ALLERGIES: Anesthetics, amide; Percocet [oxycodone-acetaminophen]; and Sulfa antibiotics  Family History  Problem Relation Age of Onset   Stroke Father    Cancer - Colon Mother     Social History   Socioeconomic History   Marital status: Widowed    Spouse name: Not on file   Number of children: Not on file   Years of education: Not on file   Highest education level: Not on file  Occupational History   Not on file  Tobacco Use   Smoking status: Never   Smokeless tobacco: Never  Vaping Use   Vaping Use: Never used  Substance and Sexual Activity   Alcohol use: Yes    Alcohol/week: 3.0 standard drinks    Types: 3 Glasses of wine per week    Comment: socially drinks wine   Drug use: No   Sexual activity: Not Currently    Comment: 1st intercourse 55 yo-1 partner  Other Topics Concern   Not on file  Social History Narrative   Not on file   Social Determinants of Health   Financial Resource Strain: Not on file  Food Insecurity: Not on file  Transportation Needs: Not on file  Physical Activity: Not on file  Stress: Not on file  Social Connections: Not on file  Intimate Partner Violence: Not on file    Review of Systems  All other systems reviewed and are negative.  PHYSICAL EXAMINATION:    BP 124/68   Pulse 74   Ht 4' 11.5" (1.511 m)   Wt 120 lb (54.4 kg)   SpO2 99%   BMI 23.83 kg/m     General appearance: alert, cooperative and appears stated age Lungs: clear to auscultation bilaterally Breasts: right - normal appearance, 2 cm smooth and mobile mass at  2:00, no tenderness,  Nipple inverted (old change), No nipple discharge or bleeding, No axillary adenopathy. Left breast - normal appearance, no mass, no tenderness, Nipple inverted (old change), No nipple discharge or bleeding, No axillary adenopathy. Heart: regular rate and rhythm Abdomen: soft, non-tender, no masses, no organomegaly No abnormal inguinal nodes palpated  Pelvic: External genitalia:  urethral prolapse.               Urethra:  normal appearing urethra with no masses, tenderness or lesions              Bartholins and Skenes: normal                 Vagina: normal appearing vagina with normal color and discharge, no lesions.  Good support.  No visible mesh erosion.               Cervix: absent                Bimanual Exam:  Uterus: absent.              Adnexa: no mass, fullness, tenderness              Rectal exam: yes.  Confirms.              Anus:  normal sphincter tone, no lesions  Chaperone was present for exam:  Estill Bamberg, CMA.  ASSESSMENT  Status post vaginal hysterectomy/BSO with vault suspension and anterior colporrhaphy. Dr. Garwin Brothers and Dr. Matilde Sprang. Status post robotic sacrocolpopexy and cystoscopy, Dr. Lurena Nida.  Osteoporosis.  On Prolia.  Dental pain.  Screening breast exam. Right breast mass.  Known cyst.  Nipple inversion.  Pelvic exam with abnormal finding present. Urethral prolapse. Rectal exam.  PLAN  Bone density testing reviewed. I recommend against Prolia today.  Refer to Dr. Dwyane Dee who has seen patient in the past for osteoporosis consultation.  Patient is going to contact her dental team regarding her dental pain.  I have reviewed her dental biopsy in Epic, 4/7/222. Continue calcium and vitamin D.  Weight bearing exercise recommended.  She has an appointment for a screening mammogram which is ok as her right breast cyst is known and documented.  Start vaginal estradiol cream to the introitus and urethra twice weekly.  I did discuss potential  effect on breast cancer.  FU in one year and prn.  An After Visit Summary was printed and given to the patient.  72 minutes minutes total time was spent for this patient encounter, including preparation, face-to-face counseling with the patient, coordination of care, and documentation of the encounter.

## 2020-11-15 DIAGNOSIS — E039 Hypothyroidism, unspecified: Secondary | ICD-10-CM | POA: Diagnosis not present

## 2020-11-15 DIAGNOSIS — I10 Essential (primary) hypertension: Secondary | ICD-10-CM | POA: Diagnosis not present

## 2020-11-15 DIAGNOSIS — M810 Age-related osteoporosis without current pathological fracture: Secondary | ICD-10-CM | POA: Diagnosis not present

## 2020-11-16 ENCOUNTER — Ambulatory Visit (INDEPENDENT_AMBULATORY_CARE_PROVIDER_SITE_OTHER): Payer: Medicare Other | Admitting: Obstetrics and Gynecology

## 2020-11-16 ENCOUNTER — Encounter: Payer: Self-pay | Admitting: Obstetrics and Gynecology

## 2020-11-16 ENCOUNTER — Other Ambulatory Visit: Payer: Self-pay

## 2020-11-16 VITALS — BP 124/68 | HR 74 | Ht 59.5 in | Wt 120.0 lb

## 2020-11-16 DIAGNOSIS — N6310 Unspecified lump in the right breast, unspecified quadrant: Secondary | ICD-10-CM | POA: Diagnosis not present

## 2020-11-16 DIAGNOSIS — Z1239 Encounter for other screening for malignant neoplasm of breast: Secondary | ICD-10-CM | POA: Diagnosis not present

## 2020-11-16 DIAGNOSIS — N368 Other specified disorders of urethra: Secondary | ICD-10-CM | POA: Diagnosis not present

## 2020-11-16 DIAGNOSIS — Z008 Encounter for other general examination: Secondary | ICD-10-CM

## 2020-11-16 DIAGNOSIS — Z01411 Encounter for gynecological examination (general) (routine) with abnormal findings: Secondary | ICD-10-CM

## 2020-11-16 DIAGNOSIS — M810 Age-related osteoporosis without current pathological fracture: Secondary | ICD-10-CM

## 2020-11-16 MED ORDER — ESTRADIOL 0.1 MG/GM VA CREA: TOPICAL_CREAM | VAGINAL | 0 refills | Status: DC

## 2020-11-16 NOTE — Patient Instructions (Signed)

## 2020-11-17 DIAGNOSIS — M6281 Muscle weakness (generalized): Secondary | ICD-10-CM | POA: Diagnosis not present

## 2020-11-18 ENCOUNTER — Telehealth: Payer: Self-pay | Admitting: *Deleted

## 2020-11-18 NOTE — Telephone Encounter (Signed)
Patient called stating she has not heard anything about the endo referral, Dr. Quincy Simmonds just placed referral on 11/16/20 at [place]. Left message for patient to call if any questions.

## 2020-11-21 ENCOUNTER — Encounter: Payer: Self-pay | Admitting: Obstetrics and Gynecology

## 2020-11-21 ENCOUNTER — Telehealth: Payer: Self-pay | Admitting: Obstetrics and Gynecology

## 2020-11-21 DIAGNOSIS — M6281 Muscle weakness (generalized): Secondary | ICD-10-CM | POA: Diagnosis not present

## 2020-11-21 NOTE — Telephone Encounter (Signed)
Phone call from Dr. 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   Dr. Geralynn Ochs and I agree that the patient should discontinue Prolia.  Please discontinue this medication on her medication list.   I have indicated that I have referred the patient to an endocrinologist for further care of her osteoporosis.

## 2020-11-22 DIAGNOSIS — Z1231 Encounter for screening mammogram for malignant neoplasm of breast: Secondary | ICD-10-CM | POA: Diagnosis not present

## 2020-11-22 NOTE — Telephone Encounter (Signed)
Patient informed with below note, patient aware referral has been placed by Dr.Silva last week. Number given to patient.

## 2020-11-23 DIAGNOSIS — M6281 Muscle weakness (generalized): Secondary | ICD-10-CM | POA: Diagnosis not present

## 2020-11-28 DIAGNOSIS — M6281 Muscle weakness (generalized): Secondary | ICD-10-CM | POA: Diagnosis not present

## 2020-11-30 DIAGNOSIS — N6001 Solitary cyst of right breast: Secondary | ICD-10-CM | POA: Diagnosis not present

## 2020-11-30 DIAGNOSIS — R928 Other abnormal and inconclusive findings on diagnostic imaging of breast: Secondary | ICD-10-CM | POA: Diagnosis not present

## 2020-12-01 DIAGNOSIS — M6281 Muscle weakness (generalized): Secondary | ICD-10-CM | POA: Diagnosis not present

## 2020-12-05 DIAGNOSIS — M6281 Muscle weakness (generalized): Secondary | ICD-10-CM | POA: Diagnosis not present

## 2020-12-08 DIAGNOSIS — M6281 Muscle weakness (generalized): Secondary | ICD-10-CM | POA: Diagnosis not present

## 2020-12-12 DIAGNOSIS — N6001 Solitary cyst of right breast: Secondary | ICD-10-CM | POA: Diagnosis not present

## 2020-12-12 DIAGNOSIS — H353132 Nonexudative age-related macular degeneration, bilateral, intermediate dry stage: Secondary | ICD-10-CM | POA: Diagnosis not present

## 2020-12-12 DIAGNOSIS — D3131 Benign neoplasm of right choroid: Secondary | ICD-10-CM | POA: Diagnosis not present

## 2020-12-12 DIAGNOSIS — H35341 Macular cyst, hole, or pseudohole, right eye: Secondary | ICD-10-CM | POA: Diagnosis not present

## 2020-12-12 DIAGNOSIS — H0102A Squamous blepharitis right eye, upper and lower eyelids: Secondary | ICD-10-CM | POA: Diagnosis not present

## 2020-12-13 DIAGNOSIS — M6281 Muscle weakness (generalized): Secondary | ICD-10-CM | POA: Diagnosis not present

## 2020-12-16 DIAGNOSIS — M6281 Muscle weakness (generalized): Secondary | ICD-10-CM | POA: Diagnosis not present

## 2020-12-21 DIAGNOSIS — M6281 Muscle weakness (generalized): Secondary | ICD-10-CM | POA: Diagnosis not present

## 2020-12-23 DIAGNOSIS — M6281 Muscle weakness (generalized): Secondary | ICD-10-CM | POA: Diagnosis not present

## 2020-12-24 DIAGNOSIS — Z23 Encounter for immunization: Secondary | ICD-10-CM | POA: Diagnosis not present

## 2020-12-27 DIAGNOSIS — M6281 Muscle weakness (generalized): Secondary | ICD-10-CM | POA: Diagnosis not present

## 2021-01-19 ENCOUNTER — Telehealth: Payer: Self-pay | Admitting: *Deleted

## 2021-01-19 NOTE — Telephone Encounter (Signed)
Patient called stating she never heard back from the [place] office about referral. I saw the referral was approved by Dr.Kumar,but patient was not called. I called and patient is now scheduled on 03/22/21 @ 10:00am with Dr.Kumar. patient aware and thankful.

## 2021-02-03 DIAGNOSIS — H903 Sensorineural hearing loss, bilateral: Secondary | ICD-10-CM | POA: Diagnosis not present

## 2021-02-03 DIAGNOSIS — H9122 Sudden idiopathic hearing loss, left ear: Secondary | ICD-10-CM | POA: Diagnosis not present

## 2021-02-10 DIAGNOSIS — M545 Low back pain, unspecified: Secondary | ICD-10-CM | POA: Diagnosis not present

## 2021-02-11 DIAGNOSIS — M545 Low back pain, unspecified: Secondary | ICD-10-CM | POA: Diagnosis not present

## 2021-02-15 NOTE — Telephone Encounter (Signed)
[place] GIVEN 11/16/2020 NEXT INJECTION 05/20/2021

## 2021-02-17 DIAGNOSIS — M545 Low back pain, unspecified: Secondary | ICD-10-CM | POA: Diagnosis not present

## 2021-02-17 DIAGNOSIS — M25552 Pain in left hip: Secondary | ICD-10-CM | POA: Diagnosis not present

## 2021-02-20 DIAGNOSIS — S300XXD Contusion of lower back and pelvis, subsequent encounter: Secondary | ICD-10-CM | POA: Diagnosis not present

## 2021-02-20 DIAGNOSIS — S39012D Strain of muscle, fascia and tendon of lower back, subsequent encounter: Secondary | ICD-10-CM | POA: Diagnosis not present

## 2021-02-20 DIAGNOSIS — M5416 Radiculopathy, lumbar region: Secondary | ICD-10-CM | POA: Diagnosis not present

## 2021-02-22 DIAGNOSIS — S39012D Strain of muscle, fascia and tendon of lower back, subsequent encounter: Secondary | ICD-10-CM | POA: Diagnosis not present

## 2021-02-22 DIAGNOSIS — S300XXD Contusion of lower back and pelvis, subsequent encounter: Secondary | ICD-10-CM | POA: Diagnosis not present

## 2021-02-22 DIAGNOSIS — M5416 Radiculopathy, lumbar region: Secondary | ICD-10-CM | POA: Diagnosis not present

## 2021-02-27 DIAGNOSIS — M5416 Radiculopathy, lumbar region: Secondary | ICD-10-CM | POA: Diagnosis not present

## 2021-02-27 DIAGNOSIS — S300XXD Contusion of lower back and pelvis, subsequent encounter: Secondary | ICD-10-CM | POA: Diagnosis not present

## 2021-02-27 DIAGNOSIS — S39012D Strain of muscle, fascia and tendon of lower back, subsequent encounter: Secondary | ICD-10-CM | POA: Diagnosis not present

## 2021-03-02 DIAGNOSIS — S300XXD Contusion of lower back and pelvis, subsequent encounter: Secondary | ICD-10-CM | POA: Diagnosis not present

## 2021-03-02 DIAGNOSIS — S39012D Strain of muscle, fascia and tendon of lower back, subsequent encounter: Secondary | ICD-10-CM | POA: Diagnosis not present

## 2021-03-02 DIAGNOSIS — M5416 Radiculopathy, lumbar region: Secondary | ICD-10-CM | POA: Diagnosis not present

## 2021-03-07 DIAGNOSIS — S300XXD Contusion of lower back and pelvis, subsequent encounter: Secondary | ICD-10-CM | POA: Diagnosis not present

## 2021-03-07 DIAGNOSIS — S39012D Strain of muscle, fascia and tendon of lower back, subsequent encounter: Secondary | ICD-10-CM | POA: Diagnosis not present

## 2021-03-07 DIAGNOSIS — M5416 Radiculopathy, lumbar region: Secondary | ICD-10-CM | POA: Diagnosis not present

## 2021-03-08 DIAGNOSIS — M25552 Pain in left hip: Secondary | ICD-10-CM | POA: Diagnosis not present

## 2021-03-08 DIAGNOSIS — M545 Low back pain, unspecified: Secondary | ICD-10-CM | POA: Diagnosis not present

## 2021-03-09 ENCOUNTER — Other Ambulatory Visit: Payer: Self-pay | Admitting: Orthopaedic Surgery

## 2021-03-09 DIAGNOSIS — M545 Low back pain, unspecified: Secondary | ICD-10-CM

## 2021-03-09 DIAGNOSIS — M25552 Pain in left hip: Secondary | ICD-10-CM

## 2021-03-10 ENCOUNTER — Ambulatory Visit
Admission: RE | Admit: 2021-03-10 | Discharge: 2021-03-10 | Disposition: A | Discharge status: Home / Self Care | Payer: Medicare Other | Source: Ambulatory Visit | Attending: Orthopaedic Surgery | Admitting: Orthopaedic Surgery

## 2021-03-10 ENCOUNTER — Other Ambulatory Visit: Payer: Self-pay

## 2021-03-10 DIAGNOSIS — M545 Low back pain, unspecified: Secondary | ICD-10-CM | POA: Diagnosis not present

## 2021-03-10 DIAGNOSIS — M4126 Other idiopathic scoliosis, lumbar region: Secondary | ICD-10-CM | POA: Diagnosis not present

## 2021-03-10 DIAGNOSIS — S32512A Fracture of superior rim of left pubis, initial encounter for closed fracture: Secondary | ICD-10-CM | POA: Diagnosis not present

## 2021-03-10 DIAGNOSIS — M4807 Spinal stenosis, lumbosacral region: Secondary | ICD-10-CM | POA: Diagnosis not present

## 2021-03-10 DIAGNOSIS — M25552 Pain in left hip: Secondary | ICD-10-CM

## 2021-03-10 DIAGNOSIS — M2578 Osteophyte, vertebrae: Secondary | ICD-10-CM | POA: Diagnosis not present

## 2021-03-10 DIAGNOSIS — M48061 Spinal stenosis, lumbar region without neurogenic claudication: Secondary | ICD-10-CM | POA: Diagnosis not present

## 2021-03-13 ENCOUNTER — Other Ambulatory Visit: Payer: Self-pay | Admitting: Orthopaedic Surgery

## 2021-03-13 DIAGNOSIS — M545 Low back pain, unspecified: Secondary | ICD-10-CM

## 2021-03-13 DIAGNOSIS — M25559 Pain in unspecified hip: Secondary | ICD-10-CM

## 2021-03-14 ENCOUNTER — Other Ambulatory Visit: Payer: Self-pay | Admitting: Radiology

## 2021-03-14 ENCOUNTER — Other Ambulatory Visit (HOSPITAL_COMMUNITY): Payer: Self-pay | Admitting: Neuroradiology

## 2021-03-14 ENCOUNTER — Other Ambulatory Visit: Payer: Self-pay | Admitting: Orthopaedic Surgery

## 2021-03-14 DIAGNOSIS — M545 Low back pain, unspecified: Secondary | ICD-10-CM

## 2021-03-14 DIAGNOSIS — M8448XA Pathological fracture, other site, initial encounter for fracture: Secondary | ICD-10-CM

## 2021-03-14 DIAGNOSIS — M25559 Pain in unspecified hip: Secondary | ICD-10-CM

## 2021-03-15 ENCOUNTER — Inpatient Hospital Stay: Admission: RE | Admit: 2021-03-15 | Payer: Medicare Other | Source: Ambulatory Visit

## 2021-03-15 ENCOUNTER — Ambulatory Visit (HOSPITAL_COMMUNITY)
Admission: RE | Admit: 2021-03-15 | Discharge: 2021-03-15 | Disposition: A | Discharge status: Home / Self Care | Payer: Medicare Other | Source: Ambulatory Visit | Attending: Neuroradiology | Admitting: Neuroradiology

## 2021-03-15 ENCOUNTER — Other Ambulatory Visit: Payer: Self-pay

## 2021-03-15 DIAGNOSIS — E039 Hypothyroidism, unspecified: Secondary | ICD-10-CM | POA: Insufficient documentation

## 2021-03-15 DIAGNOSIS — M8008XA Age-related osteoporosis with current pathological fracture, vertebra(e), initial encounter for fracture: Secondary | ICD-10-CM | POA: Diagnosis not present

## 2021-03-15 DIAGNOSIS — M545 Low back pain, unspecified: Secondary | ICD-10-CM | POA: Insufficient documentation

## 2021-03-15 DIAGNOSIS — M25559 Pain in unspecified hip: Secondary | ICD-10-CM | POA: Diagnosis not present

## 2021-03-15 DIAGNOSIS — I10 Essential (primary) hypertension: Secondary | ICD-10-CM | POA: Diagnosis not present

## 2021-03-15 DIAGNOSIS — W1830XA Fall on same level, unspecified, initial encounter: Secondary | ICD-10-CM | POA: Diagnosis not present

## 2021-03-15 DIAGNOSIS — M8448XA Pathological fracture, other site, initial encounter for fracture: Secondary | ICD-10-CM | POA: Diagnosis not present

## 2021-03-15 DIAGNOSIS — Z79899 Other long term (current) drug therapy: Secondary | ICD-10-CM | POA: Diagnosis not present

## 2021-03-15 DIAGNOSIS — S3210XA Unspecified fracture of sacrum, initial encounter for closed fracture: Secondary | ICD-10-CM | POA: Diagnosis not present

## 2021-03-15 HISTORY — PX: IR SACROPLASTY BILATERAL: IMG5561

## 2021-03-15 LAB — CBC
HCT: 34.9 % — ABNORMAL LOW (ref 36.0–46.0)
Hemoglobin: 11.9 g/dL — ABNORMAL LOW (ref 12.0–15.0)
MCH: 33.7 pg (ref 26.0–34.0)
MCHC: 34.1 g/dL (ref 30.0–36.0)
MCV: 98.9 fL (ref 80.0–100.0)
Platelets: 355 10*3/uL (ref 150–400)
RBC: 3.53 MIL/uL — ABNORMAL LOW (ref 3.87–5.11)
RDW: 20.7 % — ABNORMAL HIGH (ref 11.5–15.5)
WBC: 8.5 10*3/uL (ref 4.0–10.5)
nRBC: 0.2 % (ref 0.0–0.2)

## 2021-03-15 LAB — BASIC METABOLIC PANEL
Anion gap: 8 (ref 5–15)
BUN: 25 mg/dL — ABNORMAL HIGH (ref 8–23)
CO2: 25 mmol/L (ref 22–32)
Calcium: 9.4 mg/dL (ref 8.9–10.3)
Chloride: 104 mmol/L (ref 98–111)
Creatinine, Ser: 0.67 mg/dL (ref 0.44–1.00)
GFR, Estimated: >60 mL/min (ref >60)
Glucose, Bld: 94 mg/dL (ref 70–99)
Potassium: 5 mmol/L (ref 3.5–5.1)
Sodium: 137 mmol/L (ref 135–145)

## 2021-03-15 LAB — PROTIME-INR
INR: 1 (ref 0.8–1.2)
Prothrombin Time: 13 seconds (ref 11.4–15.2)

## 2021-03-15 MED ORDER — MIDAZOLAM HCL 2 MG/2ML IJ SOLN
INTRAMUSCULAR | Status: AC
  Filled 2021-03-15: qty 4

## 2021-03-15 MED ORDER — BUPIVACAINE HCL (PF) 0.5 % IJ SOLN
INTRAMUSCULAR | Status: AC
  Filled 2021-03-15: qty 30

## 2021-03-15 MED ORDER — SODIUM CHLORIDE 0.9 % IV SOLN: INTRAVENOUS | Status: DC

## 2021-03-15 MED ORDER — MIDAZOLAM HCL 2 MG/2ML IJ SOLN
INTRAMUSCULAR | Status: AC | PRN
  Administered 2021-03-15 (×2): .5 mg via INTRAVENOUS
  Administered 2021-03-15: 1 mg via INTRAVENOUS
  Administered 2021-03-15: .5 mg via INTRAVENOUS

## 2021-03-15 MED ORDER — LIDOCAINE HCL 1 % IJ SOLN
INTRAMUSCULAR | Status: AC
  Filled 2021-03-15: qty 20

## 2021-03-15 MED ORDER — LIDOCAINE HCL (PF) 1 % IJ SOLN
INTRAMUSCULAR | Status: AC | PRN
  Administered 2021-03-15: 10 mL

## 2021-03-15 MED ORDER — CEFAZOLIN SODIUM-DEXTROSE 2-4 GM/100ML-% IV SOLN
INTRAVENOUS | Status: AC
  Filled 2021-03-15: qty 100

## 2021-03-15 MED ORDER — CEFAZOLIN SODIUM-DEXTROSE 2-4 GM/100ML-% IV SOLN
2.0000 g | INTRAVENOUS | Status: AC
  Administered 2021-03-15: 12:00:00 2 g via INTRAVENOUS

## 2021-03-15 MED ORDER — BUPIVACAINE HCL (PF) 0.5 % IJ SOLN
INTRAMUSCULAR | Status: AC | PRN
  Administered 2021-03-15: 10 mL

## 2021-03-15 MED ORDER — FENTANYL CITRATE (PF) 100 MCG/2ML IJ SOLN
INTRAMUSCULAR | Status: AC
  Filled 2021-03-15: qty 4

## 2021-03-15 MED ORDER — FENTANYL CITRATE (PF) 100 MCG/2ML IJ SOLN
INTRAMUSCULAR | Status: AC | PRN
Start: 2021-03-15 — End: 2021-03-15
  Administered 2021-03-15 (×5): 25 ug via INTRAVENOUS

## 2021-03-15 NOTE — Procedures (Signed)
INTERVENTIONAL NEURORADIOLOGY BRIEF POSTPROCEDURE NOTE  FLUOROSCOPY GUIDED SACROPLASTY   Attending: Dr. Pedro Earls  Assistant: None.  Diagnosis: Bilateral sacral insufficiency fractures.   Access site: Percutaneous   Anesthesia: Moderate sedation   Medication used: 2.5 mg Versed IV; 125 mcg Fentanyl IV.  Complications: None.   Estimated blood loss: Negligible.   Specimen: None.   Successful bilateral fluoroscopy guided sacroplasty.   The patient tolerated the procedure well without incident or complication and is in stable condition.

## 2021-03-15 NOTE — H&P (Signed)
Chief Complaint: Patient was seen in consultation today for sacroplasty at the request of Melrose Nakayama  Referring Physician(s): Dr. Melrose Nakayama  Supervising Physician: Katherina Right Lyla Glassing  Patient Status: [facility] - Out-pt  History of Present Illness: Breanna [name] is an active 87 y.o. female with history of HTN, hypothyroidism, osteonecrosis of jaw, and osteoporosis.  She reports a controlled ground level fall on 01/29/21.  Over the next few days she developed pain.  She was seen by chiropractic, Ortho, and PT and only had increasing pain and debility.   MR reveals bilateral sacral alar fractures and fracture at the parasymphyseal aspect of the left pubic bone. Pt presents for bilateral sacroplasty.  She is eager to have the procedure with hopes of pain relief and resuming activity.   Past Medical History:  Diagnosis Date   Hypertension    Hypothyroidism    Osteonecrosis of jaw due to drug ([place]) 2022   lower right mandible, tooth #30   Osteoporosis, senile 10/2017   T score -3.0.  Overall stable/improved over prior study on Prolia   Vertigo 02/2012   Work up at [facility] for stroke-tests negative, no further problem    Past Surgical History:  Procedure Laterality Date   BACK SURGERY  2006   CYSTOCELE REPAIR N/A 08/19/2012   Procedure: ANTERIOR REPAIR (CYSTOCELE);  Surgeon: Reece Packer, MD;  Location: [facility];  Service: Urology;  Laterality: N/A;   CYSTOSCOPY N/A 08/19/2012   Procedure: CYSTOSCOPY;  Surgeon: Reece Packer, MD;  Location: [facility];  Service: Urology;  Laterality: N/A;   EYE SURGERY     ROBOTIC ASSISTED LAPAROSCOPIC SACROCOLPOPEXY  2016   [facility] - Dr. Lurena Nida   SALPINGOOPHORECTOMY Bilateral 08/19/2012   Procedure: SALPINGO OOPHORECTOMY;  Surgeon: Marvene Staff, MD;  Location: [facility];  Service: Gynecology;  Laterality: Bilateral;   TONSILLECTOMY     TUBAL LIGATION     VAGINAL HYSTERECTOMY N/A 08/19/2012   Procedure: HYSTERECTOMY  VAGINAL;  Surgeon: Marvene Staff, MD;  Location: [facility];  Service: Gynecology;  Laterality: N/A;   VAGINAL PROLAPSE REPAIR N/A 08/19/2012   Procedure: VAULT PROLAPSE WITH GRAFT;  Surgeon: Reece Packer, MD;  Location: [facility];  Service: Urology;  Laterality: N/A;    Allergies: Anesthetics, amide; Percocet [oxycodone-acetaminophen]; and Sulfa antibiotics  Medications: Prior to Admission medications   Medication Sig Start Date End Date Taking? Authorizing Provider  acetaminophen (TYLENOL) 500 MG tablet Take 1,000 mg by mouth every 6 (six) hours as needed for mild pain.   Yes [provider]  Calcium-Vitamin D (CALTRATE 600 PLUS-VIT D PO) Take 1 tablet by mouth 2 (two) times daily.   Yes [provider]  chlorhexidine (PERIDEX) 0.12 % solution SMARTSIG:By Mouth 08/31/20  Yes [provider]  Cholecalciferol 1000 UNITS tablet Take 1,000 Units by mouth daily.   Yes [provider]  estradiol (ESTRACE) 0.1 MG/GM vaginal cream Use a peas size the the vaginal opening and urethra two times a week. 11/16/20  Yes Nunzio Cobbs, MD  felodipine (PLENDIL) 10 MG 24 hr tablet Take 5 mg by mouth daily.    Yes [provider]  finasteride (PROSCAR) 5 MG tablet Take 2.5 mg by mouth daily. 08/11/20  Yes [provider]  levothyroxine (SYNTHROID, LEVOTHROID) 88 MCG tablet Take 88 mcg by mouth daily before breakfast.   Yes [provider]  lisinopril (PRINIVIL,ZESTRIL) 10 MG tablet Take by mouth.   Yes [provider]  Multiple Vitamins-Minerals ([facility] MACULAR HEALTH) MISC Take 1 tablet by mouth daily.   Yes [provider]  nystatin-triamcinolone ointment (MYCOLOG) Apply 1 application topically 2 (two) times daily. 09/04/18  Yes Fontaine, Belinda Block, MD  Polyethyl Glycol-Propyl Glycol (SYSTANE OP) Apply 2 drops to eye daily as needed (for dry eyes.).   Yes [provider]  polyethylene glycol (MIRALAX / GLYCOLAX)  packet Take 17 g by mouth daily.   Yes [provider]  clobetasol cream (TEMOVATE) 6.31 % Apply 1 application topically 2 (two) times daily as needed. 11/19/19   Joseph Pierini, MD     Family History  Problem Relation Age of Onset   Stroke Father    Cancer - Colon Mother     Social History   Socioeconomic History   Marital status: Widowed    Spouse name: Not on file   Number of children: Not on file   Years of education: Not on file   Highest education level: Not on file  Occupational History   Not on file  Tobacco Use   Smoking status: Never   Smokeless tobacco: Never  Vaping Use   Vaping Use: Never used  Substance and Sexual Activity   Alcohol use: Yes    Alcohol/week: 3.0 standard drinks    Types: 3 Glasses of wine per week    Comment: socially drinks wine   Drug use: No   Sexual activity: Not Currently    Comment: 1st intercourse 87 yo-1 partner  Other Topics Concern   Not on file  Social History Narrative   Not on file   Social Determinants of Health   Financial Resource Strain: Not on file  Food Insecurity: Not on file  Transportation Needs: Not on file  Physical Activity: Not on file  Stress: Not on file  Social Connections: Not on file    Review of Systems  Constitutional: Negative.   HENT: Negative.    Eyes: Negative.   Respiratory: Negative.    Cardiovascular: Negative.   Gastrointestinal: Negative.   Endocrine: Negative.   Genitourinary: Negative.   Musculoskeletal: Negative.   Allergic/Immunologic: Negative.   Neurological: Negative.   Hematological: Negative.   Psychiatric/Behavioral: Negative.     Vital Signs: BP (!) 151/66 (BP Location: Right Arm)    Pulse 83    Temp 97.8 F (36.6 C) (Oral)    Resp 12    Ht 5' (1.524 m)    Wt 120 lb (54.4 kg)    SpO2 97%    BMI 23.44 kg/m   Physical Exam Constitutional:      Appearance: Normal appearance.  HENT:     Head: Normocephalic and atraumatic.     Nose: Nose normal.      Mouth/Throat:     Mouth: Mucous membranes are moist.     Pharynx: Oropharynx is clear.  Eyes:     Extraocular Movements: Extraocular movements intact.  Cardiovascular:     Rate and Rhythm: Normal rate and regular rhythm.     Pulses: Normal pulses.     Heart sounds: Normal heart sounds.  Pulmonary:     Effort: Pulmonary effort is normal.     Breath sounds: Normal breath sounds.  Abdominal:     General: Abdomen is flat.     Palpations: Abdomen is soft.  Skin:    General: Skin is warm and dry.     Capillary Refill: Capillary refill takes 2 to 3 seconds.  Neurological:     General: No focal deficit  present.     Mental Status: She is alert and oriented to person, place, and time.  Psychiatric:        Mood and Affect: Mood normal.        Thought Content: Thought content normal.        Judgment: Judgment normal.    Imaging: MR LUMBAR SPINE WO CONTRAST  Result Date: 03/10/2021 CLINICAL DATA:  87 year old female with low back pain radiating to the left hip for 1 month with no known injury. EXAM: MRI LUMBAR SPINE WITHOUT CONTRAST TECHNIQUE: Multiplanar, multisequence MR imaging of the lumbar spine was performed. No intravenous contrast was administered. COMPARISON:  Intraoperative lumbar radiograph 11/21/2004. Lumbar MRI 11/03/2004. CT Abdomen and Pelvis 06/03/2014. FINDINGS: Segmentation:  Normal on the 2016 comparison. Alignment: Lumbar lordosis has not significantly changed since 2006. But mild dextroconvex lumbar scoliosis has developed. There is subtle anterolisthesis now of L5 on S1. Vertebrae: Confluent abnormal marrow signal in the bilateral sacral ala (series 7, image 38) with STIR hyperintensity is consistent with marrow edema in the setting of sacral insufficiency fractures. This appears slightly more intense on the left. The central S2 vertebra is also affected. See series 4, images 10 and 16). Normal background marrow signal in the visible pelvis, thoracic and lumbar vertebrae.  However, there is also confluent marrow edema in the left L5 pedicle (series 4, image 16). See additional details of that level below. Superimposed chronic degenerative endplate marrow signal changes at L4-L5 eccentric to the right. Less pronounced degenerative endplate marrow changes at L2-L3 eccentric to the left. Conus medullaris and cauda equina: Conus extends to the T12-L1 level. No lower spinal cord or conus signal abnormality. Paraspinal and other soft tissues: Negative. Disc levels: No spinal stenosis from the visible lower thoracic levels through T12-L1. There is a small central disc protrusion at the latter (series 6, image 6). L1-L2:  Disc bulging without spinal stenosis. L2-L3: Disc space loss since 2006 with circumferential disc osteophyte complex mostly affecting the foramina. There is mild to moderate left and mild right L2 foraminal stenosis. L3-L4: Disc desiccation and disc bulging is new or increased since 2006 with superimposed moderate ligament flavum and mild facet hypertrophy. Subsequent mild new multifactorial spinal stenosis, mild to moderate bilateral L3 foraminal stenosis. L4-L5: Disc space loss since 2006 with circumferential disc bulge and endplate spurring. Moderate to severe facet and ligament flavum hypertrophy. Subsequent severe spinal stenosis is new (series 6, image 28) with bilateral lateral recess stenosis. There is moderate to severe right but only mild left L4 foraminal stenosis. L5-S1: Mild anterolisthesis. Mild disc bulging. Moderate facet and ligament flavum hypertrophy. Mild spinal stenosis. Mild to moderate lateral recess stenosis greater on the right (right S1 nerve level). No foraminal stenosis. IMPRESSION: 1. Symptomatic abnormality favored to be acute or subacute bilateral sacral insufficiency fractures, with active sacral marrow edema which appears somewhat greater on the left. 2. Additionally there is marrow edema in the left L5 pedicle, which is likely reactive  secondary to #1. 3. Lumbar spine degeneration since the 2006 MRI with multifactorial: - Severe lumbar spinal stenosis at L4-L5, with moderate to severe right L4 neural foraminal stenosis. - mild spinal and moderate right lateral recess stenosis at L5-S1 (right S1 nerve level). - mild spinal stenosis at L3-L4, with up to moderate left L2 and bilateral L3 foraminal stenosis. Electronically Signed   By: Genevie Ann M.D.   On: 03/10/2021 11:03   MR HIP LEFT WO CONTRAST  Result Date: 03/10/2021 CLINICAL DATA:  Low back pain radiating to the left hip for 1 month. No known injury. EXAM: MR OF THE LEFT HIP WITHOUT CONTRAST TECHNIQUE: Multiplanar, multisequence MR imaging was performed. No intravenous contrast was administered. COMPARISON:  CT 06/03/2014 FINDINGS: Bones: Acute bilateral sacral alar fractures with extensive marrow edema (series 4, images 5-7). Bilateral SI joints remain intact without diastasis. No SI joint effusion. Nondisplaced fracture involving the parasymphyseal aspect of the left pubic bone with associated bone marrow edema (series 5, image 24). There is also marrow edema within the parasymphyseal aspect of the right pubic bone without a well-defined fracture line. Pubic symphysis intact. Remaining visualized osseous structures are intact. No dislocation. No femoral head avascular necrosis. Articular cartilage and labrum Articular cartilage: No focal cartilage defect or subchondral marrow signal change. Labrum: Suboptimally evaluated in the absence of intra-articular contrast. No paralabral abnormality. Joint or bursal effusion Joint effusion:  No significant effusion. Bursae: No abnormal bursal fluid collection. Muscles and tendons Muscles and tendons: Prominent intramuscular edema within the bilateral proximal adductor muscle compartments, left worse than right. Remaining muscle signal is otherwise within normal limits. Tendinous structures about the left hip are intact. Other findings Miscellaneous:  No inguinal lymphadenopathy. No organized fluid collections. Tortuosity of the distal abdominal aorta. IMPRESSION: 1. Acute bilateral sacral alar fractures with extensive marrow edema. 2. Acute fracture of the parasymphyseal aspect of the left pubic bone. 3. Marrow edema within the parasymphyseal aspect of the right pubic bone without a well-defined fracture line. Findings favored to represent developing insufficiency fracture. 4. Prominent intramuscular edema within the bilateral proximal adductor muscle compartments, left worse than right. 5. No acute fracture, dislocation, or avascular necrosis of the left hip. These results will be called to the ordering clinician or representative by the Radiologist Assistant, and communication documented in the PACS or [employer]. Electronically Signed   By: Davina Poke D.O.   On: 03/10/2021 11:01    Labs:  CBC: No results for input(s): WBC, HGB, HCT, PLT in the last 8760 hours.  COAGS: No results for input(s): INR, APTT in the last 8760 hours.  BMP: Recent Labs    04/21/20 1350  CALCIUM 9.7    LIVER FUNCTION TESTS: No results for input(s): BILITOT, AST, ALT, ALKPHOS, PROT, ALBUMIN in the last 8760 hours.  TUMOR MARKERS: No results for input(s): AFPTM, CEA, CA199, CHROMGRNA in the last 8760 hours.  Assessment and Plan:  Bilateral sacral fractures  --for sacroplasty today pending labs --plan for d/c this afternoon --is NPO, agreeable to proceed, has ride home   Risks and benefits of bilateral sacroplasty were discussed with the patient including, but not limited to education regarding the natural healing process of compression fractures without intervention, bleeding, infection, cement migration which may cause spinal cord damage, paralysis, pulmonary embolism or even death.  This interventional procedure involves the use of X-rays and because of the nature of the planned procedure, it is possible that we will have prolonged use of  X-ray fluoroscopy.  Potential radiation risks to you include (but are not limited to) the following: - A slightly elevated risk for cancer  several years later in life. This risk is typically less than 0.5% percent. This risk is low in comparison to the normal incidence of human cancer, which is 33% for women and 50% for men according to the [place]. - Radiation induced injury can include skin redness, resembling a rash, tissue breakdown / ulcers and hair loss (which can be temporary or permanent).  The likelihood of either of these occurring depends on the difficulty of the procedure and whether you are sensitive to radiation due to previous procedures, disease, or genetic conditions.   IF your procedure requires a prolonged use of radiation, you will be notified and given written instructions for further action.  It is your responsibility to monitor the irradiated area for the 2 weeks following the procedure and to notify your physician if you are concerned that you have suffered a radiation induced injury.    All of the patient's questions were answered, patient is agreeable to proceed.  Consent signed and in chart.    Thank you for this interesting consult.  I greatly enjoyed meeting LALLA LAHAM and look forward to participating in their care.  A copy of this report was sent to the requesting provider on this date.  Electronically Signed: Pasty Spillers, PA 03/15/2021, 11:08 AM   I spent a total of 40 minutes in face to face in clinical consultation, greater than 50% of which was counseling/coordinating care for sacroplasty

## 2021-03-16 ENCOUNTER — Telehealth: Payer: Self-pay

## 2021-03-16 NOTE — Telephone Encounter (Signed)
Daughter states pt. Had sacroplasty yesterday and has increased drainage at site. Was instructed to call if this occurs, but does not have a phone number. Warm transfer to [facility] in Radiology.

## 2021-03-17 DIAGNOSIS — M25552 Pain in left hip: Secondary | ICD-10-CM | POA: Diagnosis not present

## 2021-03-20 NOTE — Progress Notes (Signed)
ok 

## 2021-03-22 ENCOUNTER — Other Ambulatory Visit: Payer: Self-pay

## 2021-03-22 ENCOUNTER — Ambulatory Visit (INDEPENDENT_AMBULATORY_CARE_PROVIDER_SITE_OTHER): Payer: Medicare Other | Admitting: Endocrinology

## 2021-03-22 ENCOUNTER — Encounter: Payer: Self-pay | Admitting: Endocrinology

## 2021-03-22 VITALS — BP 122/58 | HR 83 | Wt 121.6 lb

## 2021-03-22 DIAGNOSIS — E559 Vitamin D deficiency, unspecified: Secondary | ICD-10-CM | POA: Diagnosis not present

## 2021-03-22 DIAGNOSIS — E063 Autoimmune thyroiditis: Secondary | ICD-10-CM

## 2021-03-22 DIAGNOSIS — M8000XD Age-related osteoporosis with current pathological fracture, unspecified site, subsequent encounter for fracture with routine healing: Secondary | ICD-10-CM | POA: Diagnosis not present

## 2021-03-22 LAB — HEPATIC FUNCTION PANEL
ALT: 17 U/L (ref 0–35)
AST: 27 U/L (ref 0–37)
Albumin: 3.9 g/dL (ref 3.5–5.2)
Alkaline Phosphatase: 147 U/L — ABNORMAL HIGH (ref 39–117)
Bilirubin, Direct: 0.1 mg/dL (ref 0.0–0.3)
Total Bilirubin: 0.6 mg/dL (ref 0.2–1.2)
Total Protein: 6.7 g/dL (ref 6.0–8.3)

## 2021-03-22 LAB — T4, FREE: Free T4: 2.01 ng/dL — ABNORMAL HIGH (ref 0.60–1.60)

## 2021-03-22 LAB — VITAMIN D 25 HYDROXY (VIT D DEFICIENCY, FRACTURES): VITD: 90.52 ng/mL (ref 30.00–100.00)

## 2021-03-22 LAB — TSH: TSH: 7.71 u[IU]/mL — ABNORMAL HIGH (ref 0.35–5.50)

## 2021-03-22 NOTE — Progress Notes (Signed)
Patient ID: Breanna [name], female   DOB: 06-20-1930, 87 y.o.   MRN: [id number]           Referring Physician: Aundria Rud, MD   Chief complaint:  History of Present Illness:  The patient is referred here for osteoporosis.  She has had osteoporosis diagnosed by bone density through her gynecologist in 2015 which showed osteoporosis at the femoral neck.  This was the lowest T score at that time Her baseline T score was -2.8    Not clear what her current height is but by her history she has lost about 1-1/2 inches in height  Her records indicate that she was started on Prolia in 2017 This was continued every 6 months until 04/2020  Her dose of Prolia in 10/2020 was deferred because of continuing dental issues although history does not indicate that she had osteonecrosis  With her Prolia regimen her bone density continues to be relatively low with the last LOWEST bone density of the right femoral neck to be  3.2 as of 11/2019.  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  Overall stable/improved over prior study on Prolia   Vertigo 02/2012   Work up at [facility] for stroke-tests negative, no further problem    Past Surgical History:  Procedure Laterality Date   BACK SURGERY  2006   CYSTOCELE REPAIR N/A 08/19/2012   Procedure: ANTERIOR REPAIR (CYSTOCELE);  Surgeon: Reece Packer, MD;  Location: [facility];  Service: Urology;  Laterality: N/A;   CYSTOSCOPY N/A 08/19/2012   Procedure: CYSTOSCOPY;  Surgeon: Reece Packer, MD;  Location: [facility];  Service: Urology;  Laterality:  N/A;   EYE SURGERY     IR SACROPLASTY BILATERAL  03/15/2021   ROBOTIC ASSISTED LAPAROSCOPIC SACROCOLPOPEXY  2016   [facility] - Dr. Lurena Nida   SALPINGOOPHORECTOMY Bilateral 08/19/2012   Procedure: SALPINGO OOPHORECTOMY;  Surgeon: Marvene Staff, MD;  Location: [facility];  Service: Gynecology;  Laterality: Bilateral;   TONSILLECTOMY     TUBAL LIGATION     VAGINAL HYSTERECTOMY N/A 08/19/2012   Procedure: HYSTERECTOMY VAGINAL;  Surgeon: Marvene Staff, MD;  Location: [facility];  Service: Gynecology;  Laterality: N/A;   VAGINAL PROLAPSE REPAIR N/A 08/19/2012   Procedure: VAULT PROLAPSE WITH GRAFT;  Surgeon: Reece Packer, MD;  Location: [facility];  Service: Urology;  Laterality: N/A;    Family History  Problem Relation Age of Onset   Stroke Father    Cancer - Colon Mother     Social History:  reports that she has never smoked. She has never used smokeless tobacco. She reports current alcohol use of about 3.0 standard drinks per week. She reports that she does not use drugs.  Allergies:  Allergies  Allergen Reactions   Anesthetics, Amide Nausea And Vomiting    Also causes constipation.    Percocet [Oxycodone-Acetaminophen] Nausea And Vomiting   Sulfa Antibiotics Nausea And Vomiting    Allergies as of 03/22/2021       Reactions  Anesthetics, Amide Nausea And Vomiting   Also causes constipation.    Percocet [oxycodone-acetaminophen] Nausea And Vomiting   Sulfa Antibiotics Nausea And Vomiting        Medication List        Accurate as of March 22, 2021 10:44 AM. If you have any questions, ask your nurse or doctor.          acetaminophen 500 MG tablet Commonly known as: TYLENOL Take 1,000 mg by mouth every 6 (six) hours as needed for mild pain.   Biotin 5000 MCG Tabs Take by mouth.   CALTRATE 600 PLUS-VIT D PO Take 1 tablet by mouth 2 (two) times daily.   chlorhexidine 0.12 % solution Commonly known as: PERIDEX SMARTSIG:By Mouth   Vitamin D-3 5000 UNIT/ML  Liqd 1 tablet   Cholecalciferol 25 MCG (1000 UT) tablet Take 1,000 Units by mouth daily.   clobetasol cream 0.05 % Commonly known as: TEMOVATE Apply 1 application topically 2 (two) times daily as needed.   estradiol 0.1 MG/GM vaginal cream Commonly known as: ESTRACE Use a peas size the the vaginal opening and urethra two times a week.   felodipine 10 MG 24 hr tablet Commonly known as: PLENDIL Take 5 mg by mouth daily.   finasteride 5 MG tablet Commonly known as: PROSCAR Take 2.5 mg by mouth daily.   levothyroxine 88 MCG tablet Commonly known as: SYNTHROID Take 88 mcg by mouth daily before breakfast.   lisinopril 10 MG tablet Commonly known as: ZESTRIL Take by mouth.   [place] Macular Health Misc Take 1 tablet by mouth daily.   nystatin-triamcinolone ointment Commonly known as: MYCOLOG Apply 1 application topically 2 (two) times daily.   polyethylene glycol 17 g packet Commonly known as: MIRALAX / GLYCOLAX Take 17 g by mouth daily.   SYSTANE OP Apply 2 drops to eye daily as needed (for dry eyes.).         Review of Systems  Constitutional:  Negative for weight loss and reduced appetite.  HENT:  Negative for trouble swallowing.        She was told she had a growth in her mid jaw area treated surgically including tooth extraction.  Records not available and no indication that she had osteonecrosis  Respiratory:  Negative for shortness of breath.   Cardiovascular:  Positive for leg swelling. Negative for chest pain.  Gastrointestinal:  Negative for abdominal pain.  Endocrine: Negative for fatigue.       She has had hypothyroidism reportedly for several years treated by her PCP, she does not remember symptoms prior to starting treatment.  Last TSH was normal in 11/2019 from PCP office  Musculoskeletal:  Positive for back pain.  Neurological:  Negative for numbness.    No results found for: TSH, FREET4   LABS:  No visits with results within 1 Week(s) from this  visit.  Latest known visit with results is:  Hospital Outpatient Visit on 03/15/2021  Component Date Value Ref Range Status   WBC 03/15/2021 8.5  4.0 - 10.5 K/uL Final   RBC 03/15/2021 3.53 (L)  3.87 - 5.11 MIL/uL Final   Hemoglobin 03/15/2021 11.9 (L)  12.0 - 15.0 g/dL Final   HCT 03/15/2021 34.9 (L)  36.0 - 46.0 % Final   MCV 03/15/2021 98.9  80.0 - 100.0 fL Final   MCH 03/15/2021 33.7  26.0 - 34.0 pg Final   MCHC 03/15/2021 34.1  30.0 - 36.0 g/dL Final   RDW 03/15/2021 20.7 (H)  11.5 -  15.5 % Final   Platelets 03/15/2021 355  150 - 400 K/uL Final   nRBC 03/15/2021 0.2  0.0 - 0.2 % Final   Performed at [facility], [place] [address], [place], [place] [address]   Prothrombin Time 03/15/2021 13.0  11.4 - 15.2 seconds Final   INR 03/15/2021 1.0  0.8 - 1.2 Final   Comment: (NOTE) INR goal varies based on device and disease states. Performed at [facility], [place] [address], [place], [place] [address]    Sodium 03/15/2021 137  135 - 145 mmol/L Final   Potassium 03/15/2021 5.0  3.5 - 5.1 mmol/L Final   Chloride 03/15/2021 104  98 - 111 mmol/L Final   CO2 03/15/2021 25  22 - 32 mmol/L Final   Glucose, Bld 03/15/2021 94  70 - 99 mg/dL Final   Glucose reference range applies only to samples taken after fasting for at least 8 hours.   BUN 03/15/2021 25 (H)  8 - 23 mg/dL Final   Creatinine, Ser 03/15/2021 0.67  0.44 - 1.00 mg/dL Final   Calcium 03/15/2021 9.4  8.9 - 10.3 mg/dL Final   GFR, Estimated 03/15/2021 >60  >60 mL/min Final   Comment: (NOTE) Calculated using the CKD-EPI Creatinine Equation (2021)    Anion gap 03/15/2021 8  5 - 15 Final   Performed at [facility], [place] [address], [place], [place] [address]     PHYSICAL EXAM:  BP (!) 122/58    Pulse 83    Wt 121 lb 9.6 oz (55.2 kg)    SpO2 98%    BMI 23.75 kg/m     GENERAL: Relatively asthenic build Alert, pleasant and cooperative  No pallor, clubbing, lymphadenopathy   Skin:  no rash or  pigmentation.  EYES:  Externally normal.    ENT: Oral exam not indicated  THYROID:  Not palpable.  HEART:  Normal  S1 and S2; no murmur or click.  CHEST:  Normal shape.  Lungs: Vescicular breath sounds heard equally.  No crepitations/ wheeze.  ABDOMEN:  No distention.  Liver and spleen not palpable.  No other mass or tenderness.  JOINTS:  Normal.  SPINE: Normal shape and no kyphosis, no tenderness  NEUROLOGICAL: .Reflexes are normal bilaterally at biceps.  Extremities: 1+ ankle edema present  ASSESSMENT:   OSTEOPOROSIS, postmenopausal  Her bone density has been historically low at the femoral neck She was started on Prolia treatment in 2019 but despite continued treatment her bone density was lower in 2021 compared to 2019 with T score of -3.2 Also recently has sustained a sacral fracture Prolia has been withheld recently because of unknown dental issues, apparently not osteonecrosis   Reportedly has HYPOTHYROIDISM taking thyroid supplements long-term No recent TSH level available   PLAN:   Check vitamin D level and thyroid level today  We will try to see if she can be approved for Evenity as an anabolic agent, this will provide better results than Prolia which has not prevented her fracture or showing improvement in bone density However if this is not approved can try be tried on generic Forteo.  Discussed nature of this medication, how it works and need for daily injection.  Patient information given on Forteo and she will likely need treatment for 18 months.  She may be able to get this done immediately at her assisted living facility from the nurse there She will continue calcium and vitamin D   Consultation note sent to referring  physician   Elayne Snare 03/22/2021, 10:44 AM    Her vitamin D level is on the high side at 90.  She can stop the additional 1000 units of vitamin D3 she is taking. Also her TSH is over 7 .  Need to confirm she is taking levothyroxine  before breakfast separately from her calcium tablets.  If so Will need to change her levothyroxine from 21mcg to 100 mcg daily  Breanna [name]

## 2021-03-23 MED ORDER — LEVOTHYROXINE SODIUM 100 MCG PO TABS
100.0000 ug | ORAL_TABLET | Freq: Every day | ORAL | 3 refills | Status: DC
Start: 2021-03-23 — End: 2022-03-14

## 2021-03-23 NOTE — Addendum Note (Signed)
Addended by: Cinda Quest on: 03/23/2021 11:36 AM   Modules accepted: Orders

## 2021-03-29 ENCOUNTER — Telehealth: Payer: Self-pay

## 2021-03-29 NOTE — Telephone Encounter (Signed)
Evenity VOB initiated via [email or web address]  Last OV:  Next OV:  Last Evenity inj: NEW START Next Evenity inj DUE:

## 2021-04-05 ENCOUNTER — Telehealth: Payer: Self-pay

## 2021-04-05 NOTE — Telephone Encounter (Signed)
Received message from after hours nurse that patient was calling to speak with me. Tried calling patient back no answer. Left vm to callback.

## 2021-04-07 DIAGNOSIS — M545 Low back pain, unspecified: Secondary | ICD-10-CM | POA: Diagnosis not present

## 2021-04-11 NOTE — Telephone Encounter (Signed)
Patient called inquiring about treatment for osteoporosis. Please provide update on which medication should be taken and the total cost to patient. Patient mentioned that she had reaction to Prolia and Provider was going to provide alternate medication. I see by your note the medication listed is Evenity. Please advise patient at [phone] or [phone].

## 2021-04-11 NOTE — Telephone Encounter (Signed)
I tried calling patient back but no answer. Left detailed vm to inform patient that we are still waiting for approval for the Evenity and no updated information at this time. Informed that we will reach out to her once we get more information.

## 2021-04-12 NOTE — Telephone Encounter (Signed)
Prior auth required for [employer])  PA PROCESS DETAILS: PA is required for (Buy and Bill/ Specialty Pharmacy) & is not on file. Providers may call Medical Utilization at [phone] to initiate. Forms may be accessed online at [email or web address] & faxed back to [phone]- [phone] ([place] members) or [phone] [facility] members) .

## 2021-04-12 NOTE — Telephone Encounter (Signed)
Prior auth initiated via [email or web address] KEY: [id number]

## 2021-04-12 NOTE — Telephone Encounter (Signed)
Patient called to follow up on her Evenity. Checking with you to see if we have any updates.

## 2021-04-13 DIAGNOSIS — R2689 Other abnormalities of gait and mobility: Secondary | ICD-10-CM | POA: Diagnosis not present

## 2021-04-13 DIAGNOSIS — M4848XD Fatigue fracture of vertebra, sacral and sacrococcygeal region, subsequent encounter for fracture with routine healing: Secondary | ICD-10-CM | POA: Diagnosis not present

## 2021-04-17 DIAGNOSIS — M4848XD Fatigue fracture of vertebra, sacral and sacrococcygeal region, subsequent encounter for fracture with routine healing: Secondary | ICD-10-CM | POA: Diagnosis not present

## 2021-04-17 DIAGNOSIS — R2689 Other abnormalities of gait and mobility: Secondary | ICD-10-CM | POA: Diagnosis not present

## 2021-04-19 DIAGNOSIS — R2689 Other abnormalities of gait and mobility: Secondary | ICD-10-CM | POA: Diagnosis not present

## 2021-04-19 DIAGNOSIS — M4848XD Fatigue fracture of vertebra, sacral and sacrococcygeal region, subsequent encounter for fracture with routine healing: Secondary | ICD-10-CM | POA: Diagnosis not present

## 2021-04-20 ENCOUNTER — Telehealth: Payer: Self-pay | Admitting: *Deleted

## 2021-04-20 NOTE — Telephone Encounter (Signed)
Patient removed from [facility] reminder folder. Now receiving Evenity through Endocrinology.   Encounter closed.

## 2021-04-20 NOTE — Telephone Encounter (Signed)
-----   Message from Burnice Logan, RN sent at 04/18/2021  4:51 PM EST ----- Regarding: FW: Prolia was not given today Per review of epic, patient was seen by Dr. Dwyane Dee endocrinology 03/2021 and they have started process for Evenity. Raquel Sarna, you may want to make note on her page in the [place] so that we don't continue to renew Prolia.   Sharee Pimple   ----- Message ----- From: Nunzio Cobbs, MD Sent: 11/16/2020   7:01 PM EST To: Burnice Logan, RN Subject: [place] was not given today                     Warden Fillers,   I recommended the patient not receive Prolia today. The injection was not given.   She is having significant dental issues.   I have referred back to Dr. Dwyane Dee, the endocrinologist who has seen her in the past.  Just letting you know from a clinical perspective and a financial perspective.   [place]

## 2021-04-21 DIAGNOSIS — R2689 Other abnormalities of gait and mobility: Secondary | ICD-10-CM | POA: Diagnosis not present

## 2021-04-21 DIAGNOSIS — M4848XD Fatigue fracture of vertebra, sacral and sacrococcygeal region, subsequent encounter for fracture with routine healing: Secondary | ICD-10-CM | POA: Diagnosis not present

## 2021-04-24 DIAGNOSIS — M4848XD Fatigue fracture of vertebra, sacral and sacrococcygeal region, subsequent encounter for fracture with routine healing: Secondary | ICD-10-CM | POA: Diagnosis not present

## 2021-04-24 DIAGNOSIS — R2689 Other abnormalities of gait and mobility: Secondary | ICD-10-CM | POA: Diagnosis not present

## 2021-04-25 DIAGNOSIS — H43812 Vitreous degeneration, left eye: Secondary | ICD-10-CM | POA: Diagnosis not present

## 2021-04-25 DIAGNOSIS — H35371 Puckering of macula, right eye: Secondary | ICD-10-CM | POA: Diagnosis not present

## 2021-04-25 DIAGNOSIS — H353114 Nonexudative age-related macular degeneration, right eye, advanced atrophic with subfoveal involvement: Secondary | ICD-10-CM | POA: Diagnosis not present

## 2021-04-25 DIAGNOSIS — H353122 Nonexudative age-related macular degeneration, left eye, intermediate dry stage: Secondary | ICD-10-CM | POA: Diagnosis not present

## 2021-04-26 DIAGNOSIS — M4848XD Fatigue fracture of vertebra, sacral and sacrococcygeal region, subsequent encounter for fracture with routine healing: Secondary | ICD-10-CM | POA: Diagnosis not present

## 2021-04-26 DIAGNOSIS — R2689 Other abnormalities of gait and mobility: Secondary | ICD-10-CM | POA: Diagnosis not present

## 2021-05-01 DIAGNOSIS — R2689 Other abnormalities of gait and mobility: Secondary | ICD-10-CM | POA: Diagnosis not present

## 2021-05-01 DIAGNOSIS — M4848XD Fatigue fracture of vertebra, sacral and sacrococcygeal region, subsequent encounter for fracture with routine healing: Secondary | ICD-10-CM | POA: Diagnosis not present

## 2021-05-02 NOTE — Telephone Encounter (Addendum)
Next Steps To follow up, call the plan at [phone]

## 2021-05-02 NOTE — Telephone Encounter (Deleted)
Prior auth pending

## 2021-05-03 DIAGNOSIS — M545 Low back pain, unspecified: Secondary | ICD-10-CM | POA: Diagnosis not present

## 2021-05-03 DIAGNOSIS — M4848XD Fatigue fracture of vertebra, sacral and sacrococcygeal region, subsequent encounter for fracture with routine healing: Secondary | ICD-10-CM | POA: Diagnosis not present

## 2021-05-03 DIAGNOSIS — R2689 Other abnormalities of gait and mobility: Secondary | ICD-10-CM | POA: Diagnosis not present

## 2021-05-04 DIAGNOSIS — R2689 Other abnormalities of gait and mobility: Secondary | ICD-10-CM | POA: Diagnosis not present

## 2021-05-04 DIAGNOSIS — M4848XD Fatigue fracture of vertebra, sacral and sacrococcygeal region, subsequent encounter for fracture with routine healing: Secondary | ICD-10-CM | POA: Diagnosis not present

## 2021-05-05 DIAGNOSIS — Z79899 Other long term (current) drug therapy: Secondary | ICD-10-CM | POA: Diagnosis not present

## 2021-05-05 DIAGNOSIS — M810 Age-related osteoporosis without current pathological fracture: Secondary | ICD-10-CM | POA: Diagnosis not present

## 2021-05-05 DIAGNOSIS — Z1389 Encounter for screening for other disorder: Secondary | ICD-10-CM | POA: Diagnosis not present

## 2021-05-05 DIAGNOSIS — Z0000 Encounter for general adult medical examination without abnormal findings: Secondary | ICD-10-CM | POA: Diagnosis not present

## 2021-05-05 DIAGNOSIS — I10 Essential (primary) hypertension: Secondary | ICD-10-CM | POA: Diagnosis not present

## 2021-05-05 DIAGNOSIS — E039 Hypothyroidism, unspecified: Secondary | ICD-10-CM | POA: Diagnosis not present

## 2021-05-05 DIAGNOSIS — H3530 Unspecified macular degeneration: Secondary | ICD-10-CM | POA: Diagnosis not present

## 2021-05-08 DIAGNOSIS — M4848XD Fatigue fracture of vertebra, sacral and sacrococcygeal region, subsequent encounter for fracture with routine healing: Secondary | ICD-10-CM | POA: Diagnosis not present

## 2021-05-08 DIAGNOSIS — R2689 Other abnormalities of gait and mobility: Secondary | ICD-10-CM | POA: Diagnosis not present

## 2021-05-10 DIAGNOSIS — M4848XD Fatigue fracture of vertebra, sacral and sacrococcygeal region, subsequent encounter for fracture with routine healing: Secondary | ICD-10-CM | POA: Diagnosis not present

## 2021-05-10 DIAGNOSIS — R2689 Other abnormalities of gait and mobility: Secondary | ICD-10-CM | POA: Diagnosis not present

## 2021-05-12 DIAGNOSIS — R2689 Other abnormalities of gait and mobility: Secondary | ICD-10-CM | POA: Diagnosis not present

## 2021-05-12 DIAGNOSIS — M4848XD Fatigue fracture of vertebra, sacral and sacrococcygeal region, subsequent encounter for fracture with routine healing: Secondary | ICD-10-CM | POA: Diagnosis not present

## 2021-05-16 DIAGNOSIS — R2689 Other abnormalities of gait and mobility: Secondary | ICD-10-CM | POA: Diagnosis not present

## 2021-05-16 DIAGNOSIS — M4848XD Fatigue fracture of vertebra, sacral and sacrococcygeal region, subsequent encounter for fracture with routine healing: Secondary | ICD-10-CM | POA: Diagnosis not present

## 2021-05-17 DIAGNOSIS — R2689 Other abnormalities of gait and mobility: Secondary | ICD-10-CM | POA: Diagnosis not present

## 2021-05-17 DIAGNOSIS — M4848XD Fatigue fracture of vertebra, sacral and sacrococcygeal region, subsequent encounter for fracture with routine healing: Secondary | ICD-10-CM | POA: Diagnosis not present

## 2021-05-19 DIAGNOSIS — R2689 Other abnormalities of gait and mobility: Secondary | ICD-10-CM | POA: Diagnosis not present

## 2021-05-19 DIAGNOSIS — M4848XD Fatigue fracture of vertebra, sacral and sacrococcygeal region, subsequent encounter for fracture with routine healing: Secondary | ICD-10-CM | POA: Diagnosis not present

## 2021-05-22 DIAGNOSIS — M4848XD Fatigue fracture of vertebra, sacral and sacrococcygeal region, subsequent encounter for fracture with routine healing: Secondary | ICD-10-CM | POA: Diagnosis not present

## 2021-05-22 DIAGNOSIS — R2689 Other abnormalities of gait and mobility: Secondary | ICD-10-CM | POA: Diagnosis not present

## 2021-05-24 DIAGNOSIS — R2689 Other abnormalities of gait and mobility: Secondary | ICD-10-CM | POA: Diagnosis not present

## 2021-05-24 DIAGNOSIS — M4848XD Fatigue fracture of vertebra, sacral and sacrococcygeal region, subsequent encounter for fracture with routine healing: Secondary | ICD-10-CM | POA: Diagnosis not present

## 2021-05-25 DIAGNOSIS — E039 Hypothyroidism, unspecified: Secondary | ICD-10-CM | POA: Diagnosis not present

## 2021-05-25 DIAGNOSIS — Z79899 Other long term (current) drug therapy: Secondary | ICD-10-CM | POA: Diagnosis not present

## 2021-05-26 DIAGNOSIS — R2689 Other abnormalities of gait and mobility: Secondary | ICD-10-CM | POA: Diagnosis not present

## 2021-05-26 DIAGNOSIS — M4848XD Fatigue fracture of vertebra, sacral and sacrococcygeal region, subsequent encounter for fracture with routine healing: Secondary | ICD-10-CM | POA: Diagnosis not present

## 2021-05-29 DIAGNOSIS — R2689 Other abnormalities of gait and mobility: Secondary | ICD-10-CM | POA: Diagnosis not present

## 2021-05-29 DIAGNOSIS — M4848XD Fatigue fracture of vertebra, sacral and sacrococcygeal region, subsequent encounter for fracture with routine healing: Secondary | ICD-10-CM | POA: Diagnosis not present

## 2021-05-31 DIAGNOSIS — M4848XD Fatigue fracture of vertebra, sacral and sacrococcygeal region, subsequent encounter for fracture with routine healing: Secondary | ICD-10-CM | POA: Diagnosis not present

## 2021-05-31 DIAGNOSIS — R2689 Other abnormalities of gait and mobility: Secondary | ICD-10-CM | POA: Diagnosis not present

## 2021-05-31 NOTE — Telephone Encounter (Signed)
Prior auth re-faxed via [email or web address] ? ? ? ? ? ? ?

## 2021-06-02 DIAGNOSIS — R2689 Other abnormalities of gait and mobility: Secondary | ICD-10-CM | POA: Diagnosis not present

## 2021-06-02 DIAGNOSIS — M4848XD Fatigue fracture of vertebra, sacral and sacrococcygeal region, subsequent encounter for fracture with routine healing: Secondary | ICD-10-CM | POA: Diagnosis not present

## 2021-06-02 NOTE — Telephone Encounter (Signed)
Received fax from [facility] stating patient doesn't have a plan with active coverage. ?

## 2021-06-05 DIAGNOSIS — M4848XD Fatigue fracture of vertebra, sacral and sacrococcygeal region, subsequent encounter for fracture with routine healing: Secondary | ICD-10-CM | POA: Diagnosis not present

## 2021-06-05 DIAGNOSIS — R2689 Other abnormalities of gait and mobility: Secondary | ICD-10-CM | POA: Diagnosis not present

## 2021-06-07 DIAGNOSIS — M4848XD Fatigue fracture of vertebra, sacral and sacrococcygeal region, subsequent encounter for fracture with routine healing: Secondary | ICD-10-CM | POA: Diagnosis not present

## 2021-06-07 DIAGNOSIS — R2689 Other abnormalities of gait and mobility: Secondary | ICD-10-CM | POA: Diagnosis not present

## 2021-06-08 NOTE — Telephone Encounter (Signed)
Please reach out to pt to [employer] information.  ? ?Thanks! ?

## 2021-06-09 DIAGNOSIS — R2689 Other abnormalities of gait and mobility: Secondary | ICD-10-CM | POA: Diagnosis not present

## 2021-06-09 DIAGNOSIS — M4848XD Fatigue fracture of vertebra, sacral and sacrococcygeal region, subsequent encounter for fracture with routine healing: Secondary | ICD-10-CM | POA: Diagnosis not present

## 2021-06-13 DIAGNOSIS — H53413 Scotoma involving central area, bilateral: Secondary | ICD-10-CM | POA: Diagnosis not present

## 2021-06-15 DIAGNOSIS — M4848XD Fatigue fracture of vertebra, sacral and sacrococcygeal region, subsequent encounter for fracture with routine healing: Secondary | ICD-10-CM | POA: Diagnosis not present

## 2021-06-15 DIAGNOSIS — R2689 Other abnormalities of gait and mobility: Secondary | ICD-10-CM | POA: Diagnosis not present

## 2021-06-15 NOTE — Telephone Encounter (Signed)
Coverage active per [employer] ? ? ?

## 2021-06-19 DIAGNOSIS — M4848XD Fatigue fracture of vertebra, sacral and sacrococcygeal region, subsequent encounter for fracture with routine healing: Secondary | ICD-10-CM | POA: Diagnosis not present

## 2021-06-19 DIAGNOSIS — R2689 Other abnormalities of gait and mobility: Secondary | ICD-10-CM | POA: Diagnosis not present

## 2021-06-19 DIAGNOSIS — M545 Low back pain, unspecified: Secondary | ICD-10-CM | POA: Diagnosis not present

## 2021-06-20 ENCOUNTER — Ambulatory Visit: Payer: Medicare Other | Admitting: Endocrinology

## 2021-06-21 DIAGNOSIS — M4848XD Fatigue fracture of vertebra, sacral and sacrococcygeal region, subsequent encounter for fracture with routine healing: Secondary | ICD-10-CM | POA: Diagnosis not present

## 2021-06-21 DIAGNOSIS — R2689 Other abnormalities of gait and mobility: Secondary | ICD-10-CM | POA: Diagnosis not present

## 2021-06-22 ENCOUNTER — Ambulatory Visit: Payer: Medicare Other | Admitting: Endocrinology

## 2021-06-26 DIAGNOSIS — R2689 Other abnormalities of gait and mobility: Secondary | ICD-10-CM | POA: Diagnosis not present

## 2021-06-26 DIAGNOSIS — M4848XD Fatigue fracture of vertebra, sacral and sacrococcygeal region, subsequent encounter for fracture with routine healing: Secondary | ICD-10-CM | POA: Diagnosis not present

## 2021-06-27 DIAGNOSIS — R2689 Other abnormalities of gait and mobility: Secondary | ICD-10-CM | POA: Diagnosis not present

## 2021-06-27 DIAGNOSIS — M4848XD Fatigue fracture of vertebra, sacral and sacrococcygeal region, subsequent encounter for fracture with routine healing: Secondary | ICD-10-CM | POA: Diagnosis not present

## 2021-06-28 DIAGNOSIS — R2689 Other abnormalities of gait and mobility: Secondary | ICD-10-CM | POA: Diagnosis not present

## 2021-06-28 DIAGNOSIS — M4848XD Fatigue fracture of vertebra, sacral and sacrococcygeal region, subsequent encounter for fracture with routine healing: Secondary | ICD-10-CM | POA: Diagnosis not present

## 2021-06-29 DIAGNOSIS — H53413 Scotoma involving central area, bilateral: Secondary | ICD-10-CM | POA: Diagnosis not present

## 2021-07-03 ENCOUNTER — Other Ambulatory Visit: Payer: Self-pay

## 2021-07-03 ENCOUNTER — Telehealth: Payer: Self-pay

## 2021-07-03 DIAGNOSIS — M818 Other osteoporosis without current pathological fracture: Secondary | ICD-10-CM

## 2021-07-03 NOTE — Telephone Encounter (Signed)
Patient was tranferred to triage voice mail from front desk. Patient inquiring about scheduling BD test and also mentioned "a question about a referral that Dr. Quincy Simmonds arranged for me". ? ?She is not due for BD test until 12/01/21. Her last was 12/01/19 and was recommended to repeat in 2 years. Order placed. ? ?I called patient and left message (# she left in voicemail was home #) on her ans machine to call. ?

## 2021-07-04 ENCOUNTER — Ambulatory Visit: Payer: Medicare Other | Admitting: Endocrinology

## 2021-07-05 DIAGNOSIS — I872 Venous insufficiency (chronic) (peripheral): Secondary | ICD-10-CM | POA: Diagnosis not present

## 2021-07-05 DIAGNOSIS — D485 Neoplasm of uncertain behavior of skin: Secondary | ICD-10-CM | POA: Diagnosis not present

## 2021-07-05 DIAGNOSIS — D692 Other nonthrombocytopenic purpura: Secondary | ICD-10-CM | POA: Diagnosis not present

## 2021-07-05 DIAGNOSIS — L821 Other seborrheic keratosis: Secondary | ICD-10-CM | POA: Diagnosis not present

## 2021-07-05 DIAGNOSIS — I8312 Varicose veins of left lower extremity with inflammation: Secondary | ICD-10-CM | POA: Diagnosis not present

## 2021-07-05 DIAGNOSIS — I8311 Varicose veins of right lower extremity with inflammation: Secondary | ICD-10-CM | POA: Diagnosis not present

## 2021-07-05 DIAGNOSIS — D1801 Hemangioma of skin and subcutaneous tissue: Secondary | ICD-10-CM | POA: Diagnosis not present

## 2021-07-05 DIAGNOSIS — L570 Actinic keratosis: Secondary | ICD-10-CM | POA: Diagnosis not present

## 2021-07-05 DIAGNOSIS — D0472 Carcinoma in situ of skin of left lower limb, including hip: Secondary | ICD-10-CM | POA: Diagnosis not present

## 2021-07-05 DIAGNOSIS — D2261 Melanocytic nevi of right upper limb, including shoulder: Secondary | ICD-10-CM | POA: Diagnosis not present

## 2021-07-05 DIAGNOSIS — L814 Other melanin hyperpigmentation: Secondary | ICD-10-CM | POA: Diagnosis not present

## 2021-07-06 DIAGNOSIS — I872 Venous insufficiency (chronic) (peripheral): Secondary | ICD-10-CM | POA: Diagnosis not present

## 2021-07-12 DIAGNOSIS — H53413 Scotoma involving central area, bilateral: Secondary | ICD-10-CM | POA: Diagnosis not present

## 2021-07-12 NOTE — Telephone Encounter (Signed)
Patient never called back. ? ?I called her again and received her voice mail. Per DPR access note on file I left message in voice mail that BD test not due until 12/01/21. There is a recall letter on file for this. ? ?I also left message asking her to call if she still has the question regarding a previous referral as I am not sure what she is referring to. ?

## 2021-07-17 NOTE — Telephone Encounter (Signed)
Patient called back. She said she was confused about her visit with Dr. Ronnie Derby office and lack of communication since her visit regarding bone building medication. She was supposed to have a follow up visit this week but had to rescheduled until 08/01/21.  I reviewed her chart and see where his office has been working on a med prior [place] for her.   I encouraged her to follow up with Dr. Dwyane Dee as it seems they are a ways into her medication PA and she can ask questions at her office visit. She was fine with this and will plan to see him on 08/01/21. ?

## 2021-07-19 ENCOUNTER — Ambulatory Visit: Payer: Medicare Other | Admitting: Endocrinology

## 2021-07-21 DIAGNOSIS — L570 Actinic keratosis: Secondary | ICD-10-CM | POA: Diagnosis not present

## 2021-07-21 DIAGNOSIS — D0472 Carcinoma in situ of skin of left lower limb, including hip: Secondary | ICD-10-CM | POA: Diagnosis not present

## 2021-07-22 NOTE — Telephone Encounter (Signed)
VOB re-submitted.  

## 2021-07-24 DIAGNOSIS — H53413 Scotoma involving central area, bilateral: Secondary | ICD-10-CM | POA: Diagnosis not present

## 2021-08-01 ENCOUNTER — Telehealth: Payer: Self-pay | Admitting: Endocrinology

## 2021-08-01 ENCOUNTER — Ambulatory Visit (INDEPENDENT_AMBULATORY_CARE_PROVIDER_SITE_OTHER): Payer: Medicare Other | Admitting: Endocrinology

## 2021-08-01 ENCOUNTER — Telehealth: Payer: Self-pay

## 2021-08-01 ENCOUNTER — Encounter: Payer: Self-pay | Admitting: Endocrinology

## 2021-08-01 VITALS — BP 140/58 | HR 77 | Ht 59.25 in | Wt 122.0 lb

## 2021-08-01 DIAGNOSIS — M810 Age-related osteoporosis without current pathological fracture: Secondary | ICD-10-CM

## 2021-08-01 NOTE — Telephone Encounter (Signed)
Patient is reluctant to take Evenity because of labile indicating osteonecrosis of jaw.  Please check on coverage for Forteo and Tymlos ?

## 2021-08-01 NOTE — Telephone Encounter (Signed)
Patient called because she had visit with Dr. Dwyane Dee today and had concerns. ? ?She expressed concern because her dentist and her PCP had told her that the jaw necrosis she was experiencing was from Prolia use but Dr. Dwyane Dee disagrees. I did assure her that Dr. Dwyane Dee treats bones and is very much the expert. ? ?She had some concern because she said Dr. Dwyane Dee told her to d/c her Vitamin D.  I see Dr. Ronnie Derby note that she was told to d/c additional Vit D 3 but did not. I looked back at his December note and he mentioned she was taking additional 1000 iu's Vit D 3 and should d/c since Vit D level at 90.  I read that note to patient and told her I thought it was the additional Vit D that he wanted her to stop. ? ?Patient said she left feeling like she did not get very many answers. ? ?She asked if she could be referred just for a second opinion. She would like to see Dr. Delrae Rend who is endocrinologist at [facility].  Her husband saw him 10 years ago.   [place] for referral? ?

## 2021-08-01 NOTE — Patient Instructions (Addendum)
Evenity or Tymlos ? ?Stop Vitamin D 1000  ? ?Please call [phone] to schedule a Bone Density (DexaScan) a the [place] office at [place].  ?

## 2021-08-01 NOTE — Progress Notes (Signed)
Patient ID: Breanna [name], female   DOB: 14-Oct-1930, 88 y.o.   MRN: [id number] ? ?       ? ? ?Referring Physician: Aundria Rud, MD ? ? ?Chief complaint: Follow-up of osteoporosis ? ?History of Present Illness: ? ? ?She has had osteoporosis diagnosed by bone density through her gynecologist in 2015 which showed osteoporosis at the femoral neck.  This was the lowest T score at that time ?Her baseline T score was -2.8   ? ?Not clear what her current height is but by her history she has lost about 1-1/2 inches in height ? ?Her records indicate that she was started on Prolia in 2017 ?This was continued every 6 months until 04/2020 ? ?Her dose of Prolia in 10/2020 was held because of continuing dental issues and recommendation of her dental physicians ?Her dentist Dr. Marily Memos was called today and he confirmed that she had osteonecrosis last year which has now healed ? ?With her Prolia regimen her bone density did not improve with the last LOWEST bone density of the right femoral neck to be  3.2 as of 11/2019.  This compares to the prior bone density at the same site to be -3.0 ? ?Calcium supplements: 600 mg BID ?Vitamin D supplements: Taking total of 2600 units daily including 1000 units of vitamin D3 ?She was told to leave off the extra vitamin D but she is still taking ? ?Her last known fractures were bilateral sacral fractures in 12/22 ? ?LABS: ? ?Lab Results  ?Component Value Date  ? VD25OH 90.52 03/22/2021  ? VD25OH 82.73 11/17/2015  ? ? ? ?Past Medical History:  ?Diagnosis Date  ? Hypertension   ? Hypothyroidism   ? 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 lower right mandible, tooth #30  ? Osteoporosis, senile 10/2017  ? T score -3.0.  Overall stable/improved over prior study on Prolia  ? Vertigo 02/2012  ? Work up at [facility] for stroke-tests negative, no further problem  ? ? ?Past Surgical History:  ?Procedure Laterality Date  ? BACK SURGERY  2006  ? CYSTOCELE REPAIR N/A 08/19/2012  ? Procedure:  ANTERIOR REPAIR (CYSTOCELE);  Surgeon: Reece Packer, MD;  Location: [facility];  Service: Urology;  Laterality: N/A;  ? CYSTOSCOPY N/A 08/19/2012  ? Procedure: CYSTOSCOPY;  Surgeon: Reece Packer, MD;  Location: [facility];  Service: Urology;  Laterality: N/A;  ? EYE SURGERY    ? IR SACROPLASTY BILATERAL  03/15/2021  ? ROBOTIC ASSISTED LAPAROSCOPIC SACROCOLPOPEXY  2016  ? [facility] - Dr. Lurena Nida  ? SALPINGOOPHORECTOMY Bilateral 08/19/2012  ? Procedure: SALPINGO OOPHORECTOMY;  Surgeon: Marvene Staff, MD;  Location: [facility];  Service: Gynecology;  Laterality: Bilateral;  ? TONSILLECTOMY    ? TUBAL LIGATION    ? VAGINAL HYSTERECTOMY N/A 08/19/2012  ? Procedure: HYSTERECTOMY VAGINAL;  Surgeon: Marvene Staff, MD;  Location: [facility];  Service: Gynecology;  Laterality: N/A;  ? VAGINAL PROLAPSE REPAIR N/A 08/19/2012  ? Procedure: VAULT PROLAPSE WITH GRAFT;  Surgeon: Reece Packer, MD;  Location: [facility];  Service: Urology;  Laterality: N/A;  ? ? ?Family History  ?Problem Relation Age of Onset  ? Stroke Father   ? Cancer - Colon Mother   ? ? ?Social History:  reports that she has never smoked. She has never used smokeless tobacco. She reports current alcohol use of about 3.0 standard drinks per week. She reports that she does not use drugs. ? ?Allergies:  ?Allergies  ?  Allergen Reactions  ? Anesthetics, Amide Nausea And Vomiting  ?  Also causes constipation.   ? Percocet [Oxycodone-Acetaminophen] Nausea And Vomiting  ? Sulfa Antibiotics Nausea And Vomiting  ? ? ?Allergies as of 08/01/2021   ? ?   Reactions  ? Anesthetics, Amide Nausea And Vomiting  ? Also causes constipation.   ? Percocet [oxycodone-acetaminophen] Nausea And Vomiting  ? Sulfa Antibiotics Nausea And Vomiting  ? ?  ? ?  ?Medication List  ?  ? ?  ? Accurate as of Aug 01, 2021 11:32 AM. If you have any questions, ask your nurse or doctor.  ?  ?  ? ?  ? ?acetaminophen 500 MG tablet ?Commonly known as: TYLENOL ?Take 1,000 mg by mouth every 6 (six) hours as  needed for mild pain. ?  ?Biotin 5000 MCG Tabs ?Take by mouth. ?  ?CALTRATE 600 PLUS-VIT D PO ?Take 1 tablet by mouth 2 (two) times daily. ?  ?chlorhexidine 0.12 % solution ?Commonly known as: PERIDEX ?SMARTSIG:By Mouth ?  ?Vitamin D-3 5000 UNIT/ML Liqd ?1 tablet ?  ?Cholecalciferol 25 MCG (1000 UT) tablet ?Take 1,000 Units by mouth daily. ?  ?clobetasol cream 0.05 % ?Commonly known as: TEMOVATE ?Apply 1 application topically 2 (two) times daily as needed. ?  ?estradiol 0.1 MG/GM vaginal cream ?Commonly known as: ESTRACE ?Use a peas size the the vaginal opening and urethra two times a week. ?  ?felodipine 10 MG 24 hr tablet ?Commonly known as: PLENDIL ?Take 5 mg by mouth daily. ?  ?finasteride 5 MG tablet ?Commonly known as: PROSCAR ?Take 2.5 mg by mouth daily. ?  ?levothyroxine 100 MCG tablet ?Commonly known as: SYNTHROID ?Take 1 tablet (100 mcg total) by mouth daily. ?  ?lisinopril 10 MG tablet ?Commonly known as: ZESTRIL ?Take by mouth. ?  ?[place] ?Take 1 tablet by mouth daily. ?  ?nystatin-triamcinolone ointment ?Commonly known as: MYCOLOG ?Apply 1 application topically 2 (two) times daily. ?  ?polyethylene glycol 17 g packet ?Commonly known as: MIRALAX / GLYCOLAX ?Take 17 g by mouth daily. ?  ?SYSTANE OP ?Apply 2 drops to eye daily as needed (for dry eyes.). ?  ? ?  ? ? ? ?Review of Systems ? ?Hypothyroidism: Her TSH was high in 12/22 and she is not taking 100 mcg instead of 88 mcg ?TSH done by PCP in 2/22 showed normal TSH of 1.5 ? ?Lab Results  ?Component Value Date  ? TSH 7.71 (H) 03/22/2021  ? FREET4 2.01 (H) 03/22/2021  ? ? ? ?LABS: ? ?No visits with results within 1 Week(s) from this visit.  ?Latest known visit with results is:  ?Office Visit on 03/22/2021  ?Component Date Value Ref Range Status  ? Total Bilirubin 03/22/2021 0.6  0.2 - 1.2 mg/dL Final  ? Bilirubin, Direct 03/22/2021 0.1  0.0 - 0.3 mg/dL Final  ? Alkaline Phosphatase 03/22/2021 147 (H)  39 - 117 U/L Final  ? AST 03/22/2021  27  0 - 37 U/L Final  ? ALT 03/22/2021 17  0 - 35 U/L Final  ? Total Protein 03/22/2021 6.7  6.0 - 8.3 g/dL Final  ? Albumin 03/22/2021 3.9  3.5 - 5.2 g/dL Final  ? Free T4 03/22/2021 2.01 (H)  0.60 - 1.60 ng/dL Final  ? Comment: Specimens from patients who are undergoing biotin therapy and /or ingesting biotin supplements may contain high levels of biotin.  The higher biotin concentration in these specimens interferes with this Free T4 assay.  Specimens that  contain high levels  ?of biotin may cause false high results for this Free T4 assay.  Please interpret results in light of the total clinical presentation of the patient.  ?  ? TSH 03/22/2021 7.71 (H)  0.35 - 5.50 uIU/mL Final  ? VITD 03/22/2021 90.52  30.00 - 100.00 ng/mL Final  ? ? ? ?PHYSICAL EXAM: ? ?BP (!) 140/58   Pulse 77   Ht 4' 11.25" (1.505 m)   Wt 122 lb (55.3 kg)   SpO2 98%   BMI 24.43 kg/m?  ? ? ? ?ASSESSMENT:  ? ?OSTEOPOROSIS, postmenopausal ? ?Her bone density has been historically low at the femoral neck ?She was started on Prolia treatment in 2017 but despite continued treatment her bone density was lower in 2021 compared to 2019 with T score of -3.2 ?Also has sustained  sacral fractures ?Prolia has been stopped because of osteonecrosis of the jaw ?  ?HYPOTHYROIDISM taking 100 mcg levothyroxine with normal TSH on last measurement ? ? ?PLAN:  ? ?Check bone density ?Since patient is reluctant to try Evenity even get prior authorization done for Forteo or Tymlos ?She can leave off the additional vitamin D3 that she is taking separately as her last level was 90 ? ? ?Breanna [name] ?08/01/2021, 11:32 AM   ? ?

## 2021-08-02 NOTE — Telephone Encounter (Signed)
Breanna Bruins, MD  You 13 hours ago (7:33 PM)  ? ?Yes, agree with second opinion to make sure she is at ease with her care.   ? ?

## 2021-08-02 NOTE — Telephone Encounter (Signed)
Dr. Quincy Simmonds, I just realized I routed this to Dr. Dellis Filbert in error. Redirecting original message to you. ?

## 2021-08-02 NOTE — Telephone Encounter (Signed)
[place] for referral to Dr. Buddy Duty for osteoporosis.  ?Thank you. ?

## 2021-08-02 NOTE — Telephone Encounter (Signed)
Left voice mail for Dr. Cindra Eves "secretary" and asked her to call me to make sure Dr. Buddy Duty is taking new patients and protocol for new patient referral. ?

## 2021-08-03 ENCOUNTER — Other Ambulatory Visit (HOSPITAL_COMMUNITY): Payer: Self-pay

## 2021-08-03 NOTE — Telephone Encounter (Signed)
Left message on voice mail that I Dr. Quincy Simmonds was fine with referral to Dr. Buddy Duty and I am waiting on their office to return my call with referral info/fax, etc. ?

## 2021-08-03 NOTE — Telephone Encounter (Signed)
Both medications required PA. ? ?Preferred medications are Tymlos and Teriparatide pen ? ?Submitted a Prior Authorization request to [employer] for TERIPARATIDE 2.16m via CoverMyMeds. Will update once we receive a response. ? ?Key:Breanna [name]- PA Case ID: [id number]? ?Will submit other PA once we receive determination for Teriparatide ? ? ? ?

## 2021-08-04 ENCOUNTER — Other Ambulatory Visit (HOSPITAL_COMMUNITY): Payer: Self-pay

## 2021-08-04 NOTE — Telephone Encounter (Signed)
Submitted a Prior Authorization request to [employer] for TYMLOS via CoverMyMeds. Will update once we receive a response. ? ? ?Key: [id number] - PA Case ID: [id number]  ?

## 2021-08-04 NOTE — Telephone Encounter (Signed)
Plan denied Tylmos, patient must have trial of Teriparatide. Coverage for Teriparatide is below. ?

## 2021-08-04 NOTE — Telephone Encounter (Addendum)
Received notification from [place] regarding a prior authorization for  Teriparatide pen . Authorization has been APPROVED from 07/04/21 to 08/03/23.  ? ?Authorization # [id number] ?Phone # [phone]  ? ?Copay for 1 month supply is $100.00, patient's copay for 2 month supply is $100.00, Plan will not pay for 3 month supply. ? ? ? ? ?

## 2021-08-07 NOTE — Telephone Encounter (Signed)
Referral faxed

## 2021-08-08 DIAGNOSIS — H53413 Scotoma involving central area, bilateral: Secondary | ICD-10-CM | POA: Diagnosis not present

## 2021-08-08 NOTE — Telephone Encounter (Signed)
Attempted to call patient. No answer. Vm left to call us back ?

## 2021-08-09 DIAGNOSIS — R600 Localized edema: Secondary | ICD-10-CM | POA: Diagnosis not present

## 2021-08-09 DIAGNOSIS — R030 Elevated blood-pressure reading, without diagnosis of hypertension: Secondary | ICD-10-CM | POA: Diagnosis not present

## 2021-08-10 NOTE — Telephone Encounter (Signed)
I spoke with patient and let her know that referral has been faxed to Dr. Buddy Duty and I received fax confirmation of receipt. His office said he reviews the referral request/records and decides if appropriate for him to see patient and if so someone will call patient to schedule. ?

## 2021-08-10 NOTE — Telephone Encounter (Signed)
Pt ready for scheduling on or after 08/10/21 ? ?Out-of-pocket cost due at time of visit: $93 ? ?Primary: Medicare ?[place]ity co-insurance: 20% (approximately $418) ?Admin fee co-insurance: 20% (approximately $25) ? ?Deductible: $226 of $226 met ? ?Secondary: [place] ?Evenity co-insurance: 20% (approximately $88) ?Admin fee co-insurance: 20% (approximately $25) ? ?Deductible: $325 of $325 met ? ?Prior Auth: not required ?PA# ?Valid:  ? ?** This summary of benefits is an estimation of the patient's out-of-pocket cost. Exact cost may vary based on individual plan coverage.  ? ? ?

## 2021-08-10 NOTE — Telephone Encounter (Signed)
Per visit note 08/01/21: ? ?Since patient is reluctant to try Evenity even get prior authorization done for Forteo or Tymlos ? ?Pt archived in [email or web address]. ? ?Please advise if patient and/or provider wish to proceed with Prolia therpay. ? ?

## 2021-08-17 ENCOUNTER — Other Ambulatory Visit: Payer: Self-pay

## 2021-08-17 DIAGNOSIS — M818 Other osteoporosis without current pathological fracture: Secondary | ICD-10-CM

## 2021-08-21 DIAGNOSIS — H903 Sensorineural hearing loss, bilateral: Secondary | ICD-10-CM | POA: Diagnosis not present

## 2021-08-21 DIAGNOSIS — Z822 Family history of deafness and hearing loss: Secondary | ICD-10-CM | POA: Diagnosis not present

## 2021-08-21 DIAGNOSIS — E039 Hypothyroidism, unspecified: Secondary | ICD-10-CM | POA: Diagnosis not present

## 2021-08-24 DIAGNOSIS — Z23 Encounter for immunization: Secondary | ICD-10-CM | POA: Diagnosis not present

## 2021-08-29 DIAGNOSIS — H0102A Squamous blepharitis right eye, upper and lower eyelids: Secondary | ICD-10-CM | POA: Diagnosis not present

## 2021-08-29 DIAGNOSIS — H16223 Keratoconjunctivitis sicca, not specified as Sjogren's, bilateral: Secondary | ICD-10-CM | POA: Diagnosis not present

## 2021-08-29 DIAGNOSIS — H35341 Macular cyst, hole, or pseudohole, right eye: Secondary | ICD-10-CM | POA: Diagnosis not present

## 2021-08-29 DIAGNOSIS — H02834 Dermatochalasis of left upper eyelid: Secondary | ICD-10-CM | POA: Diagnosis not present

## 2021-08-30 DIAGNOSIS — H53413 Scotoma involving central area, bilateral: Secondary | ICD-10-CM | POA: Diagnosis not present

## 2021-09-12 DIAGNOSIS — H35341 Macular cyst, hole, or pseudohole, right eye: Secondary | ICD-10-CM | POA: Diagnosis not present

## 2021-09-12 DIAGNOSIS — H0102A Squamous blepharitis right eye, upper and lower eyelids: Secondary | ICD-10-CM | POA: Diagnosis not present

## 2021-09-12 DIAGNOSIS — H16223 Keratoconjunctivitis sicca, not specified as Sjogren's, bilateral: Secondary | ICD-10-CM | POA: Diagnosis not present

## 2021-09-12 DIAGNOSIS — H353132 Nonexudative age-related macular degeneration, bilateral, intermediate dry stage: Secondary | ICD-10-CM | POA: Diagnosis not present

## 2021-09-18 NOTE — Telephone Encounter (Signed)
I spoke with Dr. Katina Degree office. Patient is scheduled 01/30/22.

## 2021-09-27 DIAGNOSIS — H53413 Scotoma involving central area, bilateral: Secondary | ICD-10-CM | POA: Diagnosis not present

## 2021-10-04 DIAGNOSIS — M4726 Other spondylosis with radiculopathy, lumbar region: Secondary | ICD-10-CM | POA: Diagnosis not present

## 2021-10-04 DIAGNOSIS — M5441 Lumbago with sciatica, right side: Secondary | ICD-10-CM | POA: Diagnosis not present

## 2021-10-06 DIAGNOSIS — M6281 Muscle weakness (generalized): Secondary | ICD-10-CM | POA: Diagnosis not present

## 2021-10-06 DIAGNOSIS — M4726 Other spondylosis with radiculopathy, lumbar region: Secondary | ICD-10-CM | POA: Diagnosis not present

## 2021-10-09 DIAGNOSIS — M4726 Other spondylosis with radiculopathy, lumbar region: Secondary | ICD-10-CM | POA: Diagnosis not present

## 2021-10-09 DIAGNOSIS — M6281 Muscle weakness (generalized): Secondary | ICD-10-CM | POA: Diagnosis not present

## 2021-10-17 DIAGNOSIS — M4726 Other spondylosis with radiculopathy, lumbar region: Secondary | ICD-10-CM | POA: Diagnosis not present

## 2021-10-17 DIAGNOSIS — M6281 Muscle weakness (generalized): Secondary | ICD-10-CM | POA: Diagnosis not present

## 2021-10-20 DIAGNOSIS — M4726 Other spondylosis with radiculopathy, lumbar region: Secondary | ICD-10-CM | POA: Diagnosis not present

## 2021-10-20 DIAGNOSIS — M6281 Muscle weakness (generalized): Secondary | ICD-10-CM | POA: Diagnosis not present

## 2021-10-24 DIAGNOSIS — M6281 Muscle weakness (generalized): Secondary | ICD-10-CM | POA: Diagnosis not present

## 2021-10-24 DIAGNOSIS — M4726 Other spondylosis with radiculopathy, lumbar region: Secondary | ICD-10-CM | POA: Diagnosis not present

## 2021-10-27 DIAGNOSIS — M4726 Other spondylosis with radiculopathy, lumbar region: Secondary | ICD-10-CM | POA: Diagnosis not present

## 2021-10-27 DIAGNOSIS — M6281 Muscle weakness (generalized): Secondary | ICD-10-CM | POA: Diagnosis not present

## 2021-10-30 DIAGNOSIS — M4726 Other spondylosis with radiculopathy, lumbar region: Secondary | ICD-10-CM | POA: Diagnosis not present

## 2021-10-30 DIAGNOSIS — M6281 Muscle weakness (generalized): Secondary | ICD-10-CM | POA: Diagnosis not present

## 2021-10-31 DIAGNOSIS — H353122 Nonexudative age-related macular degeneration, left eye, intermediate dry stage: Secondary | ICD-10-CM | POA: Diagnosis not present

## 2021-10-31 DIAGNOSIS — H43812 Vitreous degeneration, left eye: Secondary | ICD-10-CM | POA: Diagnosis not present

## 2021-10-31 DIAGNOSIS — H353114 Nonexudative age-related macular degeneration, right eye, advanced atrophic with subfoveal involvement: Secondary | ICD-10-CM | POA: Diagnosis not present

## 2021-10-31 DIAGNOSIS — H35371 Puckering of macula, right eye: Secondary | ICD-10-CM | POA: Diagnosis not present

## 2021-11-02 DIAGNOSIS — M79604 Pain in right leg: Secondary | ICD-10-CM | POA: Diagnosis not present

## 2021-11-02 DIAGNOSIS — M6281 Muscle weakness (generalized): Secondary | ICD-10-CM | POA: Diagnosis not present

## 2021-11-06 DIAGNOSIS — M79605 Pain in left leg: Secondary | ICD-10-CM | POA: Diagnosis not present

## 2021-11-07 DIAGNOSIS — M79604 Pain in right leg: Secondary | ICD-10-CM | POA: Diagnosis not present

## 2021-11-07 DIAGNOSIS — M6281 Muscle weakness (generalized): Secondary | ICD-10-CM | POA: Diagnosis not present

## 2021-11-10 DIAGNOSIS — M79604 Pain in right leg: Secondary | ICD-10-CM | POA: Diagnosis not present

## 2021-11-10 DIAGNOSIS — M6281 Muscle weakness (generalized): Secondary | ICD-10-CM | POA: Diagnosis not present

## 2021-11-13 DIAGNOSIS — M6281 Muscle weakness (generalized): Secondary | ICD-10-CM | POA: Diagnosis not present

## 2021-11-13 DIAGNOSIS — M79604 Pain in right leg: Secondary | ICD-10-CM | POA: Diagnosis not present

## 2021-11-16 DIAGNOSIS — M6281 Muscle weakness (generalized): Secondary | ICD-10-CM | POA: Diagnosis not present

## 2021-11-16 DIAGNOSIS — M79604 Pain in right leg: Secondary | ICD-10-CM | POA: Diagnosis not present

## 2021-11-16 NOTE — Progress Notes (Deleted)
88 y.o. G68P4 Widowed Caucasian female here for annual breast and pelvic exam.    ***We follow for osteoporosis.  PCP:  Lajean Manes, MD   No LMP recorded. Patient has had a hysterectomy.           Sexually active: {yes no:314532}  The current method of family planning is status post Tubal/ hysterectomy.    Exercising: {yes no:314532}  {types:19826} Smoker:  no  Health Maintenance: Pap:  2011 normal History of abnormal Pap:  {YES NO:22349} MMG:  ***2021 Rt.Br.cyst:Lt.Br.neg, incomplete Colonoscopy:  *** BMD:  12-01-19  Result :T score of right femoral neck -3.2., T score of left femoral neck -2.6. TDaP:  PCP Gardasil:   no HIV:no Hep C:no Screening Labs:  Hb today: ***, Urine today: ***   reports that she has never smoked. She has never used smokeless tobacco. She reports current alcohol use of about 3.0 standard drinks of alcohol per week. She reports that she does not use drugs.  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  Overall stable/improved over prior study on Prolia   Vertigo 02/2012   Work up at [facility] for stroke-tests negative, no further problem    Past Surgical History:  Procedure Laterality Date   BACK SURGERY  2006   CYSTOCELE REPAIR N/A 08/19/2012   Procedure: ANTERIOR REPAIR (CYSTOCELE);  Surgeon: Reece Packer, MD;  Location: [facility];  Service: Urology;  Laterality: N/A;   CYSTOSCOPY N/A 08/19/2012   Procedure: CYSTOSCOPY;  Surgeon: Reece Packer, MD;  Location: [facility];  Service: Urology;  Laterality: N/A;   EYE SURGERY     IR SACROPLASTY BILATERAL  03/15/2021   ROBOTIC ASSISTED LAPAROSCOPIC SACROCOLPOPEXY  2016   [facility] - Dr. Lurena Nida   SALPINGOOPHORECTOMY Bilateral 08/19/2012   Procedure: SALPINGO OOPHORECTOMY;  Surgeon: Marvene Staff, MD;  Location: [facility];  Service: Gynecology;   Laterality: Bilateral;   TONSILLECTOMY     TUBAL LIGATION     VAGINAL HYSTERECTOMY N/A 08/19/2012   Procedure: HYSTERECTOMY VAGINAL;  Surgeon: Marvene Staff, MD;  Location: [facility];  Service: Gynecology;  Laterality: N/A;   VAGINAL PROLAPSE REPAIR N/A 08/19/2012   Procedure: VAULT PROLAPSE WITH GRAFT;  Surgeon: Reece Packer, MD;  Location: [facility];  Service: Urology;  Laterality: N/A;    Current Outpatient Medications  Medication Sig Dispense Refill   acetaminophen (TYLENOL) 500 MG tablet Take 1,000 mg by mouth every 6 (six) hours as needed for mild pain.     Biotin 5000 MCG TABS Take by mouth.     Calcium-Vitamin D (CALTRATE 600 PLUS-VIT D PO) Take 1 tablet by mouth 2 (two) times daily.     chlorhexidine (PERIDEX) 0.12 % solution SMARTSIG:By Mouth     Cholecalciferol (VITAMIN D-3) 5000 UNIT/ML LIQD 1 tablet     Cholecalciferol 1000 UNITS tablet Take 1,000 Units by mouth daily. (Patient not taking: Reported on 08/01/2021)     clobetasol cream (TEMOVATE) 6.76 % Apply 1 application topically 2 (two) times daily as needed. 30 g 0   estradiol (ESTRACE) 0.1 MG/GM vaginal cream Use a peas size the the vaginal opening and urethra two times a week. 42.5 g 0   felodipine (PLENDIL) 10 MG 24 hr tablet Take 5 mg by mouth daily.      finasteride (  PROSCAR) 5 MG tablet Take 2.5 mg by mouth daily.     levothyroxine (SYNTHROID) 100 MCG tablet Take 1 tablet (100 mcg total) by mouth daily. 90 tablet 3   lisinopril (PRINIVIL,ZESTRIL) 10 MG tablet Take by mouth.     Multiple Vitamins-Minerals ([facility] MACULAR HEALTH) MISC Take 1 tablet by mouth daily.     nystatin-triamcinolone ointment (MYCOLOG) Apply 1 application topically 2 (two) times daily. 30 g 0   Polyethyl Glycol-Propyl Glycol (SYSTANE OP) Apply 2 drops to eye daily as needed (for dry eyes.).     polyethylene glycol (MIRALAX / GLYCOLAX) packet Take 17 g by mouth daily.     No current facility-administered medications for this visit.    Family  History  Problem Relation Age of Onset   Stroke Father    Cancer - Colon Mother     Review of Systems  Exam:   There were no vitals taken for this visit.    General appearance: alert, cooperative and appears stated age Head: normocephalic, without obvious abnormality, atraumatic Neck: no adenopathy, supple, symmetrical, trachea midline and thyroid normal to inspection and palpation Lungs: clear to auscultation bilaterally Breasts: normal appearance, no masses or tenderness, No nipple retraction or dimpling, No nipple discharge or bleeding, No axillary adenopathy Heart: regular rate and rhythm Abdomen: soft, non-tender; no masses, no organomegaly Extremities: extremities normal, atraumatic, no cyanosis or edema Skin: skin color, texture, turgor normal. No rashes or lesions Lymph nodes: cervical, supraclavicular, and axillary nodes normal. Neurologic: grossly normal  Pelvic: External genitalia:  no lesions              No abnormal inguinal nodes palpated.              Urethra:  normal appearing urethra with no masses, tenderness or lesions              Bartholins and Skenes: normal                 Vagina: normal appearing vagina with normal color and discharge, no lesions              Cervix: no lesions              Pap taken: {yes no:314532} Bimanual Exam:  Uterus:  normal size, contour, position, consistency, mobility, non-tender              Adnexa: no mass, fullness, tenderness              Rectal exam: {yes no:314532}.  Confirms.              Anus:  normal sphincter tone, no lesions  Chaperone was present for exam:  ***  Assessment:   Well woman visit with gynecologic exam.   Plan: Mammogram screening discussed. Self breast awareness reviewed. Pap and HR HPV as above. Guidelines for Calcium, Vitamin D, regular exercise program including cardiovascular and weight bearing exercise.   Follow up annually and prn.   Additional counseling given.  {yes [id number]. _______  minutes face to face time of which over 50% was spent in counseling.    After visit summary provided.

## 2021-11-17 DIAGNOSIS — I10 Essential (primary) hypertension: Secondary | ICD-10-CM | POA: Diagnosis not present

## 2021-11-20 DIAGNOSIS — M79604 Pain in right leg: Secondary | ICD-10-CM | POA: Diagnosis not present

## 2021-11-20 DIAGNOSIS — M6281 Muscle weakness (generalized): Secondary | ICD-10-CM | POA: Diagnosis not present

## 2021-11-22 ENCOUNTER — Ambulatory Visit: Payer: Medicare Other | Admitting: Obstetrics and Gynecology

## 2021-11-23 ENCOUNTER — Encounter: Payer: Self-pay | Admitting: Obstetrics and Gynecology

## 2021-11-23 DIAGNOSIS — M6281 Muscle weakness (generalized): Secondary | ICD-10-CM | POA: Diagnosis not present

## 2021-11-23 DIAGNOSIS — M79604 Pain in right leg: Secondary | ICD-10-CM | POA: Diagnosis not present

## 2021-11-23 NOTE — Progress Notes (Deleted)
88 y.o. G7P4 Widowed Caucasian female here for annual exam.    Patient is followed for osteoporosis.  PCP:   Lajean Manes, MD  No LMP recorded. Patient has had a hysterectomy.           Sexually active: {yes no:314532}  The current method of family planning is status post Tubal/hysterectomy.    Exercising: {yes no:314532}  {types:19826} Smoker:  no  Health Maintenance: Pap:  2011 normal History of abnormal Pap:  {YES NO:22349} MMG:  *** Colonoscopy:  ***2007 polyp removed BMD: ***12-01-19  Result :Osteoporosis TDaP:  12-07-14 Gardasil:   no HIV: no Hep C:no Screening Labs:  Hb today: ***, Urine today: ***   reports that she has never smoked. She has never used smokeless tobacco. She reports current alcohol use of about 3.0 standard drinks of alcohol per week. She reports that she does not use drugs.  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  Overall stable/improved over prior study on Prolia   Vertigo 02/2012   Work up at [facility] for stroke-tests negative, no further problem    Past Surgical History:  Procedure Laterality Date   BACK SURGERY  2006   CYSTOCELE REPAIR N/A 08/19/2012   Procedure: ANTERIOR REPAIR (CYSTOCELE);  Surgeon: Reece Packer, MD;  Location: [facility];  Service: Urology;  Laterality: N/A;   CYSTOSCOPY N/A 08/19/2012   Procedure: CYSTOSCOPY;  Surgeon: Reece Packer, MD;  Location: [facility];  Service: Urology;  Laterality: N/A;   EYE SURGERY     IR SACROPLASTY BILATERAL  03/15/2021   ROBOTIC ASSISTED LAPAROSCOPIC SACROCOLPOPEXY  2016   [facility] - Dr. Lurena Nida   SALPINGOOPHORECTOMY Bilateral 08/19/2012   Procedure: SALPINGO OOPHORECTOMY;  Surgeon: Marvene Staff, MD;  Location: [facility];  Service: Gynecology;  Laterality: Bilateral;   TONSILLECTOMY     TUBAL LIGATION     VAGINAL HYSTERECTOMY  N/A 08/19/2012   Procedure: HYSTERECTOMY VAGINAL;  Surgeon: Marvene Staff, MD;  Location: [facility];  Service: Gynecology;  Laterality: N/A;   VAGINAL PROLAPSE REPAIR N/A 08/19/2012   Procedure: VAULT PROLAPSE WITH GRAFT;  Surgeon: Reece Packer, MD;  Location: [facility];  Service: Urology;  Laterality: N/A;    Current Outpatient Medications  Medication Sig Dispense Refill   acetaminophen (TYLENOL) 500 MG tablet Take 1,000 mg by mouth every 6 (six) hours as needed for mild pain.     Biotin 5000 MCG TABS Take by mouth.     Calcium-Vitamin D (CALTRATE 600 PLUS-VIT D PO) Take 1 tablet by mouth 2 (two) times daily.     chlorhexidine (PERIDEX) 0.12 % solution SMARTSIG:By Mouth     Cholecalciferol (VITAMIN D-3) 5000 UNIT/ML LIQD 1 tablet     Cholecalciferol 1000 UNITS tablet Take 1,000 Units by mouth daily. (Patient not taking: Reported on 08/01/2021)     clobetasol cream (TEMOVATE) 3.15 % Apply 1 application topically 2 (two) times daily as needed. 30 g 0   estradiol (ESTRACE) 0.1 MG/GM vaginal cream Use a peas size the the vaginal opening and urethra two times a week. 42.5 g 0   felodipine (PLENDIL) 10 MG 24 hr tablet Take 5 mg by mouth daily.      finasteride (PROSCAR) 5 MG tablet Take 2.5 mg by mouth daily.     levothyroxine (SYNTHROID)  100 MCG tablet Take 1 tablet (100 mcg total) by mouth daily. 90 tablet 3   lisinopril (PRINIVIL,ZESTRIL) 10 MG tablet Take by mouth.     Multiple Vitamins-Minerals (MH MACULAR HEALTH) MISC Take 1 tablet by mouth daily.     nystatin-triamcinolone ointment (MYCOLOG) Apply 1 application topically 2 (two) times daily. 30 g 0   Polyethyl Glycol-Propyl Glycol (SYSTANE OP) Apply 2 drops to eye daily as needed (for dry eyes.).     polyethylene glycol (MIRALAX / GLYCOLAX) packet Take 17 g by mouth daily.     No current facility-administered medications for this visit.    Family History  Problem Relation Age of Onset   Stroke Father    Cancer - Colon Mother      Review of Systems  Exam:   There were no vitals taken for this visit.    General appearance: alert, cooperative and appears stated age Head: normocephalic, without obvious abnormality, atraumatic Neck: no adenopathy, supple, symmetrical, trachea midline and thyroid normal to inspection and palpation Lungs: clear to auscultation bilaterally Breasts: normal appearance, no masses or tenderness, No nipple retraction or dimpling, No nipple discharge or bleeding, No axillary adenopathy Heart: regular rate and rhythm Abdomen: soft, non-tender; no masses, no organomegaly Extremities: extremities normal, atraumatic, no cyanosis or edema Skin: skin color, texture, turgor normal. No rashes or lesions Lymph nodes: cervical, supraclavicular, and axillary nodes normal. Neurologic: grossly normal  Pelvic: External genitalia:  no lesions              No abnormal inguinal nodes palpated.              Urethra:  normal appearing urethra with no masses, tenderness or lesions              Bartholins and Skenes: normal                 Vagina: normal appearing vagina with normal color and discharge, no lesions              Cervix: no lesions              Pap taken: {yes no:314532} Bimanual Exam:  Uterus:  normal size, contour, position, consistency, mobility, non-tender              Adnexa: no mass, fullness, tenderness              Rectal exam: {yes no:314532}.  Confirms.              Anus:  normal sphincter tone, no lesions  Chaperone was present for exam:  ***  Assessment:   Well woman visit with gynecologic exam.   Plan: Mammogram screening discussed. Self breast awareness reviewed. Pap and HR HPV as above. Guidelines for Calcium, Vitamin D, regular exercise program including cardiovascular and weight bearing exercise.   Follow up annually and prn.   Additional counseling given.  {yes [id number]. _______ minutes face to face time of which over 50% was spent in counseling.    After  visit summary provided.

## 2021-11-27 DIAGNOSIS — M6281 Muscle weakness (generalized): Secondary | ICD-10-CM | POA: Diagnosis not present

## 2021-11-27 DIAGNOSIS — M79604 Pain in right leg: Secondary | ICD-10-CM | POA: Diagnosis not present

## 2021-11-28 ENCOUNTER — Ambulatory Visit: Payer: Medicare Other | Admitting: Obstetrics and Gynecology

## 2021-11-28 DIAGNOSIS — H04223 Epiphora due to insufficient drainage, bilateral lacrimal glands: Secondary | ICD-10-CM | POA: Diagnosis not present

## 2021-11-28 DIAGNOSIS — Z1231 Encounter for screening mammogram for malignant neoplasm of breast: Secondary | ICD-10-CM | POA: Diagnosis not present

## 2021-11-29 ENCOUNTER — Encounter: Payer: Self-pay | Admitting: Nurse Practitioner

## 2021-11-30 DIAGNOSIS — M6281 Muscle weakness (generalized): Secondary | ICD-10-CM | POA: Diagnosis not present

## 2021-11-30 DIAGNOSIS — M79604 Pain in right leg: Secondary | ICD-10-CM | POA: Diagnosis not present

## 2021-12-04 DIAGNOSIS — M6281 Muscle weakness (generalized): Secondary | ICD-10-CM | POA: Diagnosis not present

## 2021-12-04 DIAGNOSIS — M79604 Pain in right leg: Secondary | ICD-10-CM | POA: Diagnosis not present

## 2021-12-04 DIAGNOSIS — M4726 Other spondylosis with radiculopathy, lumbar region: Secondary | ICD-10-CM | POA: Diagnosis not present

## 2021-12-05 ENCOUNTER — Ambulatory Visit (INDEPENDENT_AMBULATORY_CARE_PROVIDER_SITE_OTHER): Payer: Medicare Other

## 2021-12-05 ENCOUNTER — Other Ambulatory Visit: Payer: Self-pay | Admitting: Obstetrics & Gynecology

## 2021-12-05 ENCOUNTER — Ambulatory Visit: Payer: Medicare Other | Admitting: Endocrinology

## 2021-12-05 DIAGNOSIS — Z13820 Encounter for screening for osteoporosis: Secondary | ICD-10-CM

## 2021-12-05 DIAGNOSIS — M818 Other osteoporosis without current pathological fracture: Secondary | ICD-10-CM

## 2021-12-05 DIAGNOSIS — Z780 Asymptomatic menopausal state: Secondary | ICD-10-CM

## 2021-12-05 DIAGNOSIS — M810 Age-related osteoporosis without current pathological fracture: Secondary | ICD-10-CM

## 2021-12-05 NOTE — Progress Notes (Deleted)
Patient ID: Breanna [name], female   DOB: 07/08/1930, 88 y.o.   MRN: [id number]           Referring Physician: Aundria Rud, MD   Chief complaint: Follow-up of osteoporosis  History of Present Illness:   She has had osteoporosis diagnosed by bone density through her gynecologist in 2015 which showed osteoporosis at the femoral neck.  This was the lowest T score at that time Her baseline T score was -2.8    Not clear what her current height is but by her history she has lost about 1-1/2 inches in height  Her records indicate that she was started on Prolia in 2017 This was continued every 6 months until 04/2020  Her dose of Prolia in 10/2020 was held because of continuing dental issues and recommendation of her dental physicians Her dentist Dr. Marily Memos was called today and he confirmed that she had osteonecrosis last year which has now healed  With her Prolia regimen her bone density did not improve with the last LOWEST bone density of the right femoral neck to be  3.2 as of 11/2019.  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  Overall stable/improved over prior study on Prolia   Vertigo 02/2012   Work up at [facility] for stroke-tests negative, no further problem    Past Surgical History:  Procedure Laterality Date   BACK SURGERY  2006   CYSTOCELE REPAIR N/A 08/19/2012   Procedure:  ANTERIOR REPAIR (CYSTOCELE);  Surgeon: Reece Packer, MD;  Location: [facility];  Service: Urology;  Laterality: N/A;   CYSTOSCOPY N/A 08/19/2012   Procedure: CYSTOSCOPY;  Surgeon: Reece Packer, MD;  Location: [facility];  Service: Urology;  Laterality: N/A;   EYE SURGERY     IR SACROPLASTY BILATERAL  03/15/2021   ROBOTIC ASSISTED LAPAROSCOPIC SACROCOLPOPEXY  2016   [facility] - Dr. Lurena Nida   SALPINGOOPHORECTOMY Bilateral 08/19/2012   Procedure: SALPINGO OOPHORECTOMY;  Surgeon: Marvene Staff, MD;  Location: [facility];  Service: Gynecology;  Laterality: Bilateral;   TONSILLECTOMY     TUBAL LIGATION     VAGINAL HYSTERECTOMY N/A 08/19/2012   Procedure: HYSTERECTOMY VAGINAL;  Surgeon: Marvene Staff, MD;  Location: [facility];  Service: Gynecology;  Laterality: N/A;   VAGINAL PROLAPSE REPAIR N/A 08/19/2012   Procedure: VAULT PROLAPSE WITH GRAFT;  Surgeon: Reece Packer, MD;  Location: [facility];  Service: Urology;  Laterality: N/A;    Family History  Problem Relation Age of Onset   Stroke Father    Cancer - Colon Mother     Social History:  reports that she has never smoked. She has never used smokeless tobacco. She reports current alcohol use of about 3.0 standard drinks of alcohol per week. She reports that she does not use drugs.  Allergies:  Allergies  Allergen Reactions   Anesthetics, Amide Nausea And Vomiting    Also causes constipation.    Percocet [Oxycodone-Acetaminophen] Nausea And Vomiting   Sulfa Antibiotics Nausea And Vomiting    Allergies as of 12/05/2021       Reactions   Anesthetics, Amide Nausea And Vomiting   Also causes constipation.    Percocet [oxycodone-acetaminophen] Nausea And Vomiting   Sulfa Antibiotics Nausea And Vomiting        Medication List        Accurate as of December 05, 2021  8:22 AM. If you have any questions, ask your nurse or doctor.          acetaminophen 500 MG tablet Commonly known as: TYLENOL Take 1,000 mg by mouth every  6 (six) hours as needed for mild pain.   Biotin 5000 MCG Tabs Take by mouth.   CALTRATE 600 PLUS-VIT D PO Take 1 tablet by mouth 2 (two) times daily.   chlorhexidine 0.12 % solution Commonly known as: PERIDEX SMARTSIG:By Mouth   Vitamin D-3 5000 UNIT/ML Liqd 1 tablet   Cholecalciferol 25 MCG (1000 UT) tablet Take 1,000 Units by mouth daily.   clobetasol cream 0.05 % Commonly known as: TEMOVATE Apply 1 application topically 2 (two) times daily as needed.   estradiol 0.1 MG/GM vaginal cream Commonly known as: ESTRACE Use a peas size the the vaginal opening and urethra two times a week.   felodipine 10 MG 24 hr tablet Commonly known as: PLENDIL Take 5 mg by mouth daily.   finasteride 5 MG tablet Commonly known as: PROSCAR Take 2.5 mg by mouth daily.   levothyroxine 100 MCG tablet Commonly known as: SYNTHROID Take 1 tablet (100 mcg total) by mouth daily.   lisinopril 10 MG tablet Commonly known as: ZESTRIL Take by mouth.   [place] Macular Health Misc Take 1 tablet by mouth daily.   nystatin-triamcinolone ointment Commonly known as: MYCOLOG Apply 1 application topically 2 (two) times daily.   polyethylene glycol 17 g packet Commonly known as: MIRALAX / GLYCOLAX Take 17 g by mouth daily.   SYSTANE OP Apply 2 drops to eye daily as needed (for dry eyes.).         Review of Systems  Hypothyroidism: Her TSH was high in 12/22 and she is not taking 100 mcg instead of 88 mcg TSH done by PCP in 2/22 showed normal TSH of 1.5  Lab Results  Component Value Date   TSH 7.71 (H) 03/22/2021   FREET4 2.01 (H) 03/22/2021     LABS:  No visits with results within 1 Week(s) from this visit.  Latest known visit with results is:  Office Visit on 03/22/2021  Component Date Value Ref Range Status   Total Bilirubin 03/22/2021 0.6  0.2 - 1.2 mg/dL Final   Bilirubin, Direct 03/22/2021 0.1  0.0 - 0.3 mg/dL Final   Alkaline Phosphatase 03/22/2021 147 (H)  39 - 117 U/L Final    AST 03/22/2021 27  0 - 37 U/L Final   ALT 03/22/2021 17  0 - 35 U/L Final   Total Protein 03/22/2021 6.7  6.0 - 8.3 g/dL Final   Albumin 03/22/2021 3.9  3.5 - 5.2 g/dL Final   Free T4 03/22/2021 2.01 (H)  0.60 - 1.60 ng/dL Final   Comment: Specimens from patients who are undergoing biotin therapy and /or ingesting biotin supplements may contain high levels of biotin.  The higher biotin concentration in these specimens interferes with this Free T4 assay.  Specimens that contain high levels  of biotin may cause false high results for this Free T4 assay.  Please interpret results in light of the total clinical presentation of the patient.     TSH 03/22/2021 7.71 (H)  0.35 - 5.50 uIU/mL Final   VITD 03/22/2021 90.52  30.00 - 100.00 ng/mL Final     PHYSICAL EXAM:  There were no vitals taken for this visit.    ASSESSMENT:   OSTEOPOROSIS, postmenopausal  Her bone density has been historically low at the femoral neck She was started on Prolia treatment in 2017 but despite continued treatment her bone density was lower in 2021 compared to 2019 with T score of -3.2 Also has sustained  sacral fractures Prolia has been stopped because of osteonecrosis of the jaw   HYPOTHYROIDISM taking 100 mcg levothyroxine with normal TSH on last measurement   PLAN:   Check bone density Since patient is reluctant to try Evenity even get prior authorization done for Forteo or Tymlos She can leave off the additional vitamin D3 that she is taking separately as her last level was [place] 12/05/2021, 8:22 AM

## 2021-12-07 DIAGNOSIS — M6281 Muscle weakness (generalized): Secondary | ICD-10-CM | POA: Diagnosis not present

## 2021-12-07 DIAGNOSIS — M4726 Other spondylosis with radiculopathy, lumbar region: Secondary | ICD-10-CM | POA: Diagnosis not present

## 2021-12-07 DIAGNOSIS — M79604 Pain in right leg: Secondary | ICD-10-CM | POA: Diagnosis not present

## 2021-12-12 DIAGNOSIS — M6281 Muscle weakness (generalized): Secondary | ICD-10-CM | POA: Diagnosis not present

## 2021-12-12 DIAGNOSIS — M79604 Pain in right leg: Secondary | ICD-10-CM | POA: Diagnosis not present

## 2021-12-12 DIAGNOSIS — M4726 Other spondylosis with radiculopathy, lumbar region: Secondary | ICD-10-CM | POA: Diagnosis not present

## 2021-12-13 NOTE — Progress Notes (Unsigned)
88 y.o. G61P4 Widowed Caucasian female here for annual breast and pelvic exam.    She is followed for osteoporosis. Wants to discuss BMD results also. She is also followed by Dr. Dwyane Dee for her osteoporosis.  Prolia discontinued in 2022 due to osteonecrosis, resolved on chart review. Hx bilateral sacral fractures in 12/22. She had a sacroplasty done 03/15/21.  Patient is concerned about doing daily Forteo injections.   Using vaginal estrogen cream to the urethra. No vaginal dryness.   Voiding 2 - 3 times per night.  No caffeine use.  Occasional use of ETOH.   PCP:  Lajean Manes, MD   No LMP recorded. Patient has had a hysterectomy.           Sexually active: No.  The current method of family planning is status post tubal/hysterectomy.    Exercising: Yes.     Home work outs, yoga, tone & balance Smoker:  no  Health Maintenance: Pap:  2011 Normal History of abnormal Pap:  no MMG:  11-28-21 Neg/BiRads2 Colonoscopy:  Years ago--age out at 66 BMD:  12-05-21  Result :Osteoporosis TDaP:  12-07-14 Gardasil:   no HIV: no Hep C: no Screening Labs: PCP.   reports that she has never smoked. She has never used smokeless tobacco. She reports current alcohol use of about 3.0 standard drinks of alcohol per week. She reports that she does not use drugs.  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  Overall stable/improved over prior study on Prolia   Vertigo 02/2012   Work up at [facility] for stroke-tests negative, no further problem    Past Surgical History:  Procedure Laterality Date   BACK SURGERY  2006   CYSTOCELE REPAIR N/A 08/19/2012   Procedure: ANTERIOR REPAIR (CYSTOCELE);  Surgeon: Reece Packer, MD;  Location: [facility];  Service: Urology;  Laterality: N/A;   CYSTOSCOPY N/A 08/19/2012   Procedure: CYSTOSCOPY;  Surgeon:  Reece Packer, MD;  Location: [facility];  Service: Urology;  Laterality: N/A;   EYE SURGERY     IR SACROPLASTY BILATERAL  03/15/2021   ROBOTIC ASSISTED LAPAROSCOPIC SACROCOLPOPEXY  2016   [facility] - Dr. Lurena Nida   SALPINGOOPHORECTOMY Bilateral 08/19/2012   Procedure: SALPINGO OOPHORECTOMY;  Surgeon: Marvene Staff, MD;  Location: [facility];  Service: Gynecology;  Laterality: Bilateral;   TONSILLECTOMY     TUBAL LIGATION     VAGINAL HYSTERECTOMY N/A 08/19/2012   Procedure: HYSTERECTOMY VAGINAL;  Surgeon: Marvene Staff, MD;  Location: [facility];  Service: Gynecology;  Laterality: N/A;   VAGINAL PROLAPSE REPAIR N/A 08/19/2012   Procedure: VAULT PROLAPSE WITH GRAFT;  Surgeon: Reece Packer, MD;  Location: [facility];  Service: Urology;  Laterality: N/A;    Current Outpatient Medications  Medication Sig Dispense Refill   acetaminophen (TYLENOL) 500 MG tablet Take 1,000 mg by mouth every 6 (six) hours as needed for mild pain.     Biotin 5000 MCG TABS Take by mouth.     Calcium-Vitamin D (CALTRATE 600 PLUS-VIT D PO) Take 1 tablet by mouth 2 (two) times daily.     chlorhexidine (PERIDEX) 0.12 % solution SMARTSIG:By Mouth     Cholecalciferol (VITAMIN D-3) 5000 UNIT/ML LIQD 1 tablet     Cholecalciferol 1000 UNITS tablet Take 1,000 Units by mouth daily.  clobetasol cream (TEMOVATE) 2.42 % Apply 1 application topically 2 (two) times daily as needed. 30 g 0   estradiol (ESTRACE) 0.1 MG/GM vaginal cream Use a peas size the the vaginal opening and urethra two times a week. 42.5 g 0   felodipine (PLENDIL) 10 MG 24 hr tablet Take 5 mg by mouth daily.      finasteride (PROSCAR) 5 MG tablet Take 2.5 mg by mouth daily.     levothyroxine (SYNTHROID) 100 MCG tablet Take 1 tablet (100 mcg total) by mouth daily. 90 tablet 3   lisinopril (PRINIVIL,ZESTRIL) 10 MG tablet Take by mouth.     Multiple Vitamins-Minerals ([facility] MACULAR HEALTH) MISC Take 1 tablet by mouth daily.     nystatin-triamcinolone ointment  (MYCOLOG) Apply 1 application topically 2 (two) times daily. 30 g 0   Polyethyl Glycol-Propyl Glycol (SYSTANE OP) Apply 2 drops to eye daily as needed (for dry eyes.).     polyethylene glycol (MIRALAX / GLYCOLAX) packet Take 17 g by mouth daily.     tobramycin-dexamethasone (TOBRADEX) ophthalmic solution SMARTSIG:In Eye(s)     No current facility-administered medications for this visit.    Family History  Problem Relation Age of Onset   Stroke Father    Cancer - Colon Mother     Review of Systems  All other systems reviewed and are negative.   Exam:   BP (!) 120/58   Pulse 68   Ht '4\' 11"'$  (1.499 m)   Wt 119 lb (54 kg)   SpO2 98%   BMI 24.04 kg/m     General appearance: alert, cooperative and appears stated age Head: normocephalic, without obvious abnormality, atraumatic Neck: no adenopathy, supple, symmetrical, trachea midline and thyroid normal to inspection and palpation Lungs: clear to auscultation bilaterally Breasts: bilateral inverted nipples (longstanding per patient), no masses or tenderness, No nipple discharge or bleeding, No axillary adenopathy Heart: regular rate and rhythm Abdomen: soft, non-tender; no masses, no organomegaly Extremities: extremities normal, atraumatic, no cyanosis or edema Skin: skin color, texture, turgor normal. No rashes or lesions Lymph nodes: cervical, supraclavicular, and axillary nodes normal. Neurologic: grossly normal  Pelvic: External genitalia:  urethral prolapse.               No abnormal inguinal nodes palpated.              Urethra:  normal appearing urethra with no masses, tenderness or lesions              Bartholins and Skenes: normal                 Vagina: normal appearing vagina with normal color and discharge, no lesions.  Good support.              Cervix:  absent              Pap taken: no Bimanual Exam:  Uterus:  absent.  Thin vaginal mucosa.  Underlying suture and graft palpable posteriorly.              Adnexa: no  mass, fullness, tenderness              Rectal exam: yes.  Confirms.              Anus:  normal sphincter tone, no lesions  Chaperone was present for exam:  Sharee Pimple, RN  Assessment:   Well woman visit with gynecologic exam. Status post TVH/BSO, ant colporrhaphy with vaginal vault suspension with graft.  Status post  robotic sacrocolpopexy.  Vaginal atrophy. Urethral prolapse. Bilateral inverted nipples.  Osteoporosis.  Hx sacral fractures and sacroplasty.   Plan: Mammogram screening discussed. Self breast awareness reviewed. Pap and HR HPV as above. Guidelines for Calcium, Vitamin D, regular exercise program including cardiovascular and weight bearing exercise. I recommend using 1/2 gram pf vaginal estrogen cream per vagina and a pea seiz amount tot he urethra at bedtime 2 - 3 times per week to treat vaginal atrophy.  I discussed potential effect on breast cancer.  We reviewed her bone density study in detail and that she is at risk of future fractures. I do recommend treatment for her in order to reduce future risk of fractures.   Fall risk reduction reviewed.  She will follow up with Dr. Dwyane Dee.  Follow up annually and prn.   After visit summary provided.   35 min  total time was spent for this patient encounter, including preparation, face-to-face counseling with the patient, coordination of care, and documentation of the encounter.

## 2021-12-14 ENCOUNTER — Telehealth: Payer: Self-pay

## 2021-12-14 ENCOUNTER — Encounter: Payer: Self-pay | Admitting: Obstetrics and Gynecology

## 2021-12-14 ENCOUNTER — Ambulatory Visit (INDEPENDENT_AMBULATORY_CARE_PROVIDER_SITE_OTHER): Payer: Medicare Other | Admitting: Obstetrics and Gynecology

## 2021-12-14 VITALS — BP 120/58 | HR 68 | Ht 59.0 in | Wt 119.0 lb

## 2021-12-14 DIAGNOSIS — N952 Postmenopausal atrophic vaginitis: Secondary | ICD-10-CM

## 2021-12-14 DIAGNOSIS — Z01419 Encounter for gynecological examination (general) (routine) without abnormal findings: Secondary | ICD-10-CM | POA: Diagnosis not present

## 2021-12-14 DIAGNOSIS — M810 Age-related osteoporosis without current pathological fracture: Secondary | ICD-10-CM | POA: Diagnosis not present

## 2021-12-14 MED ORDER — ESTRADIOL 0.1 MG/GM VA CREA
TOPICAL_CREAM | VAGINAL | 1 refills | Status: DC
Start: 2021-12-14 — End: 2022-09-07

## 2021-12-14 NOTE — Patient Outreach (Signed)
  Care Coordination   Initial Visit Note   12/14/2021 Name: DANNON PERLOW MRN: [id number] DOB: 1930/04/24  MELLISSA CONLEY is a 88 y.o. year old female who sees Charlane Ferretti, MD for primary care. I spoke with  Judeth Cornfield by phone today.  What matters to the patients health and wellness today?  No Concerns Expressed    Goals Addressed             This Visit's Progress    COMPLETED: Care Coordination Activities       Care Coordination Interventions: Reviewed medications and plan for disease management. Reports adhering to plans and attending medical appointments as scheduled. Completed well woman exam earlier today. Reviewed medications. Reports managing well. Denies concerns r/t medication management or prescription cost. Assessed social determinant of health barriers. AWV up to date. Completed on 05/05/21.        SDOH assessments and interventions completed:  Yes  SDOH Interventions Today    Flowsheet Row Most Recent Value  SDOH Interventions   Food Insecurity Interventions Intervention Not Indicated  Transportation Interventions Intervention Not Indicated        Care Coordination Interventions Activated:  Yes  Care Coordination Interventions:  Yes, provided   Follow up plan: No further intervention required.   Encounter Outcome:  Pt. Visit Completed   [facility] [phone]

## 2021-12-14 NOTE — Patient Instructions (Signed)
Visit Information  Thank you for taking time to speak with me today. Please do not hesitate to call if you require additional outreach.  Following are the goals we discussed today:   Goals Addressed             This Visit's Progress    COMPLETED: Care Coordination Activities       Care Coordination Interventions: Reviewed medications and plan for disease management. Reports adhering to plans and attending medical appointments as scheduled. Completed well woman exam earlier today. Reviewed medications. Reports managing well. Denies concerns r/t medication management or prescription cost. Assessed social determinant of health barriers. AWV up to date. Completed on 05/05/21.        Breanna [name] verbalized understanding of the information discussed during the telephonic outreach. Declined need for mailed instructions or resources.  No further intervention required.  [facility] [phone]

## 2021-12-17 NOTE — Patient Instructions (Signed)

## 2021-12-19 DIAGNOSIS — M4726 Other spondylosis with radiculopathy, lumbar region: Secondary | ICD-10-CM | POA: Diagnosis not present

## 2021-12-19 DIAGNOSIS — M79604 Pain in right leg: Secondary | ICD-10-CM | POA: Diagnosis not present

## 2021-12-19 DIAGNOSIS — M6281 Muscle weakness (generalized): Secondary | ICD-10-CM | POA: Diagnosis not present

## 2021-12-25 DIAGNOSIS — M6281 Muscle weakness (generalized): Secondary | ICD-10-CM | POA: Diagnosis not present

## 2021-12-25 DIAGNOSIS — M79604 Pain in right leg: Secondary | ICD-10-CM | POA: Diagnosis not present

## 2021-12-25 DIAGNOSIS — M4726 Other spondylosis with radiculopathy, lumbar region: Secondary | ICD-10-CM | POA: Diagnosis not present

## 2021-12-26 DIAGNOSIS — H16223 Keratoconjunctivitis sicca, not specified as Sjogren's, bilateral: Secondary | ICD-10-CM | POA: Diagnosis not present

## 2021-12-26 DIAGNOSIS — H35341 Macular cyst, hole, or pseudohole, right eye: Secondary | ICD-10-CM | POA: Diagnosis not present

## 2021-12-26 DIAGNOSIS — H04223 Epiphora due to insufficient drainage, bilateral lacrimal glands: Secondary | ICD-10-CM | POA: Diagnosis not present

## 2021-12-26 DIAGNOSIS — H353122 Nonexudative age-related macular degeneration, left eye, intermediate dry stage: Secondary | ICD-10-CM | POA: Diagnosis not present

## 2021-12-26 DIAGNOSIS — H0102A Squamous blepharitis right eye, upper and lower eyelids: Secondary | ICD-10-CM | POA: Diagnosis not present

## 2021-12-29 DIAGNOSIS — M79604 Pain in right leg: Secondary | ICD-10-CM | POA: Diagnosis not present

## 2021-12-29 DIAGNOSIS — M6281 Muscle weakness (generalized): Secondary | ICD-10-CM | POA: Diagnosis not present

## 2021-12-29 DIAGNOSIS — M4726 Other spondylosis with radiculopathy, lumbar region: Secondary | ICD-10-CM | POA: Diagnosis not present

## 2021-12-30 DIAGNOSIS — Z23 Encounter for immunization: Secondary | ICD-10-CM | POA: Diagnosis not present

## 2022-01-10 NOTE — Telephone Encounter (Signed)
Patient called states she doesn't think she can give herself injections. She has a upcoming appt on 10.31.23

## 2022-01-11 NOTE — Telephone Encounter (Signed)
Attempted to contact the patient regarding questions/concerns about the injections. LVM for a call back

## 2022-01-18 NOTE — Telephone Encounter (Signed)
Attempted to contact the patient to inform her that [facility] is her other alternative. LVM for a call back

## 2022-01-23 DIAGNOSIS — H02145 Spastic ectropion of left lower eyelid: Secondary | ICD-10-CM | POA: Diagnosis not present

## 2022-01-23 DIAGNOSIS — H16213 Exposure keratoconjunctivitis, bilateral: Secondary | ICD-10-CM | POA: Diagnosis not present

## 2022-01-23 DIAGNOSIS — H019 Unspecified inflammation of eyelid: Secondary | ICD-10-CM | POA: Diagnosis not present

## 2022-01-23 DIAGNOSIS — H16212 Exposure keratoconjunctivitis, left eye: Secondary | ICD-10-CM | POA: Diagnosis not present

## 2022-01-23 DIAGNOSIS — H02135 Senile ectropion of left lower eyelid: Secondary | ICD-10-CM | POA: Diagnosis not present

## 2022-01-23 DIAGNOSIS — H02115 Cicatricial ectropion of left lower eyelid: Secondary | ICD-10-CM | POA: Diagnosis not present

## 2022-01-23 DIAGNOSIS — H04123 Dry eye syndrome of bilateral lacrimal glands: Secondary | ICD-10-CM | POA: Diagnosis not present

## 2022-01-23 DIAGNOSIS — H02112 Cicatricial ectropion of right lower eyelid: Secondary | ICD-10-CM | POA: Diagnosis not present

## 2022-01-23 DIAGNOSIS — H0279 Other degenerative disorders of eyelid and periocular area: Secondary | ICD-10-CM | POA: Diagnosis not present

## 2022-01-23 DIAGNOSIS — H02142 Spastic ectropion of right lower eyelid: Secondary | ICD-10-CM | POA: Diagnosis not present

## 2022-01-23 DIAGNOSIS — H02132 Senile ectropion of right lower eyelid: Secondary | ICD-10-CM | POA: Diagnosis not present

## 2022-01-23 DIAGNOSIS — H16211 Exposure keratoconjunctivitis, right eye: Secondary | ICD-10-CM | POA: Diagnosis not present

## 2022-01-30 ENCOUNTER — Ambulatory Visit (INDEPENDENT_AMBULATORY_CARE_PROVIDER_SITE_OTHER): Payer: Medicare Other | Admitting: Endocrinology

## 2022-01-30 ENCOUNTER — Encounter: Payer: Self-pay | Admitting: Endocrinology

## 2022-01-30 VITALS — BP 138/62 | HR 73 | Ht 59.5 in | Wt 122.0 lb

## 2022-01-30 DIAGNOSIS — E559 Vitamin D deficiency, unspecified: Secondary | ICD-10-CM

## 2022-01-30 DIAGNOSIS — M810 Age-related osteoporosis without current pathological fracture: Secondary | ICD-10-CM

## 2022-01-30 MED ORDER — TERIPARATIDE 620 MCG/2.48ML ~~LOC~~ SOPN: PEN_INJECTOR | SUBCUTANEOUS | 3 refills | Status: DC

## 2022-01-30 NOTE — Progress Notes (Signed)
Patient ID: Breanna [name], female   DOB: Apr 03, 1930, 88 y.o.   MRN: [id number]           Referring Physician: Aundria Rud, MD   Chief complaint: Follow-up of osteoporosis  History of Present Illness:   She has had osteoporosis diagnosed by bone density through her gynecologist in 2015 which showed osteoporosis at the femoral neck.  This was the lowest T score at that time Her baseline T score was -2.8    By  her history she has lost about 1-1/2 inches in height  Her records indicate that she was started on Prolia in 2017 This was continued every 6 months until 04/2020  Her dose of Prolia in 10/2020 was held because of continuing dental issues and recommendation of her dental physicians Her dentist Dr. Marily Memos confirmed that she had osteonecrosis in 2021 which has now healed  With her Prolia regimen her bone density did not improve with the last LOWEST bone density of the right femoral neck to be  3.2 as of 11/2019.  This compares to the prior bone density at the same site to be -3.0 BONE DENSITY currently shows her T score at the right femoral neck to be -2.9  Calcium supplements: 600 mg BID Vitamin D supplements: Taking total of 16 units daily in her multivitamins She was told to leave off the extra vitamin D  Her last known fractures were bilateral sacral fractures in 12/22  She was recommended Forteo on her last visit but she still has not made up her mind and is reluctant to take injections daily at home  LABS:  Lab Results  Component Value Date   VD25OH 90.52 03/22/2021   VD25OH 82.73 11/17/2015     Past Medical History:  Diagnosis Date   Hypertension    Hypothyroidism    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  Overall stable/improved over prior study on Prolia   Vertigo 02/2012   Work up at [facility] for stroke-tests negative, no further problem    Past Surgical  History:  Procedure Laterality Date   BACK SURGERY  2006   CYSTOCELE REPAIR N/A 08/19/2012   Procedure: ANTERIOR REPAIR (CYSTOCELE);  Surgeon: Reece Packer, MD;  Location: [facility];  Service: Urology;  Laterality: N/A;   CYSTOSCOPY N/A 08/19/2012   Procedure: CYSTOSCOPY;  Surgeon: Reece Packer, MD;  Location: [facility];  Service: Urology;  Laterality: N/A;   EYE SURGERY     IR SACROPLASTY BILATERAL  03/15/2021   ROBOTIC ASSISTED LAPAROSCOPIC SACROCOLPOPEXY  2016   [facility] - Dr. Lurena Nida   SALPINGOOPHORECTOMY Bilateral 08/19/2012   Procedure: SALPINGO OOPHORECTOMY;  Surgeon: Marvene Staff, MD;  Location: [facility];  Service: Gynecology;  Laterality: Bilateral;   TONSILLECTOMY     TUBAL LIGATION     VAGINAL HYSTERECTOMY N/A 08/19/2012   Procedure: HYSTERECTOMY VAGINAL;  Surgeon: Marvene Staff, MD;  Location: [facility];  Service: Gynecology;  Laterality: N/A;   VAGINAL PROLAPSE REPAIR N/A 08/19/2012   Procedure: VAULT PROLAPSE WITH GRAFT;  Surgeon: Reece Packer, MD;  Location: [facility];  Service: Urology;  Laterality: N/A;    Family History  Problem Relation Age of Onset   Stroke Father    Cancer - Colon Mother     Social History:  reports that she has never smoked. She has never used smokeless tobacco. She reports  current alcohol use of about 3.0 standard drinks of alcohol per week. She reports that she does not use drugs.  Allergies:  Allergies  Allergen Reactions   Anesthetics, Amide Nausea And Vomiting    Also causes constipation.    Percocet [Oxycodone-Acetaminophen] Nausea And Vomiting   Sulfa Antibiotics Nausea And Vomiting    Allergies as of 01/30/2022       Reactions   Anesthetics, Amide Nausea And Vomiting   Also causes constipation.    Percocet [oxycodone-acetaminophen] Nausea And Vomiting   Sulfa Antibiotics Nausea And Vomiting        Medication List        Accurate as of January 30, 2022  2:26 PM. If you have any questions, ask your nurse or  doctor.          acetaminophen 500 MG tablet Commonly known as: TYLENOL Take 1,000 mg by mouth every 6 (six) hours as needed for mild pain.   Biotin 5000 MCG Tabs Take by mouth.   CALTRATE 600 PLUS-VIT D PO Take 1 tablet by mouth 2 (two) times daily.   chlorhexidine 0.12 % solution Commonly known as: PERIDEX SMARTSIG:By Mouth   Vitamin D-3 5000 UNIT/ML Liqd 1 tablet   Cholecalciferol 25 MCG (1000 UT) tablet Take 1,000 Units by mouth daily.   clobetasol cream 0.05 % Commonly known as: TEMOVATE Apply 1 application topically 2 (two) times daily as needed.   erythromycin ophthalmic ointment SMARTSIG:In Eye(s)   estradiol 0.1 MG/GM vaginal cream Commonly known as: ESTRACE Place 1/2 gram per vagina and a pea size amount to the urethra at bedtime 2 - 3 times per week.   felodipine 10 MG 24 hr tablet Commonly known as: PLENDIL Take 5 mg by mouth daily.   finasteride 5 MG tablet Commonly known as: PROSCAR Take 2.5 mg by mouth daily.   levothyroxine 100 MCG tablet Commonly known as: SYNTHROID Take 1 tablet (100 mcg total) by mouth daily.   lisinopril 10 MG tablet Commonly known as: ZESTRIL Take by mouth.   [place] Macular Health Misc Take 1 tablet by mouth daily.   nystatin-triamcinolone ointment Commonly known as: MYCOLOG Apply 1 application topically 2 (two) times daily.   polyethylene glycol 17 g packet Commonly known as: MIRALAX / GLYCOLAX Take 17 g by mouth daily.   SYSTANE OP Apply 2 drops to eye daily as needed (for dry eyes.).   tobramycin-dexamethasone ophthalmic solution Commonly known as: TOBRADEX SMARTSIG:In Eye(s)         Review of Systems  Hypothyroidism: Her TSH was high in 12/22  At that time her dose was increased to 100 mcg instead of 88 mcg TSH done by PCP in 2/23 was 1.5  Lab Results  Component Value Date   TSH 7.71 (H) 03/22/2021   FREET4 2.01 (H) 03/22/2021     LABS:  No visits with results within 1 Week(s) from this  visit.  Latest known visit with results is:  Office Visit on 03/22/2021  Component Date Value Ref Range Status   Total Bilirubin 03/22/2021 0.6  0.2 - 1.2 mg/dL Final   Bilirubin, Direct 03/22/2021 0.1  0.0 - 0.3 mg/dL Final   Alkaline Phosphatase 03/22/2021 147 (H)  39 - 117 U/L Final   AST 03/22/2021 27  0 - 37 U/L Final   ALT 03/22/2021 17  0 - 35 U/L Final   Total Protein 03/22/2021 6.7  6.0 - 8.3 g/dL Final   Albumin 03/22/2021 3.9  3.5 - 5.2 g/dL Final  Free T4 03/22/2021 2.01 (H)  0.60 - 1.60 ng/dL Final   Comment: Specimens from patients who are undergoing biotin therapy and /or ingesting biotin supplements may contain high levels of biotin.  The higher biotin concentration in these specimens interferes with this Free T4 assay.  Specimens that contain high levels  of biotin may cause false high results for this Free T4 assay.  Please interpret results in light of the total clinical presentation of the patient.     TSH 03/22/2021 7.71 (H)  0.35 - 5.50 uIU/mL Final   VITD 03/22/2021 90.52  30.00 - 100.00 ng/mL Final     PHYSICAL EXAM:  BP 138/62   Pulse 73   Ht 4' 11.5" (1.511 m)   Wt 122 lb (55.3 kg)   SpO2 96%   BMI 24.23 kg/m     ASSESSMENT:   OSTEOPOROSIS, postmenopausal  Her bone density has been historically low at the femoral neck She was on Prolia treatment between 2017 and 04/2020  Prolia was stopped because of osteonecrosis of the jaw Subsequently she did have sacral fractures in 12/22  Her bone density was lower in 2021 although it is minimally improved as of 9/23 at the femoral neck with a T score of -2.9    HYPOTHYROIDISM taking 100 mcg levothyroxine with normal TSH on last measurement   PLAN:   Discussed in detail that she needs to be on any bone building therapy and since most antiresorptive drugs may cause osteonecrosis of the she is try [place], [place] is not approved by her insurance Although she was refusing to consider injections at home  after showing her the injection device in the office she is accepting this now Also she is reluctant to try Evenity which would be done in the office We will repeat her renal function and calcium levels today as well as vitamin D She does have the prescription at home she will come back for training again  Total visit time for evaluation and management, review of all relevant records and counseling = 30 minutes   Roneka Gilpin 01/30/2022, 2:26 PM

## 2022-01-31 LAB — VITAMIN D 25 HYDROXY (VIT D DEFICIENCY, FRACTURES): VITD: 68.52 ng/mL (ref 30.00–100.00)

## 2022-01-31 LAB — BASIC METABOLIC PANEL
BUN: 28 mg/dL — ABNORMAL HIGH (ref 6–23)
CO2: 28 mEq/L (ref 19–32)
Calcium: 9.8 mg/dL (ref 8.4–10.5)
Chloride: 104 mEq/L (ref 96–112)
Creatinine, Ser: 0.82 mg/dL (ref 0.40–1.20)
GFR: 63.43 mL/min (ref >60.00)
Glucose, Bld: 109 mg/dL — ABNORMAL HIGH (ref 70–99)
Potassium: 4.2 mEq/L (ref 3.5–5.1)
Sodium: 138 mEq/L (ref 135–145)

## 2022-02-05 ENCOUNTER — Telehealth: Payer: Self-pay

## 2022-02-05 NOTE — Telephone Encounter (Signed)
Patient called in left message. I called patient and Forteo medication that was sent in is not available at any local pharmacy. If ordered elsewhere then it will be too expensive and she won't be able to afford it. Wants to know if she can try the once a month injection instead.

## 2022-02-08 ENCOUNTER — Other Ambulatory Visit: Payer: Self-pay

## 2022-02-08 ENCOUNTER — Other Ambulatory Visit (HOSPITAL_COMMUNITY): Payer: Self-pay

## 2022-02-08 DIAGNOSIS — M810 Age-related osteoporosis without current pathological fracture: Secondary | ICD-10-CM

## 2022-02-08 MED ORDER — TERIPARATIDE 620 MCG/2.48ML ~~LOC~~ SOPN
20.0000 ug | PEN_INJECTOR | Freq: Every day | SUBCUTANEOUS | 3 refills | Status: DC
  Filled 2022-02-08: qty 2.24, fill #0

## 2022-02-09 DIAGNOSIS — H02132 Senile ectropion of right lower eyelid: Secondary | ICD-10-CM | POA: Diagnosis not present

## 2022-02-09 DIAGNOSIS — H019 Unspecified inflammation of eyelid: Secondary | ICD-10-CM | POA: Diagnosis not present

## 2022-02-09 DIAGNOSIS — H02413 Mechanical ptosis of bilateral eyelids: Secondary | ICD-10-CM | POA: Diagnosis not present

## 2022-02-09 DIAGNOSIS — H04123 Dry eye syndrome of bilateral lacrimal glands: Secondary | ICD-10-CM | POA: Diagnosis not present

## 2022-02-09 DIAGNOSIS — H02135 Senile ectropion of left lower eyelid: Secondary | ICD-10-CM | POA: Diagnosis not present

## 2022-02-13 ENCOUNTER — Other Ambulatory Visit (HOSPITAL_COMMUNITY): Payer: Self-pay

## 2022-02-15 ENCOUNTER — Other Ambulatory Visit (HOSPITAL_COMMUNITY): Payer: Self-pay

## 2022-02-19 ENCOUNTER — Other Ambulatory Visit: Payer: Self-pay | Admitting: Ophthalmology

## 2022-02-19 DIAGNOSIS — H02142 Spastic ectropion of right lower eyelid: Secondary | ICD-10-CM | POA: Diagnosis not present

## 2022-02-19 DIAGNOSIS — H11822 Conjunctivochalasis, left eye: Secondary | ICD-10-CM | POA: Diagnosis not present

## 2022-02-19 DIAGNOSIS — H04332 Acute lacrimal canaliculitis of left lacrimal passage: Secondary | ICD-10-CM | POA: Diagnosis not present

## 2022-02-19 DIAGNOSIS — H04422 Chronic lacrimal canaliculitis of left lacrimal passage: Secondary | ICD-10-CM | POA: Diagnosis not present

## 2022-02-19 DIAGNOSIS — H019 Unspecified inflammation of eyelid: Secondary | ICD-10-CM | POA: Diagnosis not present

## 2022-02-19 DIAGNOSIS — H04562 Stenosis of left lacrimal punctum: Secondary | ICD-10-CM | POA: Diagnosis not present

## 2022-02-19 DIAGNOSIS — H02112 Cicatricial ectropion of right lower eyelid: Secondary | ICD-10-CM | POA: Diagnosis not present

## 2022-02-19 DIAGNOSIS — H02132 Senile ectropion of right lower eyelid: Secondary | ICD-10-CM | POA: Diagnosis not present

## 2022-02-19 DIAGNOSIS — H04123 Dry eye syndrome of bilateral lacrimal glands: Secondary | ICD-10-CM | POA: Diagnosis not present

## 2022-02-19 DIAGNOSIS — H02135 Senile ectropion of left lower eyelid: Secondary | ICD-10-CM | POA: Diagnosis not present

## 2022-02-19 DIAGNOSIS — H02115 Cicatricial ectropion of left lower eyelid: Secondary | ICD-10-CM | POA: Diagnosis not present

## 2022-02-19 DIAGNOSIS — H02145 Spastic ectropion of left lower eyelid: Secondary | ICD-10-CM | POA: Diagnosis not present

## 2022-02-19 DIAGNOSIS — H16212 Exposure keratoconjunctivitis, left eye: Secondary | ICD-10-CM | POA: Diagnosis not present

## 2022-02-19 DIAGNOSIS — H04522 Eversion of left lacrimal punctum: Secondary | ICD-10-CM | POA: Diagnosis not present

## 2022-02-19 DIAGNOSIS — H16211 Exposure keratoconjunctivitis, right eye: Secondary | ICD-10-CM | POA: Diagnosis not present

## 2022-02-19 DIAGNOSIS — H02535 Eyelid retraction left lower eyelid: Secondary | ICD-10-CM | POA: Diagnosis not present

## 2022-02-20 ENCOUNTER — Other Ambulatory Visit (HOSPITAL_COMMUNITY): Payer: Self-pay

## 2022-03-10 ENCOUNTER — Other Ambulatory Visit: Payer: Self-pay | Admitting: Orthopaedic Surgery

## 2022-03-10 DIAGNOSIS — M25559 Pain in unspecified hip: Secondary | ICD-10-CM

## 2022-03-10 DIAGNOSIS — M545 Low back pain, unspecified: Secondary | ICD-10-CM

## 2022-03-14 ENCOUNTER — Other Ambulatory Visit: Payer: Self-pay | Admitting: Endocrinology

## 2022-03-14 DIAGNOSIS — E063 Autoimmune thyroiditis: Secondary | ICD-10-CM

## 2022-04-05 NOTE — Telephone Encounter (Signed)
Patient called in states that  Teriparatide  is not covered through insurance. Evenity is covered however please advise.

## 2022-04-09 ENCOUNTER — Other Ambulatory Visit (HOSPITAL_COMMUNITY): Payer: Self-pay

## 2022-04-10 ENCOUNTER — Telehealth: Payer: Self-pay | Admitting: Pharmacy Technician

## 2022-04-10 ENCOUNTER — Other Ambulatory Visit (HOSPITAL_COMMUNITY): Payer: Self-pay

## 2022-04-10 NOTE — Telephone Encounter (Signed)
Pharmacy Patient Advocate Encounter  Prior Authorization for Breanna [name] has been approved.    PA# PA Case ID: [id number] Effective dates: 04/02/22 through 04/02/23

## 2022-04-10 NOTE — Telephone Encounter (Signed)
Pharmacy Patient Advocate Encounter   Received notification from CMA/Physician that prior authorization for Tymlos is required/requested.  Per Test Claim: $150 copay, pt has met her deductible. (Transition fill)   PA submitted on 04/10/22 to (ins) [employer] via [place] - PA Case ID: [id number] Status is pending

## 2022-04-11 ENCOUNTER — Other Ambulatory Visit: Payer: Self-pay | Admitting: Endocrinology

## 2022-04-11 ENCOUNTER — Other Ambulatory Visit (HOSPITAL_COMMUNITY): Payer: Self-pay

## 2022-04-11 DIAGNOSIS — M810 Age-related osteoporosis without current pathological fracture: Secondary | ICD-10-CM

## 2022-04-11 MED ORDER — TYMLOS 3120 MCG/1.56ML ~~LOC~~ SOPN
PEN_INJECTOR | SUBCUTANEOUS | 5 refills | Status: AC
  Filled 2022-04-11: qty 1.56, fill #0

## 2022-04-11 NOTE — Telephone Encounter (Signed)
I called patient she is not at all happy. She will get medication but she is very nervous. She would like for [facility] to demonstrate.

## 2022-04-12 ENCOUNTER — Ambulatory Visit: Payer: Medicare Other | Admitting: Obstetrics and Gynecology

## 2022-04-13 ENCOUNTER — Other Ambulatory Visit (HOSPITAL_COMMUNITY): Payer: Self-pay

## 2022-04-16 ENCOUNTER — Other Ambulatory Visit: Payer: Self-pay

## 2022-04-18 NOTE — Telephone Encounter (Signed)
Breanna [name] said that I can not see her, unless this is a self pay, because her insurance will not pay for her to see me.

## 2022-04-19 ENCOUNTER — Other Ambulatory Visit (HOSPITAL_COMMUNITY): Payer: Self-pay

## 2022-04-24 ENCOUNTER — Ambulatory Visit (INDEPENDENT_AMBULATORY_CARE_PROVIDER_SITE_OTHER): Payer: Medicare Other | Admitting: Obstetrics and Gynecology

## 2022-04-24 ENCOUNTER — Encounter: Payer: Self-pay | Admitting: Obstetrics and Gynecology

## 2022-04-24 ENCOUNTER — Telehealth: Payer: Self-pay | Admitting: Obstetrics and Gynecology

## 2022-04-24 VITALS — BP 118/64 | HR 90 | Ht 59.0 in | Wt 122.0 lb

## 2022-04-24 DIAGNOSIS — M810 Age-related osteoporosis without current pathological fracture: Secondary | ICD-10-CM | POA: Diagnosis not present

## 2022-04-24 DIAGNOSIS — M818 Other osteoporosis without current pathological fracture: Secondary | ICD-10-CM

## 2022-04-24 NOTE — Progress Notes (Unsigned)
GYNECOLOGY  VISIT   HPI: 89 y.o.   Widowed  Caucasian  female   G9P4 with No LMP recorded. Patient has had a hysterectomy.   here for   needs referral to rheumatologist.  She would like to see Dr. Benjamine Mola  Daughter present for the visit today.   Has seen Dr. Dwyane Dee for osteoporosis.   Patient has macular degeneration and vision deficiency and she does not feel comfortable doing self injections daily for this reason.   Does yoga and tone and balance every day.  Walks every day.   Lives at [facility].   GYNECOLOGIC HISTORY: No LMP recorded. Patient has had a hysterectomy. Contraception:  hysterectomy Menopausal hormone therapy:  estrace cream Last mammogram:  11/29/21 Breast Density Category B, BI-RADS CATEGORY 2 Benign Last pap smear:   2011 normal        OB History     Gravida  4   Para  4   Term      Preterm      AB      Living  4      SAB      IAB      Ectopic      Multiple      Live Births                 Patient Active Problem List   Diagnosis Date Noted   Prolapsed urethral mucosa 09/21/2019   Cervical spinal stenosis 02/18/2012   Cerebrovascular disease 02/18/2012   Vertigo 02/17/2012   Hypertension 02/17/2012   Hypothyroid 02/17/2012   Osteoporosis 02/17/2012    Past Medical History:  Diagnosis Date   Hypertension    Hypothyroidism    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  Overall stable/improved over prior study on Prolia   Vertigo 02/2012   Work up at [facility] for stroke-tests negative, no further problem    Past Surgical History:  Procedure Laterality Date   BACK SURGERY  2006   CYSTOCELE REPAIR N/A 08/19/2012   Procedure: ANTERIOR REPAIR (CYSTOCELE);  Surgeon: Reece Packer, MD;  Location: [facility];  Service: Urology;  Laterality: N/A;   CYSTOSCOPY N/A 08/19/2012   Procedure: CYSTOSCOPY;  Surgeon: Reece Packer, MD;  Location: [facility];   Service: Urology;  Laterality: N/A;   EYE SURGERY     IR SACROPLASTY BILATERAL  03/15/2021   ROBOTIC ASSISTED LAPAROSCOPIC SACROCOLPOPEXY  2016   [facility] - Dr. Lurena Nida   SALPINGOOPHORECTOMY Bilateral 08/19/2012   Procedure: SALPINGO OOPHORECTOMY;  Surgeon: Marvene Staff, MD;  Location: [facility];  Service: Gynecology;  Laterality: Bilateral;   TONSILLECTOMY     TUBAL LIGATION     VAGINAL HYSTERECTOMY N/A 08/19/2012   Procedure: HYSTERECTOMY VAGINAL;  Surgeon: Marvene Staff, MD;  Location: [facility];  Service: Gynecology;  Laterality: N/A;   VAGINAL PROLAPSE REPAIR N/A 08/19/2012   Procedure: VAULT PROLAPSE WITH GRAFT;  Surgeon: Reece Packer, MD;  Location: [facility];  Service: Urology;  Laterality: N/A;    Current Outpatient Medications  Medication Sig Dispense Refill   Abaloparatide (TYMLOS) 3120 MCG/1.56ML SOPN Inject 80 mcg daily 1.56 mL 5   acetaminophen (TYLENOL) 500 MG tablet Take 1,000 mg by mouth every 6 (six) hours as needed for mild pain.     Biotin 5000 MCG TABS Take by mouth.     Calcium-Vitamin D (CALTRATE 600 PLUS-VIT  D PO) Take 1 tablet by mouth 2 (two) times daily.     chlorhexidine (PERIDEX) 0.12 % solution SMARTSIG:By Mouth     Cholecalciferol 1000 UNITS tablet Take 1,000 Units by mouth daily.     clobetasol cream (TEMOVATE) 0.25 % Apply 1 application topically 2 (two) times daily as needed. 30 g 0   estradiol (ESTRACE) 0.1 MG/GM vaginal cream Place 1/2 gram per vagina and a pea size amount to the urethra at bedtime 2 - 3 times per week. 42.5 g 1   felodipine (PLENDIL) 10 MG 24 hr tablet Take 5 mg by mouth daily.      finasteride (PROSCAR) 5 MG tablet Take 2.5 mg by mouth daily.     levothyroxine (SYNTHROID) 100 MCG tablet Take 1 tablet (100 mcg total) by mouth daily. 90 tablet 3   lisinopril (PRINIVIL,ZESTRIL) 10 MG tablet Take by mouth.     Multiple Vitamins-Minerals (MH MACULAR HEALTH) MISC Take 1 tablet by mouth daily.     Polyethyl Glycol-Propyl Glycol  (SYSTANE OP) Apply 2 drops to eye daily as needed (for dry eyes.).     polyethylene glycol (MIRALAX / GLYCOLAX) packet Take 17 g by mouth daily.     tobramycin-dexamethasone (TOBRADEX) ophthalmic solution SMARTSIG:In Eye(s)     erythromycin ophthalmic ointment SMARTSIG:In Eye(s) (Patient not taking: Reported on 04/24/2022)     nystatin-triamcinolone ointment (MYCOLOG) Apply 1 application topically 2 (two) times daily. (Patient not taking: Reported on 04/24/2022) 30 g 0   No current facility-administered medications for this visit.     ALLERGIES: Anesthetics, amide; Percocet [oxycodone-acetaminophen]; and Sulfa antibiotics  Family History  Problem Relation Age of Onset   Stroke Father    Cancer - Colon Mother     Social History   Socioeconomic History   Marital status: Widowed    Spouse name: Not on file   Number of children: Not on file   Years of education: Not on file   Highest education level: Not on file  Occupational History   Not on file  Tobacco Use   Smoking status: Never   Smokeless tobacco: Never  Vaping Use   Vaping Use: Never used  Substance and Sexual Activity   Alcohol use: Yes    Alcohol/week: 3.0 standard drinks of alcohol    Types: 3 Glasses of wine per week    Comment: socially drinks wine   Drug use: No   Sexual activity: Not Currently    Comment: 1st intercourse 23 yo-1 partner  Other Topics Concern   Not on file  Social History Narrative   Not on file   Social Determinants of Health   Financial Resource Strain: Not on file  Food Insecurity: No Food Insecurity (12/14/2021)   Hunger Vital Sign    Worried About Running Out of Food in the Last Year: Never true    Ran Out of Food in the Last Year: Never true  Transportation Needs: No Transportation Needs (12/14/2021)   PRAPARE - Hydrologist (Medical): No    Lack of Transportation (Non-Medical): No  Physical Activity: Not on file  Stress: Not on file  Social  Connections: Not on file  Intimate Partner Violence: Not on file    Review of Systems  All other systems reviewed and are negative.   PHYSICAL EXAMINATION:    BP 118/64 (BP Location: Right Arm, Patient Position: Sitting, Cuff Size: Normal)   Pulse 90   Ht '4\' 11"'$  (1.499 m)   Wt  122 lb (55.3 kg)   SpO2 99%   BMI 24.64 kg/m     General appearance: alert, cooperative and appears stated age Head: Normocephalic, without obvious abnormality, atraumatic Neck: no adenopathy, supple, symmetrical, trachea midline and thyroid normal to inspection and palpation Lungs: clear to auscultation bilaterally Breasts: normal appearance, no masses or tenderness, No nipple retraction or dimpling, No nipple discharge or bleeding, No axillary or supraclavicular adenopathy Heart: regular rate and rhythm Abdomen: soft, non-tender, no masses,  no organomegaly Extremities: extremities normal, atraumatic, no cyanosis or edema Skin: Skin color, texture, turgor normal. No rashes or lesions Lymph nodes: Cervical, supraclavicular, and axillary nodes normal. No abnormal inguinal nodes palpated Neurologic: Grossly normal  Pelvic: External genitalia:  no lesions              Urethra:  normal appearing urethra with no masses, tenderness or lesions              Bartholins and Skenes: normal                 Vagina: normal appearing vagina with normal color and discharge, no lesions              Cervix: no lesions                Bimanual Exam:  Uterus:  normal size, contour, position, consistency, mobility, non-tender              Adnexa: no mass, fullness, tenderness              Rectal exam: {yes no:[id number]}.  Confirms.              Anus:  normal sphincter tone, no lesions  Chaperone was present for exam:  ***  ASSESSMENT     PLAN     An After Visit Summary was printed and given to the patient.  ______ minutes face to face time of which over 50% was spent in counseling.

## 2022-04-24 NOTE — Telephone Encounter (Signed)
Please assist in making a referral for my patient to see rheumatologist, Dr. Demaris Callander, for osteoporosis.   He is an external referral and his [place] location is [address], [address] [place]

## 2022-04-26 NOTE — Telephone Encounter (Signed)
Attempted to call patient. She did not answer. I left voicemail to call office back.

## 2022-05-02 NOTE — Telephone Encounter (Signed)
I spoke with patient and she is still deciding and will contact us once she has made an decision.

## 2022-05-04 ENCOUNTER — Other Ambulatory Visit (HOSPITAL_COMMUNITY): Payer: Self-pay

## 2022-05-08 NOTE — Telephone Encounter (Signed)
External ambulatory referral order placed for Dr. Benjamine Mola.  Will forward to [facility] to call to schedule.

## 2022-06-05 NOTE — Telephone Encounter (Signed)
Appointment with Dr. Benjamine Mola 06/28/22.  Encounter closed.

## 2022-06-27 NOTE — Progress Notes (Signed)
Office Visit Note  Patient: Breanna [name]             Date of Birth: 1931-01-31           MRN: [id number]             PCP: Charlane Ferretti, MD Referring: Aundria Rud* Visit Date: 06/28/2022   Subjective:  New Patient (Initial Visit) (Patient states she needs something to replace prolia. )   History of Present Illness: Breanna [name] is a 89 y.o. female here for evaluation and management of osteoporosis. She was previously on treatment with prolia from 2017-2022 stopped 04/2020 due to developing some type of abnormal growth or healing in the jaw associated with dental procedures though not clear that this was osteonecrosis of the jaw she was recommended to stop antiresorptive therapy. In 03/2021 was evaluated for sacral insufficiency fracture.  She went through a course of physical therapy has not had any subsequent significant falls.  She follows up with Dr. Dwyane Dee for hypothyroidism as well as her osteoporosis stopped antiresorptive therapy due to lack of efficacy in concern related to her dental complications.  Discussed forteo but preferred against daily self injection. Has also discussed evenity in office treatments. She does not have a previous history of fractures and other major joint injury prior to these events.  She currently takes calcium and vitamin D supplementation with 600 mg calcium and 20 mcg vitamin D3 twice daily.  She stays physically active with walking also participates in seated yoga type group exercise programs at her community. She has generalized osteoarthritis of multiple areas does get some knee pain and stiffness.  No problems with lower extremity neuropathy does not experience vertigo.   Imaging reviewed 12/05/21 DXA  Left Femoral Neck  0.551  -2.7 Left Total Hip   0.646  -2.4 Right Femoral Neck  0.528  -2.9 Right Total Hip  0.595  -2.5  12/01/19 Left Femoral Neck  0.562  -2.6 Left Total Hip   0.672  -2.2 Right Femoral Neck  0.499   -3.2 Right Total Hip  0.617  -2.7  11/26/2017 Left Femoral Neck  0.574  -2.5 Left Total Hip   0.655  -2.4 Right Femoral Neck  0.511  -3.0 Right Total Hip  0.608  -2.7  09/14/11 Left Femoral Neck  -2.5 Left Total Hip   -2.4 Right Femoral Neck  -2.5 Right Total Hip  -2.4  Activities of Daily Living:  Patient reports morning stiffness for 10-15 minutes.   Patient Denies nocturnal pain.  Difficulty dressing/grooming: Denies Difficulty climbing stairs: Denies Difficulty getting out of chair: Denies Difficulty using hands for taps, buttons, cutlery, and/or writing: Denies  Review of Systems  Constitutional:  Negative for fatigue.  HENT:  Negative for mouth sores and mouth dryness.   Eyes:  Positive for dryness.  Respiratory:  Negative for shortness of breath.   Cardiovascular:  Negative for chest pain and palpitations.  Gastrointestinal:  Negative for blood in stool, constipation and diarrhea.  Endocrine: Negative for increased urination.  Genitourinary:  Negative for involuntary urination.  Musculoskeletal:  Positive for joint pain, joint pain, myalgias, muscle weakness, morning stiffness and myalgias. Negative for gait problem, joint swelling and muscle tenderness.  Skin:  Positive for color change. Negative for rash, hair loss and sensitivity to sunlight.  Allergic/Immunologic: Negative for susceptible to infections.  Neurological:  Negative for dizziness and headaches.  Hematological:  Negative for swollen glands.  Psychiatric/Behavioral:  Negative for depressed mood  and sleep disturbance. The patient is not nervous/anxious.     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  Overall stable/improved over prior study on Prolia   Vertigo 02/2012   Work up at [facility] for stroke-tests negative, no further problem    Family History  Problem Relation Age of Onset   Cancer - Colon Mother    Stroke Father    Past Surgical History:  Procedure Laterality Date   BACK SURGERY  2006   CYSTOCELE REPAIR N/A 08/19/2012   Procedure: ANTERIOR REPAIR (CYSTOCELE);  Surgeon: Reece Packer, MD;  Location: [facility];  Service: Urology;  Laterality: N/A;   CYSTOSCOPY N/A 08/19/2012   Procedure: CYSTOSCOPY;  Surgeon: Reece Packer, MD;  Location: [facility];  Service: Urology;  Laterality: N/A;   EYE SURGERY     IR SACROPLASTY BILATERAL  03/15/2021   ROBOTIC ASSISTED LAPAROSCOPIC SACROCOLPOPEXY  2016   [facility] - Dr. Lurena Nida   SALPINGOOPHORECTOMY Bilateral 08/19/2012   Procedure: SALPINGO OOPHORECTOMY;  Surgeon: Marvene Staff, MD;  Location: [facility];  Service: Gynecology;  Laterality: Bilateral;   TONSILLECTOMY     TUBAL LIGATION     VAGINAL HYSTERECTOMY N/A 08/19/2012   Procedure: HYSTERECTOMY VAGINAL;  Surgeon: Marvene Staff, MD;  Location: [facility];  Service: Gynecology;  Laterality: N/A;   VAGINAL PROLAPSE REPAIR N/A 08/19/2012   Procedure: VAULT PROLAPSE WITH GRAFT;  Surgeon: Reece Packer, MD;  Location: [facility];  Service: Urology;  Laterality: N/A;   Social History   Social History Narrative   Not on file   Immunization History  Administered Date(s) Administered   Influenza Split 01/19/2010, 12/02/2010, 01/18/2012, 12/29/2012, 01/01/2018, 01/02/2021   Influenza-Unspecified 01/05/2014, 12/19/2015, 01/03/2017, 01/16/2019, 01/19/2020   PFIZER(Purple Top)SARS-COV-2 Vaccination 04/16/2019, 05/07/2019, 01/06/2020   Pneumococcal Conjugate-13 06/28/2014   Pneumococcal Polysaccharide-23 12/31/2004, 01/08/2018   Td  05/15/2004   Tdap 12/07/2014   Zoster, Live 08/21/2011, 07/13/2016, 12/26/2016     Objective: Vital Signs: BP 137/68 (BP Location: Right Arm, Patient Position: Sitting, Cuff Size: Normal)   Pulse 73   Resp 12   Ht 4\' 11"  (1.499 m)   Wt 116 lb (52.6 kg)   BMI 23.43 kg/m    Physical Exam Cardiovascular:     Rate and Rhythm: Normal rate and regular rhythm.  Pulmonary:     Effort: Pulmonary effort is normal.     Breath sounds: Normal breath sounds.  Musculoskeletal:     Right lower leg: No edema.     Left lower leg: No edema.  Skin:    General: Skin is warm and dry.     Findings: No rash.  Neurological:     Mental Status: She is alert.  Psychiatric:        Mood and Affect: Mood normal.      Musculoskeletal Exam:  Shoulders full ROM no tenderness or swelling Elbows full ROM no tenderness or swelling Wrists full ROM no tenderness or swelling Fingers full ROM heberdon's nodes in b/l fingers Knees Slightly limited  flexion ROM, bony widening no effusion   Investigation: No additional findings.  Imaging: No results found.  Recent Labs: Lab Results  Component Value Date   WBC 8.5 03/15/2021   HGB 11.9 (L) 03/15/2021   PLT 355 03/15/2021   NA 138 01/30/2022   K 4.2 01/30/2022   CL 104 01/30/2022   CO2 28 01/30/2022   GLUCOSE 109 (H) 01/30/2022   BUN 28 (H) 01/30/2022   CREATININE 0.82 01/30/2022   BILITOT 0.6 03/22/2021   ALKPHOS 147 (H) 03/22/2021   AST 27 03/22/2021   ALT 17 03/22/2021   PROT 6.7 03/22/2021   ALBUMIN 3.9 03/22/2021   CALCIUM 9.8 01/30/2022   GFRAA 79 11/17/2015    Speciality Comments: No specialty comments available.  Procedures:  No procedures performed Allergies: Anesthetics, amide; Percocet [oxycodone-acetaminophen]; and Sulfa antibiotics   Assessment / Plan:     Visit Diagnoses: Osteoporosis, unspecified osteoporosis type, unspecified pathological fracture presence  Osteoporosis with fragility fracture currently just exercising  and on calcium and vitamin D supplementation.  Reviewed her treatment options unfortunately if there were complications from Prolia with bone healing would expect similar risk from other bisphosphonate agents.  I do not believe Evista would be a good option open not likely to be highly effective and possible risk for blood clot or stroke not good with cerebrovascular disease history. She did not start Forteo or Tymlos which were the previous recommendation from her endocrinologist.  I think these would probably be the safest and most effective medication option available but would have to be able to consistently take the medication which she does not feel like is practical at this time so that is a limitation. Discussed Evenity treatment with in office administration would be doable.  My biggest concern with this is risk versus benefit consideration with known cardiovascular history including previous TIA.  We discussed at some length about this today.  Bone strength is clearly deficient due to sacral fractures but there is not been a very significant progression on review of bone densitometry scans from the past 10 years.  BMD is only slightly below mean age-matched density.  After some discussion including patient and her daughter with shared decision making we will hold off on starting any new medication today agree with getting an updated bone density scan this again in about another 18 months could revisit if there is more significant progression off therapy. In the meantime agree with continuing her current weightbearing exercise and taking calcium and vitamin D supplementation, level was good from 5 months ago at current doses.  Cerebrovascular disease  History of previous TIA no previous completed stroke and no new major cardiovascular event in the past 1 year.  I have some concern about Evenity as a treatment option with known existing cardiovascular risk factor and history although she does not fall  strictly within contraindication of previous event within 1 year.  Orders: No orders of the defined types were placed in this encounter.  No orders of the defined types were placed in this encounter.    Follow-Up Instructions: Return if symptoms worsen or fail to improve.   Collier Salina, MD  Note - This record has been created using [employer].  Chart creation errors have been sought, but may not always  have been located. Such creation errors do not reflect on  the standard of medical care.

## 2022-06-28 ENCOUNTER — Ambulatory Visit: Payer: Medicare PPO | Attending: Internal Medicine | Admitting: Internal Medicine

## 2022-06-28 ENCOUNTER — Encounter: Payer: Self-pay | Admitting: Internal Medicine

## 2022-06-28 VITALS — BP 137/68 | HR 73 | Resp 12 | Ht 59.0 in | Wt 116.0 lb

## 2022-06-28 DIAGNOSIS — I679 Cerebrovascular disease, unspecified: Secondary | ICD-10-CM

## 2022-06-28 DIAGNOSIS — M810 Age-related osteoporosis without current pathological fracture: Secondary | ICD-10-CM

## 2022-07-05 DIAGNOSIS — M170 Bilateral primary osteoarthritis of knee: Secondary | ICD-10-CM | POA: Diagnosis not present

## 2022-07-05 DIAGNOSIS — R2681 Unsteadiness on feet: Secondary | ICD-10-CM | POA: Diagnosis not present

## 2022-07-05 DIAGNOSIS — M25562 Pain in left knee: Secondary | ICD-10-CM | POA: Diagnosis not present

## 2022-07-05 DIAGNOSIS — M25561 Pain in right knee: Secondary | ICD-10-CM | POA: Diagnosis not present

## 2022-07-13 DIAGNOSIS — M25561 Pain in right knee: Secondary | ICD-10-CM | POA: Diagnosis not present

## 2022-07-13 DIAGNOSIS — M170 Bilateral primary osteoarthritis of knee: Secondary | ICD-10-CM | POA: Diagnosis not present

## 2022-07-13 DIAGNOSIS — R2681 Unsteadiness on feet: Secondary | ICD-10-CM | POA: Diagnosis not present

## 2022-07-13 DIAGNOSIS — M25562 Pain in left knee: Secondary | ICD-10-CM | POA: Diagnosis not present

## 2022-07-20 DIAGNOSIS — M25561 Pain in right knee: Secondary | ICD-10-CM | POA: Diagnosis not present

## 2022-07-20 DIAGNOSIS — R2681 Unsteadiness on feet: Secondary | ICD-10-CM | POA: Diagnosis not present

## 2022-07-20 DIAGNOSIS — M170 Bilateral primary osteoarthritis of knee: Secondary | ICD-10-CM | POA: Diagnosis not present

## 2022-07-20 DIAGNOSIS — M25562 Pain in left knee: Secondary | ICD-10-CM | POA: Diagnosis not present

## 2022-07-23 DIAGNOSIS — M170 Bilateral primary osteoarthritis of knee: Secondary | ICD-10-CM | POA: Diagnosis not present

## 2022-07-23 DIAGNOSIS — R2681 Unsteadiness on feet: Secondary | ICD-10-CM | POA: Diagnosis not present

## 2022-07-23 DIAGNOSIS — M25561 Pain in right knee: Secondary | ICD-10-CM | POA: Diagnosis not present

## 2022-07-23 DIAGNOSIS — M25562 Pain in left knee: Secondary | ICD-10-CM | POA: Diagnosis not present

## 2022-07-26 DIAGNOSIS — M25561 Pain in right knee: Secondary | ICD-10-CM | POA: Diagnosis not present

## 2022-07-26 DIAGNOSIS — R2681 Unsteadiness on feet: Secondary | ICD-10-CM | POA: Diagnosis not present

## 2022-07-26 DIAGNOSIS — M170 Bilateral primary osteoarthritis of knee: Secondary | ICD-10-CM | POA: Diagnosis not present

## 2022-07-26 DIAGNOSIS — M25562 Pain in left knee: Secondary | ICD-10-CM | POA: Diagnosis not present

## 2022-07-30 DIAGNOSIS — E039 Hypothyroidism, unspecified: Secondary | ICD-10-CM | POA: Diagnosis not present

## 2022-07-30 DIAGNOSIS — Z5181 Encounter for therapeutic drug level monitoring: Secondary | ICD-10-CM | POA: Diagnosis not present

## 2022-07-30 DIAGNOSIS — D72819 Decreased white blood cell count, unspecified: Secondary | ICD-10-CM | POA: Diagnosis not present

## 2022-07-31 DIAGNOSIS — M170 Bilateral primary osteoarthritis of knee: Secondary | ICD-10-CM | POA: Diagnosis not present

## 2022-07-31 DIAGNOSIS — M25562 Pain in left knee: Secondary | ICD-10-CM | POA: Diagnosis not present

## 2022-07-31 DIAGNOSIS — R2681 Unsteadiness on feet: Secondary | ICD-10-CM | POA: Diagnosis not present

## 2022-07-31 DIAGNOSIS — M25561 Pain in right knee: Secondary | ICD-10-CM | POA: Diagnosis not present

## 2022-08-02 ENCOUNTER — Other Ambulatory Visit: Payer: Medicare Other

## 2022-08-03 DIAGNOSIS — M170 Bilateral primary osteoarthritis of knee: Secondary | ICD-10-CM | POA: Diagnosis not present

## 2022-08-03 DIAGNOSIS — R2681 Unsteadiness on feet: Secondary | ICD-10-CM | POA: Diagnosis not present

## 2022-08-03 DIAGNOSIS — M25562 Pain in left knee: Secondary | ICD-10-CM | POA: Diagnosis not present

## 2022-08-03 DIAGNOSIS — M25561 Pain in right knee: Secondary | ICD-10-CM | POA: Diagnosis not present

## 2022-08-06 ENCOUNTER — Ambulatory Visit: Payer: Medicare Other | Admitting: Endocrinology

## 2022-08-07 DIAGNOSIS — M25562 Pain in left knee: Secondary | ICD-10-CM | POA: Diagnosis not present

## 2022-08-07 DIAGNOSIS — M25561 Pain in right knee: Secondary | ICD-10-CM | POA: Diagnosis not present

## 2022-08-07 DIAGNOSIS — R2681 Unsteadiness on feet: Secondary | ICD-10-CM | POA: Diagnosis not present

## 2022-08-07 DIAGNOSIS — M170 Bilateral primary osteoarthritis of knee: Secondary | ICD-10-CM | POA: Diagnosis not present

## 2022-08-10 DIAGNOSIS — M25562 Pain in left knee: Secondary | ICD-10-CM | POA: Diagnosis not present

## 2022-08-10 DIAGNOSIS — R2681 Unsteadiness on feet: Secondary | ICD-10-CM | POA: Diagnosis not present

## 2022-08-10 DIAGNOSIS — M170 Bilateral primary osteoarthritis of knee: Secondary | ICD-10-CM | POA: Diagnosis not present

## 2022-08-10 DIAGNOSIS — M25561 Pain in right knee: Secondary | ICD-10-CM | POA: Diagnosis not present

## 2022-08-17 DIAGNOSIS — R2681 Unsteadiness on feet: Secondary | ICD-10-CM | POA: Diagnosis not present

## 2022-08-17 DIAGNOSIS — M25561 Pain in right knee: Secondary | ICD-10-CM | POA: Diagnosis not present

## 2022-08-17 DIAGNOSIS — M170 Bilateral primary osteoarthritis of knee: Secondary | ICD-10-CM | POA: Diagnosis not present

## 2022-08-17 DIAGNOSIS — M25562 Pain in left knee: Secondary | ICD-10-CM | POA: Diagnosis not present

## 2022-08-20 DIAGNOSIS — M25562 Pain in left knee: Secondary | ICD-10-CM | POA: Diagnosis not present

## 2022-08-20 DIAGNOSIS — M170 Bilateral primary osteoarthritis of knee: Secondary | ICD-10-CM | POA: Diagnosis not present

## 2022-08-20 DIAGNOSIS — R2681 Unsteadiness on feet: Secondary | ICD-10-CM | POA: Diagnosis not present

## 2022-08-20 DIAGNOSIS — M25561 Pain in right knee: Secondary | ICD-10-CM | POA: Diagnosis not present

## 2022-08-22 DIAGNOSIS — H35341 Macular cyst, hole, or pseudohole, right eye: Secondary | ICD-10-CM | POA: Diagnosis not present

## 2022-08-22 DIAGNOSIS — H353113 Nonexudative age-related macular degeneration, right eye, advanced atrophic without subfoveal involvement: Secondary | ICD-10-CM | POA: Diagnosis not present

## 2022-08-22 DIAGNOSIS — H16223 Keratoconjunctivitis sicca, not specified as Sjogren's, bilateral: Secondary | ICD-10-CM | POA: Diagnosis not present

## 2022-08-22 DIAGNOSIS — H353122 Nonexudative age-related macular degeneration, left eye, intermediate dry stage: Secondary | ICD-10-CM | POA: Diagnosis not present

## 2022-08-22 DIAGNOSIS — H524 Presbyopia: Secondary | ICD-10-CM | POA: Diagnosis not present

## 2022-08-24 DIAGNOSIS — M25562 Pain in left knee: Secondary | ICD-10-CM | POA: Diagnosis not present

## 2022-08-24 DIAGNOSIS — R2681 Unsteadiness on feet: Secondary | ICD-10-CM | POA: Diagnosis not present

## 2022-08-24 DIAGNOSIS — M170 Bilateral primary osteoarthritis of knee: Secondary | ICD-10-CM | POA: Diagnosis not present

## 2022-08-24 DIAGNOSIS — M25561 Pain in right knee: Secondary | ICD-10-CM | POA: Diagnosis not present

## 2022-08-27 DIAGNOSIS — R2681 Unsteadiness on feet: Secondary | ICD-10-CM | POA: Diagnosis not present

## 2022-08-27 DIAGNOSIS — M25561 Pain in right knee: Secondary | ICD-10-CM | POA: Diagnosis not present

## 2022-08-27 DIAGNOSIS — M25562 Pain in left knee: Secondary | ICD-10-CM | POA: Diagnosis not present

## 2022-08-27 DIAGNOSIS — M170 Bilateral primary osteoarthritis of knee: Secondary | ICD-10-CM | POA: Diagnosis not present

## 2022-08-28 DIAGNOSIS — D2262 Melanocytic nevi of left upper limb, including shoulder: Secondary | ICD-10-CM | POA: Diagnosis not present

## 2022-08-28 DIAGNOSIS — B078 Other viral warts: Secondary | ICD-10-CM | POA: Diagnosis not present

## 2022-08-28 DIAGNOSIS — L821 Other seborrheic keratosis: Secondary | ICD-10-CM | POA: Diagnosis not present

## 2022-08-31 DIAGNOSIS — M25561 Pain in right knee: Secondary | ICD-10-CM | POA: Diagnosis not present

## 2022-08-31 DIAGNOSIS — R2681 Unsteadiness on feet: Secondary | ICD-10-CM | POA: Diagnosis not present

## 2022-08-31 DIAGNOSIS — M25562 Pain in left knee: Secondary | ICD-10-CM | POA: Diagnosis not present

## 2022-08-31 DIAGNOSIS — M170 Bilateral primary osteoarthritis of knee: Secondary | ICD-10-CM | POA: Diagnosis not present

## 2022-09-04 DIAGNOSIS — M25561 Pain in right knee: Secondary | ICD-10-CM | POA: Diagnosis not present

## 2022-09-04 DIAGNOSIS — R1319 Other dysphagia: Secondary | ICD-10-CM | POA: Diagnosis not present

## 2022-09-04 DIAGNOSIS — R2681 Unsteadiness on feet: Secondary | ICD-10-CM | POA: Diagnosis not present

## 2022-09-04 DIAGNOSIS — M25562 Pain in left knee: Secondary | ICD-10-CM | POA: Diagnosis not present

## 2022-09-04 DIAGNOSIS — R1314 Dysphagia, pharyngoesophageal phase: Secondary | ICD-10-CM | POA: Diagnosis not present

## 2022-09-04 DIAGNOSIS — M170 Bilateral primary osteoarthritis of knee: Secondary | ICD-10-CM | POA: Diagnosis not present

## 2022-09-06 ENCOUNTER — Other Ambulatory Visit: Payer: Self-pay | Admitting: Obstetrics and Gynecology

## 2022-09-06 DIAGNOSIS — N952 Postmenopausal atrophic vaginitis: Secondary | ICD-10-CM

## 2022-09-06 NOTE — Telephone Encounter (Signed)
Med refill request: estradiol 0.1mg  vaginal cream Last AEX: 12/14/21 Next AEX: 12/17/22 Last MMG (if hormonal med) 11/29/21 Refill authorized: Please Advise?

## 2022-09-12 DIAGNOSIS — R1314 Dysphagia, pharyngoesophageal phase: Secondary | ICD-10-CM | POA: Diagnosis not present

## 2022-09-12 DIAGNOSIS — M25562 Pain in left knee: Secondary | ICD-10-CM | POA: Diagnosis not present

## 2022-09-12 DIAGNOSIS — R1319 Other dysphagia: Secondary | ICD-10-CM | POA: Diagnosis not present

## 2022-09-12 DIAGNOSIS — M25561 Pain in right knee: Secondary | ICD-10-CM | POA: Diagnosis not present

## 2022-09-12 DIAGNOSIS — R2681 Unsteadiness on feet: Secondary | ICD-10-CM | POA: Diagnosis not present

## 2022-09-12 DIAGNOSIS — M170 Bilateral primary osteoarthritis of knee: Secondary | ICD-10-CM | POA: Diagnosis not present

## 2022-09-14 DIAGNOSIS — R1319 Other dysphagia: Secondary | ICD-10-CM | POA: Diagnosis not present

## 2022-09-14 DIAGNOSIS — R1314 Dysphagia, pharyngoesophageal phase: Secondary | ICD-10-CM | POA: Diagnosis not present

## 2022-09-14 DIAGNOSIS — R2681 Unsteadiness on feet: Secondary | ICD-10-CM | POA: Diagnosis not present

## 2022-09-14 DIAGNOSIS — M25562 Pain in left knee: Secondary | ICD-10-CM | POA: Diagnosis not present

## 2022-09-14 DIAGNOSIS — M25561 Pain in right knee: Secondary | ICD-10-CM | POA: Diagnosis not present

## 2022-09-14 DIAGNOSIS — M170 Bilateral primary osteoarthritis of knee: Secondary | ICD-10-CM | POA: Diagnosis not present

## 2022-09-17 DIAGNOSIS — R2681 Unsteadiness on feet: Secondary | ICD-10-CM | POA: Diagnosis not present

## 2022-09-17 DIAGNOSIS — M25562 Pain in left knee: Secondary | ICD-10-CM | POA: Diagnosis not present

## 2022-09-17 DIAGNOSIS — M170 Bilateral primary osteoarthritis of knee: Secondary | ICD-10-CM | POA: Diagnosis not present

## 2022-09-17 DIAGNOSIS — M25561 Pain in right knee: Secondary | ICD-10-CM | POA: Diagnosis not present

## 2022-09-17 DIAGNOSIS — R1319 Other dysphagia: Secondary | ICD-10-CM | POA: Diagnosis not present

## 2022-09-17 DIAGNOSIS — R1314 Dysphagia, pharyngoesophageal phase: Secondary | ICD-10-CM | POA: Diagnosis not present

## 2022-09-21 DIAGNOSIS — R1319 Other dysphagia: Secondary | ICD-10-CM | POA: Diagnosis not present

## 2022-09-21 DIAGNOSIS — M25561 Pain in right knee: Secondary | ICD-10-CM | POA: Diagnosis not present

## 2022-09-21 DIAGNOSIS — M170 Bilateral primary osteoarthritis of knee: Secondary | ICD-10-CM | POA: Diagnosis not present

## 2022-09-21 DIAGNOSIS — R1314 Dysphagia, pharyngoesophageal phase: Secondary | ICD-10-CM | POA: Diagnosis not present

## 2022-09-21 DIAGNOSIS — M25562 Pain in left knee: Secondary | ICD-10-CM | POA: Diagnosis not present

## 2022-09-21 DIAGNOSIS — R2681 Unsteadiness on feet: Secondary | ICD-10-CM | POA: Diagnosis not present

## 2022-09-24 DIAGNOSIS — S41111A Laceration without foreign body of right upper arm, initial encounter: Secondary | ICD-10-CM | POA: Diagnosis not present

## 2022-09-24 DIAGNOSIS — Z9181 History of falling: Secondary | ICD-10-CM | POA: Diagnosis not present

## 2022-09-24 DIAGNOSIS — M79601 Pain in right arm: Secondary | ICD-10-CM | POA: Diagnosis not present

## 2022-09-24 DIAGNOSIS — I10 Essential (primary) hypertension: Secondary | ICD-10-CM | POA: Diagnosis not present

## 2022-09-24 DIAGNOSIS — Z6822 Body mass index (BMI) 22.0-22.9, adult: Secondary | ICD-10-CM | POA: Diagnosis not present

## 2022-09-24 DIAGNOSIS — H9193 Unspecified hearing loss, bilateral: Secondary | ICD-10-CM | POA: Diagnosis not present

## 2022-09-25 DIAGNOSIS — R1319 Other dysphagia: Secondary | ICD-10-CM | POA: Diagnosis not present

## 2022-09-25 DIAGNOSIS — R2681 Unsteadiness on feet: Secondary | ICD-10-CM | POA: Diagnosis not present

## 2022-09-25 DIAGNOSIS — M25561 Pain in right knee: Secondary | ICD-10-CM | POA: Diagnosis not present

## 2022-09-25 DIAGNOSIS — M25562 Pain in left knee: Secondary | ICD-10-CM | POA: Diagnosis not present

## 2022-09-25 DIAGNOSIS — R1314 Dysphagia, pharyngoesophageal phase: Secondary | ICD-10-CM | POA: Diagnosis not present

## 2022-09-25 DIAGNOSIS — M170 Bilateral primary osteoarthritis of knee: Secondary | ICD-10-CM | POA: Diagnosis not present

## 2022-09-26 DIAGNOSIS — Z9181 History of falling: Secondary | ICD-10-CM | POA: Diagnosis not present

## 2022-09-26 DIAGNOSIS — Z6822 Body mass index (BMI) 22.0-22.9, adult: Secondary | ICD-10-CM | POA: Diagnosis not present

## 2022-09-26 DIAGNOSIS — M79601 Pain in right arm: Secondary | ICD-10-CM | POA: Diagnosis not present

## 2022-09-26 DIAGNOSIS — H9193 Unspecified hearing loss, bilateral: Secondary | ICD-10-CM | POA: Diagnosis not present

## 2022-09-26 DIAGNOSIS — I10 Essential (primary) hypertension: Secondary | ICD-10-CM | POA: Diagnosis not present

## 2022-09-26 DIAGNOSIS — S41111A Laceration without foreign body of right upper arm, initial encounter: Secondary | ICD-10-CM | POA: Diagnosis not present

## 2022-10-09 ENCOUNTER — Telehealth: Payer: Self-pay

## 2022-10-09 NOTE — Telephone Encounter (Signed)
Patient is calling complaining for urinary frequency at night. She states that she can have to urinate 4-5x a night. Please advise.

## 2022-10-12 NOTE — Telephone Encounter (Signed)
Pt notified and voiced understanding. States her sxs seem to be better but will call back if return or worsen to schedule appt. Will route to provider for final review and close.

## 2022-10-12 NOTE — Telephone Encounter (Signed)
Patient needs office visit and urinalysis and culture at our office or with her PCP.

## 2022-11-27 DIAGNOSIS — H353122 Nonexudative age-related macular degeneration, left eye, intermediate dry stage: Secondary | ICD-10-CM | POA: Diagnosis not present

## 2022-11-27 DIAGNOSIS — H353114 Nonexudative age-related macular degeneration, right eye, advanced atrophic with subfoveal involvement: Secondary | ICD-10-CM | POA: Diagnosis not present

## 2022-11-27 DIAGNOSIS — H35371 Puckering of macula, right eye: Secondary | ICD-10-CM | POA: Diagnosis not present

## 2022-11-27 DIAGNOSIS — H35033 Hypertensive retinopathy, bilateral: Secondary | ICD-10-CM | POA: Diagnosis not present

## 2022-11-27 DIAGNOSIS — H43812 Vitreous degeneration, left eye: Secondary | ICD-10-CM | POA: Diagnosis not present

## 2022-12-06 DIAGNOSIS — M79651 Pain in right thigh: Secondary | ICD-10-CM | POA: Diagnosis not present

## 2022-12-06 DIAGNOSIS — I10 Essential (primary) hypertension: Secondary | ICD-10-CM | POA: Diagnosis not present

## 2022-12-07 NOTE — Progress Notes (Unsigned)
89 y.o. G79P4 Widowed Caucasian female here for 1 yr med check.    She is receiving a prescription for vaginal estrogen for atrophy.  Using twice a week.   She is followed for osteoporosis by Dr. Dimple Casey.  She is now off of all prescription medication for this.   Has some general questions.  She gets up 2 - 3 times a night to void.  It can be more if she drinks more.  Her daytime frequency is 3 - 4 times per day.  She denies urinary incontinence.   States she is voiding well.   No caffeine use.  She likes lemon water, flavored waters, and does enjoy wine with dinner at times.   Denies urinary tract infections.   Grandson marrying this weekend.  PCP:   Dr. Margaretann Loveless  No LMP recorded. Patient has had a hysterectomy.           Sexually active: No.  The current method of family planning is status post hysterectomy.    Exercising: Yes.     Yoga, balance, walking Smoker:  no  Health Maintenance: Pap:  2011 normal History of abnormal Pap:  no MMG:  scheduled 12/18/22, 11/29/21 Breast Density Cat B, BI-RADS CAT 2 benign Colonoscopy:  04/06/05 BMD:   12/05/21  Result  osteoporosis - now off medication TDaP:  12/07/14 Gardasil:   no HIV: n/a Hep C: n/a Screening Labs:  PCP   reports that she has never smoked. She has been exposed to tobacco smoke. She has never used smokeless tobacco. She reports current alcohol use of about 3.0 standard drinks of alcohol per week. She reports that she does not use drugs.  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  Overall stable/improved over prior study on Prolia   Vertigo 02/2012   Work up at [facility] for stroke-tests negative, no further problem    Past Surgical History:  Procedure Laterality Date   BACK SURGERY  2006   CYSTOCELE REPAIR N/A 08/19/2012   Procedure: ANTERIOR REPAIR (CYSTOCELE);   Surgeon: Martina Sinner, MD;  Location: [facility];  Service: Urology;  Laterality: N/A;   CYSTOSCOPY N/A 08/19/2012   Procedure: CYSTOSCOPY;  Surgeon: Martina Sinner, MD;  Location: [facility];  Service: Urology;  Laterality: N/A;   EYE SURGERY     IR SACROPLASTY BILATERAL  03/15/2021   ROBOTIC ASSISTED LAPAROSCOPIC SACROCOLPOPEXY  2016   [facility] - Dr. Nigel Sloop   SALPINGOOPHORECTOMY Bilateral 08/19/2012   Procedure: SALPINGO OOPHORECTOMY;  Surgeon: Serita Kyle, MD;  Location: [facility];  Service: Gynecology;  Laterality: Bilateral;   TONSILLECTOMY     TUBAL LIGATION     VAGINAL HYSTERECTOMY N/A 08/19/2012   Procedure: HYSTERECTOMY VAGINAL;  Surgeon: Serita Kyle, MD;  Location: [facility];  Service: Gynecology;  Laterality: N/A;   VAGINAL PROLAPSE REPAIR N/A 08/19/2012   Procedure: VAULT PROLAPSE WITH GRAFT;  Surgeon: Martina Sinner, MD;  Location: [facility];  Service: Urology;  Laterality: N/A;    Current Outpatient Medications  Medication Sig Dispense Refill   acetaminophen (TYLENOL) 500 MG tablet Take 1,000 mg by mouth every 6 (six) hours as needed for mild pain.     Biotin 5000 MCG TABS Take by mouth.     Calcium Carb-Cholecalciferol 600-20 MG-MCG CHEW Chew by mouth.  Calcium-Vitamin D (CALTRATE 600 PLUS-VIT D PO) Take 1 tablet by mouth 2 (two) times daily.     chlorhexidine (PERIDEX) 0.12 % solution      Cholecalciferol 1000 UNITS tablet Take 1,000 Units by mouth daily.     clobetasol cream (TEMOVATE) 0.05 % Apply 1 application topically 2 (two) times daily as needed. 30 g 0   erythromycin ophthalmic ointment      felodipine (PLENDIL) 10 MG 24 hr tablet Take 5 mg by mouth daily.      finasteride (PROSCAR) 5 MG tablet Take 2.5 mg by mouth daily.     levothyroxine (SYNTHROID) 100 MCG tablet Take 1 tablet (100 mcg total) by mouth daily. 90 tablet 3   lisinopril (PRINIVIL,ZESTRIL) 10 MG tablet Take by mouth.     Multiple Vitamins-Minerals (MH MACULAR HEALTH) MISC Take 1  tablet by mouth daily.     nystatin-triamcinolone ointment (MYCOLOG) Apply 1 application topically 2 (two) times daily. 30 g 0   Polyethyl Glycol-Propyl Glycol (SYSTANE OP) Apply 2 drops to eye daily as needed (for dry eyes.).     polyethylene glycol (MIRALAX / GLYCOLAX) packet Take 17 g by mouth daily.     tobramycin-dexamethasone (TOBRADEX) ophthalmic solution      Abaloparatide (TYMLOS) 3120 MCG/1.56ML SOPN Inject 80 mcg daily (Patient not taking: Reported on 12/17/2022) 1.56 mL 5   estradiol (ESTRACE) 0.1 MG/GM vaginal cream Place 1/2 gram per vagina and a pea size amount to the urethra at bedtime 2 - 3 times per week. 42.5 g 1   No current facility-administered medications for this visit.    Family History  Problem Relation Age of Onset   Cancer - Colon Mother    Stroke Father     Review of Systems  All other systems reviewed and are negative.   Exam:   BP 122/68 (BP Location: Right Arm, Patient Position: Sitting, Cuff Size: Normal)   Pulse 75   Ht 4' 11.5" (1.511 m)   Wt 121 lb (54.9 kg)   SpO2 98%   BMI 24.03 kg/m     General appearance: alert, cooperative and appears stated age Head: normocephalic, without obvious abnormality, atraumatic Neck: no adenopathy, supple, symmetrical, trachea midline and thyroid normal to inspection and palpation Lungs: clear to auscultation bilaterally Breasts: normal appearance, no masses or tenderness, bilateral nipple inversion, No nipple discharge or bleeding, No axillary adenopathy Heart: regular rate and rhythm Abdomen: soft, non-tender; no masses, no organomegaly Extremities: extremities normal, atraumatic, no cyanosis or edema Skin: skin color, texture, turgor normal. No rashes or lesions Lymph nodes: cervical, supraclavicular, and axillary nodes normal. Neurologic: grossly normal  Pelvic: External genitalia:  no lesions              No abnormal inguinal nodes palpated.              Urethra:  urethral prolapse, tenderness or  lesions              Bartholins and Skenes: normal                 Vagina: normal appearing vagina with normal color and discharge, no lesions              Cervix:  absent              Pap taken: no Bimanual Exam:  Uterus: absent.   Graft palpable but not visible.              Adnexa: no mass,  fullness, tenderness              Rectal exam: yes.  Confirms.              Anus:  normal sphincter tone, no lesions  Chaperone was present for exam:  Silas Flood, CMA  Assessment:     Status post TVH/BSO, ant colporrhaphy with vaginal vault suspension with graft.  Status post robotic sacrocolpopexy.  Vaginal atrophy. Urethral prolapse. Bilateral inverted nipples.  Osteoporosis. Off medication.  Hx sacral fractures and sacroplasty.  Urinary frequency.   Plan: Mammogram screening discussed. Self breast awareness reviewed. Refill vaginal estradiol cream.  I discussed bladder irritants.  We briefly discussed medication to treat overactive bladder.  No Rx given.  Follow up for breast and pelvic exam and recheck in 1 year.   29 min  total time was spent for this patient encounter, including preparation, face-to-face counseling with the patient, coordination of care, and documentation of the encounter.

## 2022-12-10 DIAGNOSIS — S61412A Laceration without foreign body of left hand, initial encounter: Secondary | ICD-10-CM | POA: Diagnosis not present

## 2022-12-10 DIAGNOSIS — Z6822 Body mass index (BMI) 22.0-22.9, adult: Secondary | ICD-10-CM | POA: Diagnosis not present

## 2022-12-10 DIAGNOSIS — I10 Essential (primary) hypertension: Secondary | ICD-10-CM | POA: Diagnosis not present

## 2022-12-10 DIAGNOSIS — H9193 Unspecified hearing loss, bilateral: Secondary | ICD-10-CM | POA: Diagnosis not present

## 2022-12-10 DIAGNOSIS — Z9181 History of falling: Secondary | ICD-10-CM | POA: Diagnosis not present

## 2022-12-12 DIAGNOSIS — Z1231 Encounter for screening mammogram for malignant neoplasm of breast: Secondary | ICD-10-CM | POA: Diagnosis not present

## 2022-12-12 LAB — HM MAMMOGRAPHY

## 2022-12-17 ENCOUNTER — Ambulatory Visit (INDEPENDENT_AMBULATORY_CARE_PROVIDER_SITE_OTHER): Payer: Medicare PPO | Admitting: Obstetrics and Gynecology

## 2022-12-17 ENCOUNTER — Encounter: Payer: Self-pay | Admitting: Obstetrics and Gynecology

## 2022-12-17 VITALS — BP 122/68 | HR 75 | Ht 59.5 in | Wt 121.0 lb

## 2022-12-17 DIAGNOSIS — R350 Frequency of micturition: Secondary | ICD-10-CM

## 2022-12-17 DIAGNOSIS — Z5181 Encounter for therapeutic drug level monitoring: Secondary | ICD-10-CM | POA: Diagnosis not present

## 2022-12-17 DIAGNOSIS — N952 Postmenopausal atrophic vaginitis: Secondary | ICD-10-CM | POA: Diagnosis not present

## 2022-12-17 MED ORDER — ESTRADIOL 0.1 MG/GM VA CREA: TOPICAL_CREAM | VAGINAL | 1 refills | Status: DC

## 2022-12-19 DIAGNOSIS — Z9181 History of falling: Secondary | ICD-10-CM | POA: Diagnosis not present

## 2022-12-19 DIAGNOSIS — I10 Essential (primary) hypertension: Secondary | ICD-10-CM | POA: Diagnosis not present

## 2022-12-19 DIAGNOSIS — H9193 Unspecified hearing loss, bilateral: Secondary | ICD-10-CM | POA: Diagnosis not present

## 2022-12-19 DIAGNOSIS — S61412A Laceration without foreign body of left hand, initial encounter: Secondary | ICD-10-CM | POA: Diagnosis not present

## 2022-12-19 DIAGNOSIS — Z6822 Body mass index (BMI) 22.0-22.9, adult: Secondary | ICD-10-CM | POA: Diagnosis not present

## 2022-12-28 DIAGNOSIS — H9193 Unspecified hearing loss, bilateral: Secondary | ICD-10-CM | POA: Diagnosis not present

## 2022-12-28 DIAGNOSIS — Z6822 Body mass index (BMI) 22.0-22.9, adult: Secondary | ICD-10-CM | POA: Diagnosis not present

## 2022-12-28 DIAGNOSIS — I10 Essential (primary) hypertension: Secondary | ICD-10-CM | POA: Diagnosis not present

## 2022-12-28 DIAGNOSIS — S61412A Laceration without foreign body of left hand, initial encounter: Secondary | ICD-10-CM | POA: Diagnosis not present

## 2022-12-28 DIAGNOSIS — Z9181 History of falling: Secondary | ICD-10-CM | POA: Diagnosis not present

## 2023-01-01 DIAGNOSIS — N6002 Solitary cyst of left breast: Secondary | ICD-10-CM | POA: Diagnosis not present

## 2023-01-01 DIAGNOSIS — R1110 Vomiting, unspecified: Secondary | ICD-10-CM | POA: Diagnosis not present

## 2023-01-01 DIAGNOSIS — R922 Inconclusive mammogram: Secondary | ICD-10-CM | POA: Diagnosis not present

## 2023-01-02 ENCOUNTER — Other Ambulatory Visit (HOSPITAL_COMMUNITY): Payer: Self-pay | Admitting: Otolaryngology

## 2023-01-02 DIAGNOSIS — R1110 Vomiting, unspecified: Secondary | ICD-10-CM

## 2023-01-07 DIAGNOSIS — H353114 Nonexudative age-related macular degeneration, right eye, advanced atrophic with subfoveal involvement: Secondary | ICD-10-CM | POA: Diagnosis not present

## 2023-01-11 ENCOUNTER — Ambulatory Visit (HOSPITAL_COMMUNITY): Admission: RE | Admit: 2023-01-11 | Payer: Medicare PPO | Source: Ambulatory Visit

## 2023-01-11 ENCOUNTER — Encounter (HOSPITAL_COMMUNITY): Payer: Self-pay

## 2023-01-22 ENCOUNTER — Ambulatory Visit: Payer: Self-pay | Admitting: Internal Medicine

## 2023-01-25 ENCOUNTER — Ambulatory Visit (HOSPITAL_COMMUNITY)
Admission: RE | Admit: 2023-01-25 | Discharge: 2023-01-25 | Disposition: A | Discharge status: Home / Self Care | Payer: Medicare PPO | Source: Ambulatory Visit | Attending: Otolaryngology | Admitting: Otolaryngology

## 2023-01-25 DIAGNOSIS — R1110 Vomiting, unspecified: Secondary | ICD-10-CM | POA: Insufficient documentation

## 2023-01-25 DIAGNOSIS — K224 Dyskinesia of esophagus: Secondary | ICD-10-CM | POA: Diagnosis not present

## 2023-01-30 DIAGNOSIS — S40029A Contusion of unspecified upper arm, initial encounter: Secondary | ICD-10-CM | POA: Diagnosis not present

## 2023-01-30 DIAGNOSIS — H9193 Unspecified hearing loss, bilateral: Secondary | ICD-10-CM | POA: Diagnosis not present

## 2023-01-30 DIAGNOSIS — I10 Essential (primary) hypertension: Secondary | ICD-10-CM | POA: Diagnosis not present

## 2023-02-04 DIAGNOSIS — H43812 Vitreous degeneration, left eye: Secondary | ICD-10-CM | POA: Diagnosis not present

## 2023-02-04 DIAGNOSIS — H35033 Hypertensive retinopathy, bilateral: Secondary | ICD-10-CM | POA: Diagnosis not present

## 2023-02-04 DIAGNOSIS — H35371 Puckering of macula, right eye: Secondary | ICD-10-CM | POA: Diagnosis not present

## 2023-02-04 DIAGNOSIS — H353122 Nonexudative age-related macular degeneration, left eye, intermediate dry stage: Secondary | ICD-10-CM | POA: Diagnosis not present

## 2023-02-04 DIAGNOSIS — H353114 Nonexudative age-related macular degeneration, right eye, advanced atrophic with subfoveal involvement: Secondary | ICD-10-CM | POA: Diagnosis not present

## 2023-02-21 DIAGNOSIS — M542 Cervicalgia: Secondary | ICD-10-CM | POA: Diagnosis not present

## 2023-02-21 DIAGNOSIS — M545 Low back pain, unspecified: Secondary | ICD-10-CM | POA: Diagnosis not present

## 2023-03-13 DIAGNOSIS — M419 Scoliosis, unspecified: Secondary | ICD-10-CM | POA: Diagnosis not present

## 2023-03-13 DIAGNOSIS — M545 Low back pain, unspecified: Secondary | ICD-10-CM | POA: Diagnosis not present

## 2023-03-13 DIAGNOSIS — M542 Cervicalgia: Secondary | ICD-10-CM | POA: Diagnosis not present

## 2023-03-19 DIAGNOSIS — M9901 Segmental and somatic dysfunction of cervical region: Secondary | ICD-10-CM | POA: Diagnosis not present

## 2023-03-19 DIAGNOSIS — M9905 Segmental and somatic dysfunction of pelvic region: Secondary | ICD-10-CM | POA: Diagnosis not present

## 2023-03-19 DIAGNOSIS — M542 Cervicalgia: Secondary | ICD-10-CM | POA: Diagnosis not present

## 2023-03-19 DIAGNOSIS — M9904 Segmental and somatic dysfunction of sacral region: Secondary | ICD-10-CM | POA: Diagnosis not present

## 2023-03-21 DIAGNOSIS — M419 Scoliosis, unspecified: Secondary | ICD-10-CM | POA: Diagnosis not present

## 2023-03-21 DIAGNOSIS — M542 Cervicalgia: Secondary | ICD-10-CM | POA: Diagnosis not present

## 2023-03-21 DIAGNOSIS — M545 Low back pain, unspecified: Secondary | ICD-10-CM | POA: Diagnosis not present

## 2023-03-21 DIAGNOSIS — M9901 Segmental and somatic dysfunction of cervical region: Secondary | ICD-10-CM | POA: Diagnosis not present

## 2023-03-21 DIAGNOSIS — M9904 Segmental and somatic dysfunction of sacral region: Secondary | ICD-10-CM | POA: Diagnosis not present

## 2023-03-21 DIAGNOSIS — M9905 Segmental and somatic dysfunction of pelvic region: Secondary | ICD-10-CM | POA: Diagnosis not present

## 2023-04-01 DIAGNOSIS — M545 Low back pain, unspecified: Secondary | ICD-10-CM | POA: Diagnosis not present

## 2023-04-01 DIAGNOSIS — M1612 Unilateral primary osteoarthritis, left hip: Secondary | ICD-10-CM | POA: Diagnosis not present

## 2023-04-01 DIAGNOSIS — H353114 Nonexudative age-related macular degeneration, right eye, advanced atrophic with subfoveal involvement: Secondary | ICD-10-CM | POA: Diagnosis not present

## 2023-04-01 DIAGNOSIS — M25552 Pain in left hip: Secondary | ICD-10-CM | POA: Diagnosis not present

## 2023-04-09 DIAGNOSIS — H903 Sensorineural hearing loss, bilateral: Secondary | ICD-10-CM | POA: Diagnosis not present

## 2023-04-16 DIAGNOSIS — M542 Cervicalgia: Secondary | ICD-10-CM | POA: Diagnosis not present

## 2023-04-23 DIAGNOSIS — M545 Low back pain, unspecified: Secondary | ICD-10-CM | POA: Diagnosis not present

## 2023-04-25 DIAGNOSIS — M9904 Segmental and somatic dysfunction of sacral region: Secondary | ICD-10-CM | POA: Diagnosis not present

## 2023-04-25 DIAGNOSIS — M542 Cervicalgia: Secondary | ICD-10-CM | POA: Diagnosis not present

## 2023-04-25 DIAGNOSIS — M9905 Segmental and somatic dysfunction of pelvic region: Secondary | ICD-10-CM | POA: Diagnosis not present

## 2023-04-25 DIAGNOSIS — M9901 Segmental and somatic dysfunction of cervical region: Secondary | ICD-10-CM | POA: Diagnosis not present

## 2023-04-29 DIAGNOSIS — M5459 Other low back pain: Secondary | ICD-10-CM | POA: Diagnosis not present

## 2023-05-02 DIAGNOSIS — M5459 Other low back pain: Secondary | ICD-10-CM | POA: Diagnosis not present

## 2023-05-06 DIAGNOSIS — M5459 Other low back pain: Secondary | ICD-10-CM | POA: Diagnosis not present

## 2023-05-08 DIAGNOSIS — M5459 Other low back pain: Secondary | ICD-10-CM | POA: Diagnosis not present

## 2023-05-10 DIAGNOSIS — M5459 Other low back pain: Secondary | ICD-10-CM | POA: Diagnosis not present

## 2023-05-15 DIAGNOSIS — M5459 Other low back pain: Secondary | ICD-10-CM | POA: Diagnosis not present

## 2023-05-17 DIAGNOSIS — M5459 Other low back pain: Secondary | ICD-10-CM | POA: Diagnosis not present

## 2023-05-20 DIAGNOSIS — M5459 Other low back pain: Secondary | ICD-10-CM | POA: Diagnosis not present

## 2023-05-22 DIAGNOSIS — M5459 Other low back pain: Secondary | ICD-10-CM | POA: Diagnosis not present

## 2023-05-24 DIAGNOSIS — M5459 Other low back pain: Secondary | ICD-10-CM | POA: Diagnosis not present

## 2023-05-27 DIAGNOSIS — M5459 Other low back pain: Secondary | ICD-10-CM | POA: Diagnosis not present

## 2023-05-29 DIAGNOSIS — M5459 Other low back pain: Secondary | ICD-10-CM | POA: Diagnosis not present

## 2023-05-30 DIAGNOSIS — M542 Cervicalgia: Secondary | ICD-10-CM | POA: Diagnosis not present

## 2023-05-30 DIAGNOSIS — M9905 Segmental and somatic dysfunction of pelvic region: Secondary | ICD-10-CM | POA: Diagnosis not present

## 2023-05-30 DIAGNOSIS — M9904 Segmental and somatic dysfunction of sacral region: Secondary | ICD-10-CM | POA: Diagnosis not present

## 2023-05-30 DIAGNOSIS — M9901 Segmental and somatic dysfunction of cervical region: Secondary | ICD-10-CM | POA: Diagnosis not present

## 2023-05-31 DIAGNOSIS — M545 Low back pain, unspecified: Secondary | ICD-10-CM | POA: Diagnosis not present

## 2023-09-04 ENCOUNTER — Other Ambulatory Visit: Payer: Self-pay | Admitting: Obstetrics and Gynecology

## 2023-09-04 DIAGNOSIS — N952 Postmenopausal atrophic vaginitis: Secondary | ICD-10-CM

## 2023-09-05 NOTE — Telephone Encounter (Signed)
 Spoke to [facility] @ [facility] to have mammogram result faxed to our office.  [facility] phone number: [phone] Date of mammogram 12/12/22

## 2023-09-05 NOTE — Telephone Encounter (Signed)
 Mammogram result given  to Dr. Colvin Dec. M 12/12/22 BI-RADS 0

## 2023-09-05 NOTE — Telephone Encounter (Signed)
 Med refill request: estradiol  0.1 mg vaginal cream Last AEX: 12/17/22  Next AEX: 12/18/23 Last MMG (if hormonal med) 11/29/21 BI-RADS 2 benign Refill authorized: estradiol  0.1 mg vaginal cream.  Please approve or deny as appropriate.

## 2023-09-10 ENCOUNTER — Other Ambulatory Visit: Payer: Self-pay | Admitting: Obstetrics and Gynecology

## 2023-09-10 DIAGNOSIS — N952 Postmenopausal atrophic vaginitis: Secondary | ICD-10-CM

## 2023-09-12 NOTE — Telephone Encounter (Signed)
 Duplicate rx request. Rx was denied on 09/10/23. Patient has incomplete breast imaging so rx has been refused. Routing to provider for review.

## 2023-09-16 NOTE — Telephone Encounter (Signed)
 Contacted Jessica at [facility].   Right breast US  completed on 01/01/2023, no sonographic evidence of malignancy . Copy of report requested.

## 2023-09-17 ENCOUNTER — Other Ambulatory Visit: Payer: Self-pay

## 2023-09-17 DIAGNOSIS — N952 Postmenopausal atrophic vaginitis: Secondary | ICD-10-CM

## 2023-09-17 MED ORDER — ESTRADIOL 0.1 MG/GM VA CREA: TOPICAL_CREAM | VAGINAL | 0 refills | Status: DC

## 2023-09-17 NOTE — Telephone Encounter (Signed)
 Pt called requesting refill. Per note, Dr Colvin Dec was waiting on imaging. It is now scanned in the chart.  Medication refill request: estradiol  cream 0.1 Last OV:  12-17-22 90yr med check Last AEX: 12-14-21 Next AEX: 12-18-23 Last MMG (if hormonal medication request): 01-01-23 unilateral birads 2:neg Refill authorized: please approve if appropriate

## 2023-12-11 ENCOUNTER — Telehealth: Payer: Self-pay | Admitting: Obstetrics and Gynecology

## 2023-12-11 NOTE — Telephone Encounter (Signed)
 Please contact patient in follow up to a staff message I received that she is wanting to schedule her bone density at [facility].  I understand that [facility] is scheduling several months out.   Patient will need to choose a different location.   She could do this at [facility], where she has her mammograms done.

## 2023-12-12 NOTE — Telephone Encounter (Signed)
 Left message to call [facility] Triage at [phone], option 4.

## 2023-12-18 ENCOUNTER — Encounter: Payer: Medicare Other | Admitting: Obstetrics and Gynecology

## 2023-12-18 LAB — HM MAMMOGRAPHY

## 2023-12-26 NOTE — Telephone Encounter (Signed)
 Appointment confirmed with Harlene at [facility]

## 2023-12-26 NOTE — Telephone Encounter (Signed)
 Spoke with patient. Patient request BMD order to schedule at [facility]. Advised will fax order once Dr. Renetta is back in the office on 12/30/23. Number provided to patient for scheduling.   Order printed and to Dr. Nikki to sign to be faxed.

## 2023-12-26 NOTE — Telephone Encounter (Signed)
 Patient left message stating she is scheduled for BMD at [facility] on 01/27/24.

## 2023-12-30 NOTE — Telephone Encounter (Signed)
Order signed and will be faxed today

## 2023-12-30 NOTE — Telephone Encounter (Signed)
 Patient notified. Encounter closed

## 2024-01-07 ENCOUNTER — Encounter: Admitting: Obstetrics and Gynecology

## 2024-01-07 ENCOUNTER — Ambulatory Visit (INDEPENDENT_AMBULATORY_CARE_PROVIDER_SITE_OTHER): Admitting: Obstetrics and Gynecology

## 2024-01-07 ENCOUNTER — Encounter: Payer: Self-pay | Admitting: Obstetrics and Gynecology

## 2024-01-07 ENCOUNTER — Telehealth: Payer: Self-pay | Admitting: Obstetrics and Gynecology

## 2024-01-07 ENCOUNTER — Ambulatory Visit: Payer: Self-pay | Admitting: Obstetrics and Gynecology

## 2024-01-07 VITALS — BP 122/64 | HR 75 | Ht 59.75 in | Wt 120.0 lb

## 2024-01-07 DIAGNOSIS — Z01419 Encounter for gynecological examination (general) (routine) without abnormal findings: Secondary | ICD-10-CM

## 2024-01-07 DIAGNOSIS — M810 Age-related osteoporosis without current pathological fracture: Secondary | ICD-10-CM

## 2024-01-07 DIAGNOSIS — N952 Postmenopausal atrophic vaginitis: Secondary | ICD-10-CM

## 2024-01-07 DIAGNOSIS — Z5181 Encounter for therapeutic drug level monitoring: Secondary | ICD-10-CM

## 2024-01-07 DIAGNOSIS — K5900 Constipation, unspecified: Secondary | ICD-10-CM

## 2024-01-07 DIAGNOSIS — N6311 Unspecified lump in the right breast, upper outer quadrant: Secondary | ICD-10-CM | POA: Diagnosis not present

## 2024-01-07 MED ORDER — ESTRADIOL 0.1 MG/GM VA CREA: TOPICAL_CREAM | VAGINAL | 2 refills | Status: AC

## 2024-01-07 NOTE — Telephone Encounter (Signed)
 Order placed on providers desk for signature.

## 2024-01-07 NOTE — Telephone Encounter (Signed)
 Please schedule dx right mammogram and right breast US  at [facility].   My patient has a 2 cm right breast lump at 10:00.  She just had her routine screening mammogram done.

## 2024-01-07 NOTE — Progress Notes (Signed)
 89 y.o. G63P4 Widowed Caucasian female here for a breast and pelvic exam.    The patient is also followed for vaginal atrophy and is using vaginal estradiol  cream.  Getting up 2 - 3 times a night to void.  No urinary incontinence.  Able to get back to sleep at night.    Has constipation.  Uses Miralax  and has a bowel movement daily. Using a capful daily, which is an increased dose for her.  States her PCP recommended a laxative every few days.     She has osteoporosis and is not currently treating.  Hx osteonecrosis while on Prolia .  She has seen Dr. Von and Dr. Jeannetta in consultation for this.   PCP: Dwight Trula SQUIBB, MD   No LMP recorded. Patient has had a hysterectomy.           Sexually active: No.  The current method of family planning is post menopausal status.    Menopausal hormone therapy:  Estrace   Exercising: uses a rollator. Smoker:  no  OB History     Gravida  4   Para  4   Term      Preterm      AB      Living  4      SAB      IAB      Ectopic      Multiple      Live Births              HEALTH MAINTENANCE: Last 2 paps: 2011 History of abnormal Pap or positive HPV:  no Mammogram:  12/12/22 Breast Density Cat B, incomplete need additional imaging Colonoscopy:  04/06/05 Bone Density:  12/05/21  Result  osteoporosis    Immunization History  Administered Date(s) Administered   Influenza Split 01/19/2010, 12/02/2010, 01/18/2012, 12/29/2012, 01/01/2018, 01/02/2021   Influenza-Unspecified 01/05/2014, 12/19/2015, 01/03/2017, 01/16/2019, 01/19/2020   PFIZER(Purple Top)SARS-COV-2 Vaccination 04/16/2019, 05/07/2019, 01/06/2020   Pneumococcal Conjugate-13 06/28/2014   Pneumococcal Polysaccharide-23 12/31/2004, 01/08/2018   Td 05/15/2004   Tdap 12/07/2014   Zoster, Live 08/21/2011, 07/13/2016, 12/26/2016      reports that she has never smoked. She has been exposed to tobacco smoke. She has never used smokeless tobacco. She reports current  alcohol use of about 3.0 standard drinks of alcohol per week. She reports that she does not use drugs.  Past Medical History:  Diagnosis Date   Hypertension    Hypothyroidism    Osteonecrosis of jaw due to drug 2022   lower right mandible, tooth #30   Osteoporosis, senile 10/2017   T score -3.0.  Overall stable/improved over prior study on Prolia    Vertigo 02/2012   Work up at [facility] for stroke-tests negative, no further problem    Past Surgical History:  Procedure Laterality Date   BACK SURGERY  2006   CYSTOCELE REPAIR N/A 08/19/2012   Procedure: ANTERIOR REPAIR (CYSTOCELE);  Surgeon: Glendia DELENA Elizabeth, MD;  Location: [facility];  Service: Urology;  Laterality: N/A;   CYSTOSCOPY N/A 08/19/2012   Procedure: CYSTOSCOPY;  Surgeon: Glendia DELENA Elizabeth, MD;  Location: [facility];  Service: Urology;  Laterality: N/A;   EYE SURGERY     IR SACROPLASTY BILATERAL  03/15/2021   ROBOTIC ASSISTED LAPAROSCOPIC SACROCOLPOPEXY  2016   [facility] - Dr. Erik   SALPINGOOPHORECTOMY Bilateral 08/19/2012   Procedure: SALPINGO OOPHORECTOMY;  Surgeon: Dickie DELENA Carder, MD;  Location: [facility];  Service: Gynecology;  Laterality: Bilateral;   TONSILLECTOMY     TUBAL  LIGATION     VAGINAL HYSTERECTOMY N/A 08/19/2012   Procedure: HYSTERECTOMY VAGINAL;  Surgeon: Dickie DELENA Carder, MD;  Location: [facility];  Service: Gynecology;  Laterality: N/A;   VAGINAL PROLAPSE REPAIR N/A 08/19/2012   Procedure: VAULT PROLAPSE WITH GRAFT;  Surgeon: Glendia DELENA Elizabeth, MD;  Location: [facility];  Service: Urology;  Laterality: N/A;    Current Outpatient Medications  Medication Sig Dispense Refill   acetaminophen  (TYLENOL ) 500 MG tablet Take 1,000 mg by mouth every 6 (six) hours as needed for mild pain.     Biotin 5000 MCG TABS Take by mouth.     Calcium Carb-Cholecalciferol 600-20 MG-MCG CHEW Chew by mouth.     Calcium-Vitamin D  (CALTRATE 600 PLUS-VIT D PO) Take 1 tablet by mouth 2 (two) times daily.     chlorhexidine  (PERIDEX ) 0.12 %  solution      Cholecalciferol 1000 UNITS tablet Take 1,000 Units by mouth daily.     clobetasol  cream (TEMOVATE ) 0.05 % Apply 1 application topically 2 (two) times daily as needed. 30 g 0   erythromycin ophthalmic ointment      estradiol  (ESTRACE ) 0.1 MG/GM vaginal cream Place 1/2 gram per vagina and a pea size amount to the urethra at bedtime 2 - 3 times per week. 42.5 g 0   felodipine  (PLENDIL ) 10 MG 24 hr tablet Take 5 mg by mouth daily.      finasteride (PROSCAR) 5 MG tablet Take 2.5 mg by mouth daily.     levothyroxine  (SYNTHROID ) 100 MCG tablet Take 1 tablet (100 mcg total) by mouth daily. 90 tablet 3   lisinopril  (PRINIVIL ,ZESTRIL ) 10 MG tablet Take by mouth.     Multiple Vitamins-Minerals (MH MACULAR HEALTH) MISC Take 1 tablet by mouth daily.     nystatin -triamcinolone  ointment (MYCOLOG) Apply 1 application topically 2 (two) times daily. 30 g 0   Polyethyl Glycol-Propyl Glycol (SYSTANE OP) Apply 2 drops to eye daily as needed (for dry eyes.).     polyethylene glycol (MIRALAX  / GLYCOLAX ) packet Take 17 g by mouth daily.     tobramycin-dexamethasone  (TOBRADEX) ophthalmic solution      Abaloparatide  (TYMLOS ) 3120 MCG/1.56ML SOPN Inject 80 mcg daily (Patient not taking: Reported on 01/07/2024) 1.56 mL 5   No current facility-administered medications for this visit.    ALLERGIES: Anesthetics, amide; Percocet [oxycodone -acetaminophen ]; and Sulfa antibiotics  Family History  Problem Relation Age of Onset   Cancer - Colon Mother    Stroke Father     Review of Systems  All other systems reviewed and are negative.   PHYSICAL EXAM:  BP 122/64 (BP Location: Left Arm, Patient Position: Sitting)   Pulse 75   Ht 4' 11.75 (1.518 m)   Wt 120 lb (54.4 kg)   SpO2 97%   BMI 23.63 kg/m     General appearance: alert, cooperative and appears stated age Head: normocephalic, without obvious abnormality, atraumatic Neck: no adenopathy, supple, symmetrical, trachea midline and thyroid  normal to  inspection and palpation Lungs: clear to auscultation bilaterally Breasts: left - normal appearance, no masses or tenderness, nipple retraction present, No nipple discharge or bleeding, No axillary adenopathy, No nipple discharge or bleeding, No axillary adenopathy Right - 2 cm smooth, mobile mass at 10:00, no tenderness, nipple retraction present, No nipple discharge or bleeding, No axillary adenopathy, No nipple discharge or bleeding, No axillary adenopathy Heart: regular rate and rhythm Abdomen: soft, non-tender; no masses, no organomegaly Extremities: extremities normal, atraumatic, no cyanosis or edema Skin: skin color, texture, turgor  normal. No rashes or lesions Lymph nodes: cervical, supraclavicular, and axillary nodes normal. Neurologic: grossly normal  Pelvic: External genitalia:  no lesions              No abnormal inguinal nodes palpated.              Urethra:  urethral prolapse.               Bartholins and Skenes: normal                 Vagina: normal appearing vagina with normal color and discharge, no lesions.  Good support.  Graft palpable but not visible on exam.               Cervix: absent              Pap taken: no Bimanual Exam:  Uterus:  absent              Adnexa: no mass, fullness, tenderness              Rectal exam: yes.  Confirms.              Anus:  normal sphincter tone, no lesions  Chaperone was present for exam:  Kari HERO, CMA  ASSESSMENT: Encounter for breast and pelvic exam.  Status post TVH/BSO, ant colporrhaphy with vaginal vault suspension with graft.  Status post robotic sacrocolpopexy.  Vaginal atrophy. Urethral prolapse. Encounter for medication monitoring.  Bilateral inverted nipples.  Right breast mass.  Osteoporosis. Currently untreated.  Hx sacral fractures and sacroplasty.  Constipation.   PLAN: Dx right mammogram and right breast US .  Self breast awareness reviewed. Pap and HRV collected:  no Guidelines for Calcium, Vitamin D ,  regular exercise program including cardiovascular and weight bearing exercise. Medication refills:  Estrace  cream.   BMD ordered at [facility].  Continue Miralax  and consider daily Colace 100 mg.   Follow up:  1 year.    Additional counseling given.  yes. 35 min  total time was spent for this patient encounter, including preparation, face-to-face counseling with the patient, coordination of care, and documentation of the encounter in addition to doing the breast and pelvic exam.

## 2024-01-07 NOTE — Patient Instructions (Addendum)
 Docusate Capsules or Tablets What is this medication? DOCUSATE (doc CUE sayt) prevents and treats occasional constipation. It works by softening the stool, making it easier to have a bowel movement. It belongs to a group of medications called laxatives. This medicine may be used for other purposes; ask your health care provider or pharmacist if you have questions. COMMON BRAND NAME(S): [employer] Stool Softner, Colace, Colace Clear, Correctol, D.O.S., DC, Doc-Q-Lace, DocuLace, Docusoft S, DOK, DOK Extra Strength, Dulcolax, Dulcolax Pink, Fleet, Genasoft, Kao-Tin, Kaopectate Liqui-Gels, Phillips Stool Softener, Plus PHARMA, Stool Softener, Stool Softener DC, Stool Softener Extra Strength, Sulfolax, Sur-Q-Lax, Surfak, Uni-Ease What should I tell my care team before I take this medication? They need to know if you have any of these conditions: Nausea or vomiting Severe constipation Stomach pain Sudden change in bowel habit lasting more than 2 weeks An unusual or allergic reaction to docusate, other medications, foods, dyes, or preservatives Pregnant or trying to get pregnant Breast-feeding How should I use this medication? Take this medication by mouth with a glass of water. Take it as directed on the label. Do not take it more often than directed. Talk to your care team about the use of this medication in children. While it may be prescribed for children as young as 2 years for selected conditions, precautions do apply. Overdosage: If you think you have taken too much of this medicine contact a poison control center or emergency room at once. NOTE: This medicine is only for you. Do not share this medicine with others. What if I miss a dose? If you miss a dose, take it as soon as you can. If it is almost time for your next dose, take only that dose. Do not take double or extra doses. What may interact with this medication? Mineral oil This list may not describe all possible interactions. Give  your health care provider a list of all the medicines, herbs, non-prescription drugs, or dietary supplements you use. Also tell them if you smoke, drink alcohol, or use illegal drugs. Some items may interact with your medicine. What should I watch for while using this medication? Do not use for more than one week without advice from your care team. If your constipation returns, check with your care team. Drink plenty of water while taking this medication. Drinking water helps decrease constipation. Stop using this medication and contact your care team if you experience any rectal bleeding or do not have a bowel movement after use. These could be signs of a more serious condition. What side effects may I notice from receiving this medication? Side effects that you should report to your care team as soon as possible: Allergic reactions--skin rash, itching, hives, swelling of the face, lips, tongue, or throat Side effects that usually do not require medical attention (report to your care team if they continue or are bothersome): Diarrhea Stomach pain This list may not describe all possible side effects. Call your doctor for medical advice about side effects. You may report side effects to FDA at 1-800-FDA-1088. Where should I keep my medication? Keep out of the reach of children and pets. Store at room temperature between 15 and 30 degrees C (59 and 86 degrees F). Throw away any unused medication after the expiration date. NOTE: This sheet is a summary. It may not cover all possible information. If you have questions about this medicine, talk to your doctor, pharmacist, or health care provider.  2024 Elsevier/Gold Standard (2021-10-19 00:00:00)  EXERCISE AND DIET:  We recommended that you start or continue a regular exercise program for good health. Regular exercise means any activity that makes your heart beat faster and makes you sweat.  We recommend exercising at least 30 minutes per day at least  3 days a week, preferably 4 or 5.  We also recommend a diet low in fat and sugar.  Inactivity, poor dietary choices and obesity can cause diabetes, heart attack, stroke, and kidney damage, among others.    ALCOHOL AND SMOKING:  Women should limit their alcohol intake to no more than 7 drinks/beers/glasses of wine (combined, not each!) per week. Moderation of alcohol intake to this level decreases your risk of breast cancer and liver damage. And of course, no recreational drugs are part of a healthy lifestyle.  And absolutely no smoking or even second hand smoke. Most people know smoking can cause heart and lung diseases, but did you know it also contributes to weakening of your bones? Aging of your skin?  Yellowing of your teeth and nails?  CALCIUM AND VITAMIN D :  Adequate intake of calcium and Vitamin D  are recommended.  The recommendations for exact amounts of these supplements seem to change often, but generally speaking 600 mg of calcium (either carbonate or citrate) and 800 units of Vitamin D  per day seems prudent. Certain women may benefit from higher intake of Vitamin D .  If you are among these women, your doctor will have told you during your visit.    PAP SMEARS:  Pap smears, to check for cervical cancer or precancers,  have traditionally been done yearly, although recent scientific advances have shown that most women can have pap smears less often.  However, every woman still should have a physical exam from her gynecologist every year. It will include a breast check, inspection of the vulva and vagina to check for abnormal growths or skin changes, a visual exam of the cervix, and then an exam to evaluate the size and shape of the uterus and ovaries.  And after 88 years of age, a rectal exam is indicated to check for rectal cancers. We will also provide age appropriate advice regarding health maintenance, like when you should have certain vaccines, screening for sexually transmitted diseases, bone  density testing, colonoscopy, mammograms, etc.   MAMMOGRAMS:  All women over 38 years old should have a yearly mammogram. Many facilities now offer a 3D mammogram, which may cost around $50 extra out of pocket. If possible,  we recommend you accept the option to have the 3D mammogram performed.  It both reduces the number of women who will be called back for extra views which then turn out to be normal, and it is better than the routine mammogram at detecting truly abnormal areas.    COLONOSCOPY:  Colonoscopy to screen for colon cancer is recommended for all women at age 61.  We know, you hate the idea of the prep.  We agree, BUT, having colon cancer and not knowing it is worse!!  Colon cancer so often starts as a polyp that can be seen and removed at colonscopy, which can quite literally save your life!  And if your first colonoscopy is normal and you have no family history of colon cancer, most women don't have to have it again for 10 years.  Once every ten years, you can do something that may end up saving your life, right?  We will be happy to help you get it scheduled when you are ready.  Be sure  to check your insurance coverage so you understand how much it will cost.  It may be covered as a preventative service at no cost, but you should check your particular policy.

## 2024-01-08 NOTE — Telephone Encounter (Signed)
 Spoke with Rose at [place].  Patient scheduled for Right breast Dx MMG and US  on 01/17/24 at 0945.   Patient notified.   Routing to provider for final review. Patient is agreeable to disposition. Will close encounter.

## 2024-01-20 ENCOUNTER — Encounter: Payer: Self-pay | Admitting: Obstetrics and Gynecology

## 2024-01-21 ENCOUNTER — Ambulatory Visit: Payer: Self-pay | Admitting: Obstetrics and Gynecology

## 2024-01-23 ENCOUNTER — Encounter: Admitting: Obstetrics and Gynecology

## 2024-03-23 ENCOUNTER — Ambulatory Visit: Admitting: Family Medicine

## 2024-03-30 ENCOUNTER — Encounter: Payer: Self-pay | Admitting: Family Medicine

## 2024-03-30 ENCOUNTER — Ambulatory Visit: Admitting: Family Medicine

## 2024-03-30 VITALS — BP 140/59 | Ht 59.75 in | Wt 120.0 lb

## 2024-03-30 DIAGNOSIS — E039 Hypothyroidism, unspecified: Secondary | ICD-10-CM

## 2024-03-30 DIAGNOSIS — S32020A Wedge compression fracture of second lumbar vertebra, initial encounter for closed fracture: Secondary | ICD-10-CM | POA: Diagnosis not present

## 2024-03-30 DIAGNOSIS — M8000XA Age-related osteoporosis with current pathological fracture, unspecified site, initial encounter for fracture: Secondary | ICD-10-CM | POA: Diagnosis not present

## 2024-03-30 DIAGNOSIS — Z87311 Personal history of (healed) other pathological fracture: Secondary | ICD-10-CM

## 2024-03-30 DIAGNOSIS — Z9889 Other specified postprocedural states: Secondary | ICD-10-CM | POA: Diagnosis not present

## 2024-03-30 NOTE — Patient Instructions (Addendum)
 Osteoporosis Work-up and Treatment  Labwork to obtain: CMP, CBC with diff, 25-OH Vit D, TSH, PTH, SPEP   Supplementation: Make sure you're getting 1200mg  calcium and 800 IU of vitamin D  daily  Exercise: Strength/resistance-based exercise (i.e. free weights, lifting); weight-based (yoga, tai-chi, pilates)  Medication Options: Tymlos  or Forteo  - these builds bone, are daily injections for 2 years - after you finish this you would go on a medicine that keeps it built up (likely Evista given your prior jaw issues) Evenity - this helps build bone, but given you prior history of jaw issues, this would not be a good option for you

## 2024-03-30 NOTE — Progress Notes (Signed)
 DATE OF VISIT: 03/30/2024        Breanna [name] DOB: 02/28/1931 MRN: [id number]  Discussed the use of AI scribe software for clinical note transcription with the patient, who gave verbal consent to proceed.  History of Present Illness Breanna [name] is a 89 year old female who presents for evaluation of osteoporosis Referred by Dr Beuford at [facility] due to recent acute L2 vertebral compression fracture and hx of prior bilateral sacral insufficiency fractures requiring bilateral sacroplasty Here with daughter today  Acute low back pain and L2 vertebral compression fracture - Sudden onset of severe low back pain localized to the L2 region two days before Thanksgiving 2025 - No preceding trauma; pain began abruptly after lunch during routine household activities - Progressive worsening of symptoms despite heat therapy and three physical therapy sessions - Current inability to raise her legs - Ambulates with a rollator walker - using TLSO prescribed by Dr Beuford  History of sacral insufficiency fractures and sacroplasty - Bilateral sacral insufficiency fractures treated with bilateral sacroplasty two years ago - No recall of other significant prior back injuries or fractures  Osteoporosis and prior pharmacotherapy - Longstanding osteoporosis; most recent DEXA scan in 2023 with T-score of -2.9 - Scheduled for updated DEXA in January 2026 - Received Prolia  injections for five years, discontinued two years ago due to jaw complications including abnormal bone growth and dental issues requiring tooth extraction.  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 - Prior osteoporosis treatments include Reclast  (prior to Prolia ) and Fosamax - No osteoporosis-specific pharmacotherapy in the past two years - Declined daily self-injections for osteoporosis recommended by endocrinology in the past due to concerns about  regimen of daily injection  Current osteoporosis management and physical activity - Continues regular exercise including chair yoga, balance exercises, and walking as advised by another physician - Takes daily calcium and vitamin D  supplements; exact dose unspecified  Dental and jaw complications - Recent dental work; continues to follow with dentist and oral surgeon - 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  - No history of blood clots  Visual impairment - History of macular degeneration - Expresses concern about ability to see medication devices clearly  Prior treatment:  - Prolia  x 5 years - stopped 2 years ago due to jaw issues, spacious of jaw osteonecrosis - Reclast  which she took prior to Prolia  - Fosamax History of Hip, Spine, or Wrist Fracture: YES - acute L2 compression fx -- following with Dr Beuford - hx of bilateral sacroplasty for b/l sacral insufficiency fx Heart disease or stroke: No Cancer: No Kidney Disease: No Gastric/Peptic Ulcer: No Gastric bypass surgery: No Severe GERD: No History of seizures: No Age at Menopause: Late 40s Calcium intake: 1200 mg daily Vitamin D  intake: Daily, but uncertain of dosage Hormone replacement therapy: No Smoking history: Never Alcohol: 1 small glass of wine per week Exercise: Prior to recent L2 compression fracture was doing regular chair yoga, tone and balance, walking up to 2 miles Major dental work in past year: Yes -Prior history of what sounds to be likely 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   Biotin 5000 MCG TABS Take by  mouth.   Calcium Carb-Cholecalciferol 600-20 MG-MCG CHEW Chew by mouth.   Calcium-Vitamin D  (CALTRATE 600 PLUS-VIT D PO) Take 1 tablet by mouth 2 (two) times daily.   chlorhexidine  (PERIDEX ) 0.12 % solution    Cholecalciferol 1000 UNITS tablet Take 1,000 Units by mouth daily.   clobetasol  cream (TEMOVATE ) 0.05 % Apply 1 application topically 2 (two) times daily as needed.   erythromycin ophthalmic ointment    estradiol  (ESTRACE ) 0.1 MG/GM vaginal cream Place 1/2 gram per vagina and a pea size amount to the urethra at bedtime 2 - 3 times per week.   felodipine  (PLENDIL ) 10 MG 24 hr tablet Take 5 mg by mouth daily.    finasteride (PROSCAR) 5 MG tablet Take 2.5 mg by mouth daily.   levothyroxine  (SYNTHROID ) 100 MCG tablet Take 1 tablet (100 mcg total) by mouth daily.   lisinopril  (PRINIVIL ,ZESTRIL ) 10 MG tablet Take by mouth.   Multiple Vitamins-Minerals (MH MACULAR HEALTH) MISC Take 1 tablet by mouth daily.   nystatin -triamcinolone  ointment (MYCOLOG) Apply 1 application topically 2 (two) times daily.   Polyethyl Glycol-Propyl Glycol (SYSTANE OP) Apply 2 drops to eye daily as needed (for dry eyes.).   polyethylene glycol (MIRALAX  / GLYCOLAX ) packet Take 17 g by mouth daily.   tobramycin-dexamethasone  (TOBRADEX) ophthalmic solution    No facility-administered encounter medications on file as of 03/30/2024.    Allergies: is allergic to anesthetics, amide; percocet [oxycodone -acetaminophen ]; and sulfa antibiotics.  Physical Examination: Vitals: BP (!) 140/59   Ht 4' 11.75 (1.518 m)   Wt 120 lb (54.4 kg)   BMI 23.63 kg/m  GENERAL:  Breanna [name] is a 89 y.o. female appearing their stated age, alert and oriented x 3, in no apparent distress.  MSK: Seated comfortably in exam room chair, wearing TLSO, ambulating with the assistance of a rollator NEURO: no focal deficits Psych: Appropriate mood, answering questions appropriately, good insight  Radiology: DEXA scan 12/05/2021 showing:  T-score -2.9 in the right femoral neck  Assessment & Plan Age-Related Osteoporosis with associated pathologic fracture, has acute L2 vertebral compression fracture, also with history of prior sacral insufficiency fractures requiring bilateral sacral plasty.  Previous T-score -2.9.  Meets criteria for severe osteoporosis based on T-score and fracture history Acute L2 vertebral compression fracture with 25% superior end plate height loss due to severe osteoporosis.  No osteoporosis pharmacotherapy for two years due to jaw complications. High fracture risk necessitates anabolic therapy to improve overall bone health and prevent future fracture - Discussed risks/benefits of antiresorptive agents and anabolic agents.  Given the severity of her osteoporosis and recurrent fragility fractures, emphasized need for anabolic therapy.  It sounds as though she was told by an endocrinologist in the past that she would benefit from Tymlos , but was hesitant to proceed with the daily injections.  She would be a candidate for Evenity, except her prior jaw issues which are suspicious for jaw osteonecrosis preclude her from this.  She is not willing to take any potential jaw/tooth risk with Evenity.  Discussed Tymlos  and Forteo  in detail, advised that there is no risk for jaw osteonecrosis, but would have beneficial effects for her overall bone health.  She is aware that this would be a daily injection for 2 years.  I did show her a sample Tymlos  injection pen and discussed administration in the office today.  After detailed discussion, patient and daughter were  in agreement to proceed with Tymlos  or Forteo  pending insurance authorization - Would plan Evista after completing anabolic therapy.  She is not a candidate for Prolia , Reclast , Fosamax due to prior jaw osteonecrosis with Prolia . - Ordered comprehensive osteoporosis laboratory panel to assess for secondary causes and guide therapy.  Lab slip given for CBC, CMP, TSH,  PTH, vitamin D , SPEP.  I will contact her with results once available - Will have my staff initiate insurance approval and mail order process for Tymlos  (abaloparatide ) or Forteo  daily injection for two years, with planned transition to Evista (raloxifene) for maintenance therapy.  Our staff can assist with initial injection training as needed. - Advised continuation of daily calcium and vitamin D  supplementation. - Recommended continuation of regular exercise/physical therapy as tolerated and as directed by Dr Beuford as her compression fracture heals. - Should proceed with DEXA scan as scheduled in January 2026, plan repeat DEXA after anabolic therapy. - plan follow-up every 6-12 months for osteoporosis management. - Conditional plan: If Tymlos /Forteo  is not approved or cost-prohibitive, consider Evista as alternative therapy.  Hypothyroidism - May be contributing to her underlying osteoporosis - Will obtain updated TSH - Will continue her Synthroid  as she is doing - Continue to follow-up with PCP for ongoing thyroid  management   Patient & daughter expressed understanding & agreement with above.  I personally spent a total of 60 minutes in the care of the patient today including preparing to see the patient, getting/reviewing separately obtained history, performing a medically appropriate exam/evaluation, counseling and educating, placing orders, referring and communicating with other health care professionals, documenting clinical information in the EHR, and coordinating care.   Encounter Diagnoses  Name Primary?   Age-related osteoporosis with current pathological fracture, initial encounter Yes   Closed compression fracture of L2 vertebra, initial encounter (HCC)    History of sacroplasty    Hypothyroidism, unspecified type     Orders Placed This Encounter  Procedures   Comp Met (CMET)   CBC w/Diff   VITAMIN D  25 Hydroxy (Vit-D Deficiency, Fractures)   TSH   PTH, intact (no Ca)    Protein Electrophoresis, (serum)     Contains text generated by [employer].

## 2024-03-31 ENCOUNTER — Other Ambulatory Visit: Payer: Self-pay | Admitting: *Deleted

## 2024-03-31 ENCOUNTER — Telehealth: Payer: Self-pay

## 2024-03-31 ENCOUNTER — Ambulatory Visit: Payer: Self-pay | Admitting: Family Medicine

## 2024-03-31 ENCOUNTER — Other Ambulatory Visit: Payer: Self-pay

## 2024-03-31 DIAGNOSIS — M810 Age-related osteoporosis without current pathological fracture: Secondary | ICD-10-CM

## 2024-03-31 MED ORDER — TYMLOS 3120 MCG/1.56ML ~~LOC~~ SOPN
80.0000 ug | PEN_INJECTOR | Freq: Every day | SUBCUTANEOUS | 12 refills | Status: AC
  Filled 2024-05-04: qty 1.56, 28d supply, fill #0

## 2024-03-31 NOTE — Progress Notes (Signed)
 Normal PTH. SPEP is pending Other labs reviewed Will reach out with final results once SPEP results received

## 2024-03-31 NOTE — Telephone Encounter (Signed)
 Pharmacy Patient Advocate Encounter   Received notification from Physician's Office that prior authorization for Tymlos  is required/requested.   Insurance verification completed.   The patient is insured through [employer].   Per test claim: PA required; PA submitted to above mentioned insurance via Latent Key/confirmation #/EOC [id number] Status is pending

## 2024-04-01 LAB — CBC WITH DIFFERENTIAL/PLATELET
Basophils Absolute: 0 x10E3/uL (ref 0.0–0.2)
Basos: 1 %
EOS (ABSOLUTE): 0.2 x10E3/uL (ref 0.0–0.4)
Eos: 3 %
Hematocrit: 30.9 % — ABNORMAL LOW (ref 34.0–46.6)
Hemoglobin: 10.4 g/dL — ABNORMAL LOW (ref 11.1–15.9)
Immature Grans (Abs): 0 x10E3/uL (ref 0.0–0.1)
Immature Granulocytes: 0 %
Lymphocytes Absolute: 2.1 x10E3/uL (ref 0.7–3.1)
Lymphs: 31 %
MCH: 33.2 pg — ABNORMAL HIGH (ref 26.6–33.0)
MCHC: 33.7 g/dL (ref 31.5–35.7)
MCV: 99 fL — ABNORMAL HIGH (ref 79–97)
Monocytes Absolute: 0.7 x10E3/uL (ref 0.1–0.9)
Monocytes: 11 %
Neutrophils Absolute: 3.5 x10E3/uL (ref 1.4–7.0)
Neutrophils: 54 %
Platelets: 302 x10E3/uL (ref 150–450)
RBC: 3.13 x10E6/uL — ABNORMAL LOW (ref 3.77–5.28)
RDW: 20.7 % — ABNORMAL HIGH (ref 11.7–15.4)
WBC: 6.5 x10E3/uL (ref 3.4–10.8)

## 2024-04-01 LAB — PROTEIN ELECTROPHORESIS, SERUM
A/G Ratio: 1.2 (ref 0.7–1.7)
Albumin ELP: 3.5 g/dL (ref 2.9–4.4)
Alpha 1: 0.3 g/dL (ref 0.0–0.4)
Alpha 2: 0.7 g/dL (ref 0.4–1.0)
Beta: 1 g/dL (ref 0.7–1.3)
Gamma Globulin: 1 g/dL (ref 0.4–1.8)
Globulin, Total: 2.9 g/dL (ref 2.2–3.9)

## 2024-04-01 LAB — TSH: TSH: 3.03 u[IU]/mL (ref 0.450–4.500)

## 2024-04-01 LAB — COMPREHENSIVE METABOLIC PANEL WITH GFR
ALT: 14 IU/L (ref 0–32)
AST: 25 IU/L (ref 0–40)
Albumin: 4.1 g/dL (ref 3.6–4.6)
Alkaline Phosphatase: 122 IU/L (ref 48–129)
BUN/Creatinine Ratio: 36 — ABNORMAL HIGH (ref 12–28)
BUN: 28 mg/dL (ref 10–36)
Bilirubin Total: 0.3 mg/dL (ref 0.0–1.2)
CO2: 25 mmol/L (ref 20–29)
Calcium: 9.2 mg/dL (ref 8.7–10.3)
Chloride: 105 mmol/L (ref 96–106)
Creatinine, Ser: 0.78 mg/dL (ref 0.57–1.00)
Globulin, Total: 2.3 g/dL (ref 1.5–4.5)
Glucose: 91 mg/dL (ref 70–99)
Potassium: 4.5 mmol/L (ref 3.5–5.2)
Sodium: 141 mmol/L (ref 134–144)
Total Protein: 6.4 g/dL (ref 6.0–8.5)
eGFR: 72 mL/min/1.73

## 2024-04-01 LAB — VITAMIN D 25 HYDROXY (VIT D DEFICIENCY, FRACTURES): Vit D, 25-Hydroxy: 46.4 ng/mL (ref 30.0–100.0)

## 2024-04-01 LAB — PARATHYROID HORMONE, INTACT (NO CA): PTH: 38 pg/mL (ref 15–65)

## 2024-04-01 NOTE — Progress Notes (Signed)
 All labs reviewed.  BMP unremarkable.  Vitamin D , PTH, TSH, SPEP all normal.  CBC showing stable anemia.  Look to patient on the phone 04/01/2024 at 13:59 EST.  Reviewed results.  Also advised that we are awaiting insurance authorization for Tymlos .  Will reach out once we hear from the insurance company.  She will continue her current medications and regimen as prescribed.  All questions answered.

## 2024-04-03 ENCOUNTER — Other Ambulatory Visit: Payer: Self-pay

## 2024-04-03 NOTE — Telephone Encounter (Signed)
 Plan requires new PA starting 04/02/24.  Pharmacy Patient Advocate Encounter   Received notification from Physician's Office that prior authorization for Tymlos  is required/requested.   Insurance verification completed.   The patient is insured through [employer].   Per test claim: PA required; PA submitted to above mentioned insurance via Latent Key/confirmation #/EOC PETER KIEWIT SONS Status is pending

## 2024-04-06 ENCOUNTER — Other Ambulatory Visit: Payer: Self-pay

## 2024-04-06 ENCOUNTER — Other Ambulatory Visit (HOSPITAL_COMMUNITY): Payer: Self-pay

## 2024-04-07 ENCOUNTER — Other Ambulatory Visit: Payer: Self-pay

## 2024-04-07 NOTE — Telephone Encounter (Signed)
 Pharmacy Patient Advocate Encounter  Received notification from [employer] that Prior Authorization for Tymlos  has been APPROVED from 04/02/24 to 04/01/25. Ran test claim, Copay is $150. This test claim was processed through [facility]- copay amounts may vary at other pharmacies due to pharmacy/plan contracts, or as the patient moves through the different stages of their insurance plan.   PA #/Case ID/Reference #: [id number]

## 2024-04-09 ENCOUNTER — Other Ambulatory Visit: Payer: Self-pay

## 2024-04-09 NOTE — Progress Notes (Signed)
 Pharmacy Patient Advocate Encounter  Insurance verification completed.   The patient is insured through [facility]   Ran test claim for Tymlos . Co-pay is $150.  This test claim was processed through [facility]- copay amounts may vary at other pharmacies due to pharmacy/plan contracts, or as the patient moves through the different stages of their insurance plan.

## 2024-04-13 ENCOUNTER — Other Ambulatory Visit: Payer: Self-pay

## 2024-04-14 ENCOUNTER — Other Ambulatory Visit: Payer: Self-pay

## 2024-04-14 ENCOUNTER — Telehealth: Payer: Self-pay | Admitting: *Deleted

## 2024-04-14 NOTE — Progress Notes (Signed)
 Office is going to provide sample and injection training today. She will be good to have med sent to home in February

## 2024-04-14 NOTE — Telephone Encounter (Signed)
 Tymlos  1.37ml sample pen given 80mcg given Tolstoy lower right abdomen Radius Onu:[id number] Exp: 2027-May  Pt and daughter educated on daily administration of medication.   1 box of 5mm pen needles given YPSOMED LOT: [id number] EXP: 2026-07-31

## 2024-04-24 LAB — HM DEXA SCAN

## 2024-04-27 ENCOUNTER — Encounter: Payer: Self-pay | Admitting: Obstetrics and Gynecology

## 2024-04-28 ENCOUNTER — Ambulatory Visit: Payer: Self-pay | Admitting: Obstetrics and Gynecology

## 2024-05-01 ENCOUNTER — Other Ambulatory Visit: Payer: Self-pay

## 2024-05-04 ENCOUNTER — Other Ambulatory Visit: Payer: Self-pay

## 2024-05-07 ENCOUNTER — Other Ambulatory Visit: Payer: Self-pay

## 2025-01-12 ENCOUNTER — Encounter: Admitting: Obstetrics and Gynecology
